# Patient Record
Sex: Male | Born: 1960
Health system: Southern US, Community
[De-identification: ages and names within clinical notes are randomized; demographics above are authoritative.]

## PROBLEM LIST (undated history)

## (undated) DIAGNOSIS — I10 Essential (primary) hypertension: Secondary | ICD-10-CM

## (undated) DIAGNOSIS — R109 Unspecified abdominal pain: Secondary | ICD-10-CM

## (undated) DIAGNOSIS — L84 Corns and callosities: Secondary | ICD-10-CM

## (undated) DIAGNOSIS — Z79899 Other long term (current) drug therapy: Secondary | ICD-10-CM

## (undated) DIAGNOSIS — IMO0002 Reserved for concepts with insufficient information to code with codable children: Secondary | ICD-10-CM

## (undated) DIAGNOSIS — K5792 Diverticulitis of intestine, part unspecified, without perforation or abscess without bleeding: Secondary | ICD-10-CM

## (undated) DIAGNOSIS — D72825 Bandemia: Secondary | ICD-10-CM

## (undated) DIAGNOSIS — H269 Unspecified cataract: Secondary | ICD-10-CM

## (undated) DIAGNOSIS — F1010 Alcohol abuse, uncomplicated: Secondary | ICD-10-CM

## (undated) DIAGNOSIS — M21611 Bunion of right foot: Secondary | ICD-10-CM

## (undated) DIAGNOSIS — M21612 Bunion of left foot: Secondary | ICD-10-CM

## (undated) DIAGNOSIS — A530 Latent syphilis, unspecified as early or late: Secondary | ICD-10-CM

## (undated) DIAGNOSIS — J309 Allergic rhinitis, unspecified: Secondary | ICD-10-CM

## (undated) DIAGNOSIS — R809 Proteinuria, unspecified: Secondary | ICD-10-CM

## (undated) DIAGNOSIS — I639 Cerebral infarction, unspecified: Principal | ICD-10-CM

## (undated) DIAGNOSIS — I4949 Other premature depolarization: Secondary | ICD-10-CM

## (undated) DIAGNOSIS — I219 Acute myocardial infarction, unspecified: Secondary | ICD-10-CM

## (undated) DIAGNOSIS — R011 Cardiac murmur, unspecified: Secondary | ICD-10-CM

## (undated) DIAGNOSIS — B353 Tinea pedis: Secondary | ICD-10-CM

## (undated) DIAGNOSIS — I63441 Cerebral infarction due to embolism of right cerebellar artery: Secondary | ICD-10-CM

## (undated) DIAGNOSIS — E7800 Pure hypercholesterolemia, unspecified: Secondary | ICD-10-CM

## (undated) DIAGNOSIS — I440 Atrioventricular block, first degree: Secondary | ICD-10-CM

## (undated) DIAGNOSIS — E291 Testicular hypofunction: Secondary | ICD-10-CM

## (undated) DIAGNOSIS — Z720 Tobacco use: Secondary | ICD-10-CM

## (undated) DIAGNOSIS — K922 Gastrointestinal hemorrhage, unspecified: Secondary | ICD-10-CM

## (undated) DIAGNOSIS — E119 Type 2 diabetes mellitus without complications: Secondary | ICD-10-CM

## (undated) DIAGNOSIS — E559 Vitamin D deficiency, unspecified: Secondary | ICD-10-CM

## (undated) DIAGNOSIS — B351 Tinea unguium: Secondary | ICD-10-CM

## (undated) HISTORY — DX: Cerebral infarction, unspecified: I63.9

## (undated) HISTORY — PX: PACEMAKER IMPLANT: EP1218

## (undated) HISTORY — DX: Tinea unguium: B35.1

## (undated) HISTORY — DX: Cardiac murmur, unspecified: R01.1

## (undated) HISTORY — DX: Atrioventricular block, first degree: I44.0

## (undated) HISTORY — DX: Latent syphilis, unspecified as early or late: A53.0

## (undated) HISTORY — DX: Pure hypercholesterolemia, unspecified: E78.00

## (undated) HISTORY — DX: Allergic rhinitis, unspecified: J30.9

## (undated) HISTORY — DX: Acute myocardial infarction, unspecified: I21.9

## (undated) HISTORY — DX: Bandemia: D72.825

## (undated) HISTORY — DX: Tinea pedis: B35.3

## (undated) HISTORY — DX: Other premature depolarization: I49.49

## (undated) HISTORY — DX: Corns and callosities: L84

## (undated) HISTORY — PX: BACK SURGERY: SHX140

## (undated) HISTORY — DX: Reserved for concepts with insufficient information to code with codable children: IMO0002

## (undated) HISTORY — DX: Diverticulitis of intestine, part unspecified, without perforation or abscess without bleeding: K57.92

## (undated) HISTORY — DX: Unspecified cataract: H26.9

## (undated) HISTORY — DX: Cerebral infarction due to embolism of right cerebellar artery: I63.441

## (undated) HISTORY — DX: Gastrointestinal hemorrhage, unspecified: K92.2

## (undated) HISTORY — DX: Testicular hypofunction: E29.1

## (undated) HISTORY — DX: Type 2 diabetes mellitus without complications: E11.9

## (undated) HISTORY — DX: Alcohol abuse, uncomplicated: F10.10

## (undated) HISTORY — DX: Other long term (current) drug therapy: Z79.899

## (undated) HISTORY — DX: Bunion of left foot: M21.612

## (undated) HISTORY — DX: Proteinuria, unspecified: R80.9

## (undated) HISTORY — DX: Bunion of right foot: M21.611

## (undated) HISTORY — DX: Unspecified abdominal pain: R10.9

## (undated) HISTORY — DX: Essential (primary) hypertension: I10

## (undated) HISTORY — DX: Tobacco use: Z72.0

## (undated) HISTORY — DX: Vitamin D deficiency, unspecified: E55.9

---

## 2011-03-25 HISTORY — PX: COLONOSCOPY: SHX174

## 2011-12-01 DIAGNOSIS — Z86010 Personal history of colonic polyps: Secondary | ICD-10-CM | POA: Insufficient documentation

## 2012-04-20 DIAGNOSIS — R011 Cardiac murmur, unspecified: Secondary | ICD-10-CM | POA: Insufficient documentation

## 2012-04-20 HISTORY — DX: Cardiac murmur, unspecified: R01.1

## 2012-08-28 DIAGNOSIS — I4949 Other premature depolarization: Secondary | ICD-10-CM

## 2012-08-28 DIAGNOSIS — I440 Atrioventricular block, first degree: Secondary | ICD-10-CM

## 2012-08-28 DIAGNOSIS — Z79899 Other long term (current) drug therapy: Secondary | ICD-10-CM

## 2012-08-28 DIAGNOSIS — J309 Allergic rhinitis, unspecified: Secondary | ICD-10-CM

## 2012-08-28 DIAGNOSIS — L84 Corns and callosities: Secondary | ICD-10-CM

## 2012-08-28 DIAGNOSIS — B353 Tinea pedis: Secondary | ICD-10-CM

## 2012-08-28 HISTORY — DX: Other long term (current) drug therapy: Z79.899

## 2012-08-28 HISTORY — DX: Allergic rhinitis, unspecified: J30.9

## 2012-08-28 HISTORY — DX: Tinea pedis: B35.3

## 2012-08-28 HISTORY — DX: Other premature depolarization: I49.49

## 2012-08-28 HISTORY — DX: Atrioventricular block, first degree: I44.0

## 2012-08-28 HISTORY — DX: Corns and callosities: L84

## 2013-04-13 DIAGNOSIS — E7800 Pure hypercholesterolemia, unspecified: Secondary | ICD-10-CM

## 2013-04-13 DIAGNOSIS — E291 Testicular hypofunction: Secondary | ICD-10-CM | POA: Insufficient documentation

## 2013-04-13 DIAGNOSIS — E559 Vitamin D deficiency, unspecified: Secondary | ICD-10-CM | POA: Insufficient documentation

## 2013-04-13 DIAGNOSIS — R809 Proteinuria, unspecified: Secondary | ICD-10-CM

## 2013-04-13 HISTORY — DX: Testicular hypofunction: E29.1

## 2013-04-13 HISTORY — DX: Pure hypercholesterolemia, unspecified: E78.00

## 2013-04-13 HISTORY — DX: Proteinuria, unspecified: R80.9

## 2013-04-13 HISTORY — DX: Vitamin D deficiency, unspecified: E55.9

## 2013-11-16 DIAGNOSIS — E291 Testicular hypofunction: Secondary | ICD-10-CM

## 2013-11-16 HISTORY — DX: Testicular hypofunction: E29.1

## 2014-01-03 DIAGNOSIS — H269 Unspecified cataract: Secondary | ICD-10-CM | POA: Insufficient documentation

## 2014-01-03 HISTORY — DX: Unspecified cataract: H26.9

## 2014-12-18 DIAGNOSIS — B351 Tinea unguium: Secondary | ICD-10-CM | POA: Insufficient documentation

## 2014-12-18 DIAGNOSIS — B353 Tinea pedis: Secondary | ICD-10-CM

## 2014-12-18 DIAGNOSIS — M21611 Bunion of right foot: Secondary | ICD-10-CM | POA: Insufficient documentation

## 2014-12-18 DIAGNOSIS — M21612 Bunion of left foot: Secondary | ICD-10-CM

## 2014-12-18 HISTORY — DX: Tinea unguium: B35.1

## 2014-12-18 HISTORY — DX: Tinea pedis: B35.3

## 2014-12-18 HISTORY — DX: Bunion of right foot: M21.611

## 2016-04-08 ENCOUNTER — Encounter (HOSPITAL_BASED_OUTPATIENT_CLINIC_OR_DEPARTMENT_OTHER): Payer: Self-pay | Admitting: Emergency Medicine

## 2016-04-08 ENCOUNTER — Emergency Department (HOSPITAL_BASED_OUTPATIENT_CLINIC_OR_DEPARTMENT_OTHER)
Admission: EM | Admit: 2016-04-08 | Discharge: 2016-04-08 | Disposition: A | Discharge status: Home / Self Care | Payer: Self-pay | Attending: Emergency Medicine | Admitting: Emergency Medicine

## 2016-04-08 DIAGNOSIS — R1030 Lower abdominal pain, unspecified: Secondary | ICD-10-CM | POA: Insufficient documentation

## 2016-04-08 DIAGNOSIS — Z79899 Other long term (current) drug therapy: Secondary | ICD-10-CM | POA: Insufficient documentation

## 2016-04-08 DIAGNOSIS — R109 Unspecified abdominal pain: Secondary | ICD-10-CM

## 2016-04-08 DIAGNOSIS — Z7984 Long term (current) use of oral hypoglycemic drugs: Secondary | ICD-10-CM | POA: Insufficient documentation

## 2016-04-08 DIAGNOSIS — E119 Type 2 diabetes mellitus without complications: Secondary | ICD-10-CM | POA: Insufficient documentation

## 2016-04-08 DIAGNOSIS — F17210 Nicotine dependence, cigarettes, uncomplicated: Secondary | ICD-10-CM | POA: Insufficient documentation

## 2016-04-08 DIAGNOSIS — I10 Essential (primary) hypertension: Secondary | ICD-10-CM | POA: Insufficient documentation

## 2016-04-08 HISTORY — DX: Type 2 diabetes mellitus without complications: E11.9

## 2016-04-08 HISTORY — DX: Essential (primary) hypertension: I10

## 2016-04-08 HISTORY — DX: Pure hypercholesterolemia, unspecified: E78.00

## 2016-04-08 LAB — COMPREHENSIVE METABOLIC PANEL
ALK PHOS: 104 U/L (ref 38–126)
ALT: 15 U/L — AB (ref 17–63)
AST: 18 U/L (ref 15–41)
Albumin: 4.1 g/dL (ref 3.5–5.0)
Anion gap: 11 (ref 5–15)
BILIRUBIN TOTAL: 0.6 mg/dL (ref 0.3–1.2)
BUN: 9 mg/dL (ref 6–20)
CALCIUM: 9.9 mg/dL (ref 8.9–10.3)
CO2: 23 mmol/L (ref 22–32)
Chloride: 100 mmol/L — ABNORMAL LOW (ref 101–111)
Creatinine, Ser: 0.88 mg/dL (ref 0.61–1.24)
GFR calc Af Amer: >60 mL/min (ref >60)
Glucose, Bld: 345 mg/dL — ABNORMAL HIGH (ref 65–99)
Potassium: 3.6 mmol/L (ref 3.5–5.1)
Sodium: 134 mmol/L — ABNORMAL LOW (ref 135–145)
Total Protein: 8.3 g/dL — ABNORMAL HIGH (ref 6.5–8.1)

## 2016-04-08 LAB — CBC
HCT: 44.6 % (ref 39.0–52.0)
Hemoglobin: 15.6 g/dL (ref 13.0–17.0)
MCH: 29.2 pg (ref 26.0–34.0)
MCHC: 35 g/dL (ref 30.0–36.0)
MCV: 83.4 fL (ref 78.0–100.0)
PLATELETS: 276 10*3/uL (ref 150–400)
RBC: 5.35 MIL/uL (ref 4.22–5.81)
RDW: 13.2 % (ref 11.5–15.5)
WBC: 9.2 10*3/uL (ref 4.0–10.5)

## 2016-04-08 LAB — URINALYSIS, ROUTINE W REFLEX MICROSCOPIC
BILIRUBIN URINE: NEGATIVE
Glucose, UA: >=500 mg/dL — AB
HGB URINE DIPSTICK: NEGATIVE
Ketones, ur: 15 mg/dL — AB
Leukocytes, UA: NEGATIVE
Nitrite: NEGATIVE
PH: 5.5 (ref 5.0–8.0)
Protein, ur: 30 mg/dL — AB

## 2016-04-08 LAB — URINALYSIS, MICROSCOPIC (REFLEX)
RBC / HPF: NONE SEEN RBC/hpf (ref 0–5)
WBC, UA: NONE SEEN WBC/hpf (ref 0–5)

## 2016-04-08 LAB — LIPASE, BLOOD: Lipase: 29 U/L (ref 11–51)

## 2016-04-08 MED ORDER — DICYCLOMINE HCL 20 MG PO TABS: 20.0000 mg | ORAL_TABLET | Freq: Two times a day (BID) | ORAL | 0 refills | Status: DC

## 2016-04-08 MED ORDER — DICYCLOMINE HCL 10 MG PO CAPS
10.0000 mg | ORAL_CAPSULE | Freq: Once | ORAL | Status: AC
  Administered 2016-04-08: 10 mg via ORAL
  Filled 2016-04-08: qty 1

## 2016-04-08 MED FILL — DICYCLOMINE 20 MG TABLET: 20 | 10 days supply | Qty: 20 | Fill #0

## 2016-04-08 NOTE — ED Provider Notes (Signed)
[facility] DEPT [facility] Provider Note   CSN: [id number] Arrival date & time: 04/08/16  1017   History   Chief Complaint Chief Complaint  Patient presents with  . Abdominal Pain    HPI Caleb Hernandez is a 56 y.o. male.  HPI   Patient has PMH of diabetes, high cholesterol and hypertension as well as of hx of back surgery to the ER for evaluation of abdominal pain. The pain has been intermittent lower abdominal the cramping pain is illicit ed only with laying on his left or right side. Patient is laying in stretcher with legs crossed changing the channels on the television while we talk. He says he is currently having no pain. It has been going on for the past 2 days. He has not been having any nausea, vomiting. Constipation, bloating, le swelling, back pain, testicular pain, weight loss, night sweats, abnormal bleeding or diarrhea. He has not been having any urinary symptoms.  Past Medical History:  Diagnosis Date  . Diabetes mellitus without complication ([place])   . High cholesterol   . Hypertension     There are no active problems to display for this patient.  Past Surgical History:  Procedure Laterality Date  . BACK SURGERY      Home Medications    Prior to Admission medications   Medication Sig Start Date End Date Taking? Authorizing Provider  metFORMIN (GLUCOPHAGE) 500 MG tablet Take 500 mg by mouth 2 (two) times daily with a meal.   Yes Historical Provider, MD  dicyclomine (BENTYL) 20 MG tablet Take 1 tablet (20 mg total) by mouth 2 (two) times daily. 04/08/16   Delos Haring, PA-C    Family History No family history on file.  Social History Social History  Substance Use Topics  . Smoking status: Current Every Day Smoker  . Smokeless tobacco: Never Used  . Alcohol use Yes     Comment: 40 oz per day   Allergies   Patient has no known allergies.   Review of Systems Review of Systems Review of Systems All other systems negative except as documented in the  HPI. All pertinent positives and negatives as reviewed in the HPI.   Physical Exam Updated Vital Signs BP 155/98 (BP Location: Left Arm)   Pulse 104   Temp 97.8 F (36.6 C) (Oral)   Resp 18   Ht 5\' 4"  (1.626 m)   Wt 71.7 kg   SpO2 97%   BMI 27.12 kg/m   Physical Exam  Constitutional: He is oriented to person, place, and time. He appears well-developed and well-nourished.  HENT:  Head: Normocephalic and atraumatic.  Eyes: EOM are normal. Pupils are equal, round, and reactive to light.  Neck: Normal range of motion.  Cardiovascular: Normal rate and regular rhythm.   Pulmonary/Chest: Effort normal and breath sounds normal.  Abdominal: Soft. Normal appearance and bowel sounds are normal. There is no tenderness. There is no rigidity, no rebound, no guarding, no CVA tenderness, no tenderness at McBurney's point and negative Murphy's sign.  Musculoskeletal: Normal range of motion.  Neurological: He is alert and oriented to person, place, and time.  Skin: Skin is warm and dry.    ED Treatments / Results  Labs (all labs ordered are listed, but only abnormal results are displayed) Labs Reviewed  URINALYSIS, ROUTINE W REFLEX MICROSCOPIC - Abnormal; Notable for the following:       Result Value   Specific Gravity, Urine >1.046 (*)    Glucose, UA >=500 (*)  Ketones, ur 15 (*)    Protein, ur 30 (*)    All other components within normal limits  COMPREHENSIVE METABOLIC PANEL - Abnormal; Notable for the following:    Sodium 134 (*)    Chloride 100 (*)    Glucose, Bld 345 (*)    Total Protein 8.3 (*)    ALT 15 (*)    All other components within normal limits  URINALYSIS, MICROSCOPIC (REFLEX) - Abnormal; Notable for the following:    Bacteria, UA RARE (*)    Squamous Epithelial / LPF 0-5 (*)    All other components within normal limits  LIPASE, BLOOD  CBC    EKG  EKG Interpretation None       Radiology No results found.  Procedures Procedures (including critical care  time)  Medications Ordered in ED Medications  dicyclomine (BENTYL) capsule 10 mg (10 mg Oral Given 04/08/16 1101)     Initial Impression / Assessment and Plan / ED Course  I have reviewed the triage vital signs and the nursing notes.  Pertinent labs & imaging results that were available during my care of the patient were reviewed by me and considered in my medical decision making (see chart for details).  Clinical Course     Patient is nontoxic, nonseptic appearing, in no apparent distress.  Patient's pain and other symptoms adequately managed in emergency department.  Labs, imaging and vitals reviewed. I do not feel that the patient needs imaging at this time, he is not currently having any pain. Patient does not meet the SIRS or Sepsis criteria.  On repeat exam patient does not have a surgical abdomin and there are no peritoneal signs.  No indication of appendicitis, bowel obstruction, bowel perforation, cholecystitis, diverticulitis.  Patient discharged home with symptomatic treatment and given strict instructions for follow-up with their primary care physician.  I have also discussed reasons to return immediately to the ER.  Patient expresses understanding and agrees with plan.     Final Clinical Impressions(s) / ED Diagnoses   Final diagnoses:  Abdominal cramping    New Prescriptions New Prescriptions   DICYCLOMINE (BENTYL) 20 MG TABLET    Take 1 tablet (20 mg total) by mouth 2 (two) times daily.     Delos Haring, PA-C 04/08/16 [place], MD 04/08/16 [phone]

## 2016-04-08 NOTE — ED Triage Notes (Signed)
Pt reports intermittent lower abd pain x 3 days. Denies N/V/D. Denies urinary symptoms.

## 2016-09-17 ENCOUNTER — Inpatient Hospital Stay (HOSPITAL_COMMUNITY): Payer: Self-pay

## 2016-09-17 ENCOUNTER — Inpatient Hospital Stay (HOSPITAL_BASED_OUTPATIENT_CLINIC_OR_DEPARTMENT_OTHER)
Admission: EM | Admit: 2016-09-17 | Discharge: 2016-09-20 | DRG: 392 | Disposition: A | Discharge status: Home / Self Care | Payer: Self-pay | Attending: Internal Medicine | Admitting: Internal Medicine

## 2016-09-17 ENCOUNTER — Encounter (HOSPITAL_BASED_OUTPATIENT_CLINIC_OR_DEPARTMENT_OTHER): Payer: Self-pay | Admitting: Emergency Medicine

## 2016-09-17 ENCOUNTER — Emergency Department (HOSPITAL_BASED_OUTPATIENT_CLINIC_OR_DEPARTMENT_OTHER): Payer: Self-pay

## 2016-09-17 DIAGNOSIS — K922 Gastrointestinal hemorrhage, unspecified: Secondary | ICD-10-CM | POA: Diagnosis present

## 2016-09-17 DIAGNOSIS — R109 Unspecified abdominal pain: Secondary | ICD-10-CM | POA: Diagnosis present

## 2016-09-17 DIAGNOSIS — K5792 Diverticulitis of intestine, part unspecified, without perforation or abscess without bleeding: Secondary | ICD-10-CM

## 2016-09-17 DIAGNOSIS — Z79899 Other long term (current) drug therapy: Secondary | ICD-10-CM

## 2016-09-17 DIAGNOSIS — Z720 Tobacco use: Secondary | ICD-10-CM

## 2016-09-17 DIAGNOSIS — K921 Melena: Secondary | ICD-10-CM | POA: Diagnosis present

## 2016-09-17 DIAGNOSIS — I4510 Unspecified right bundle-branch block: Secondary | ICD-10-CM | POA: Diagnosis present

## 2016-09-17 DIAGNOSIS — I10 Essential (primary) hypertension: Secondary | ICD-10-CM | POA: Diagnosis present

## 2016-09-17 DIAGNOSIS — Z794 Long term (current) use of insulin: Secondary | ICD-10-CM

## 2016-09-17 DIAGNOSIS — K5793 Diverticulitis of intestine, part unspecified, without perforation or abscess with bleeding: Secondary | ICD-10-CM

## 2016-09-17 DIAGNOSIS — E876 Hypokalemia: Secondary | ICD-10-CM | POA: Diagnosis present

## 2016-09-17 DIAGNOSIS — F1010 Alcohol abuse, uncomplicated: Secondary | ICD-10-CM

## 2016-09-17 DIAGNOSIS — F17210 Nicotine dependence, cigarettes, uncomplicated: Secondary | ICD-10-CM | POA: Diagnosis present

## 2016-09-17 DIAGNOSIS — R066 Hiccough: Secondary | ICD-10-CM

## 2016-09-17 DIAGNOSIS — E118 Type 2 diabetes mellitus with unspecified complications: Secondary | ICD-10-CM

## 2016-09-17 DIAGNOSIS — I498 Other specified cardiac arrhythmias: Secondary | ICD-10-CM

## 2016-09-17 DIAGNOSIS — R1084 Generalized abdominal pain: Secondary | ICD-10-CM

## 2016-09-17 DIAGNOSIS — K5732 Diverticulitis of large intestine without perforation or abscess without bleeding: Principal | ICD-10-CM | POA: Diagnosis present

## 2016-09-17 DIAGNOSIS — K7010 Alcoholic hepatitis without ascites: Secondary | ICD-10-CM | POA: Diagnosis present

## 2016-09-17 DIAGNOSIS — D696 Thrombocytopenia, unspecified: Secondary | ICD-10-CM | POA: Diagnosis present

## 2016-09-17 DIAGNOSIS — K769 Liver disease, unspecified: Secondary | ICD-10-CM

## 2016-09-17 DIAGNOSIS — R740 Nonspecific elevation of levels of transaminase and lactic acid dehydrogenase [LDH]: Secondary | ICD-10-CM

## 2016-09-17 DIAGNOSIS — E119 Type 2 diabetes mellitus without complications: Secondary | ICD-10-CM

## 2016-09-17 DIAGNOSIS — R7401 Elevation of levels of liver transaminase levels: Secondary | ICD-10-CM

## 2016-09-17 DIAGNOSIS — I441 Atrioventricular block, second degree: Secondary | ICD-10-CM | POA: Diagnosis present

## 2016-09-17 DIAGNOSIS — K5733 Diverticulitis of large intestine without perforation or abscess with bleeding: Secondary | ICD-10-CM

## 2016-09-17 DIAGNOSIS — R112 Nausea with vomiting, unspecified: Secondary | ICD-10-CM

## 2016-09-17 HISTORY — DX: Alcohol abuse, uncomplicated: F10.10

## 2016-09-17 HISTORY — DX: Type 2 diabetes mellitus without complications: E11.9

## 2016-09-17 HISTORY — DX: Diverticulitis of intestine, part unspecified, without perforation or abscess without bleeding: K57.92

## 2016-09-17 HISTORY — DX: Gastrointestinal hemorrhage, unspecified: K92.2

## 2016-09-17 HISTORY — DX: Tobacco use: Z72.0

## 2016-09-17 HISTORY — DX: Essential (primary) hypertension: I10

## 2016-09-17 HISTORY — DX: Unspecified abdominal pain: R10.9

## 2016-09-17 LAB — APTT: aPTT: 38 seconds — ABNORMAL HIGH (ref 24–36)

## 2016-09-17 LAB — PROTIME-INR
INR: 1.33
Prothrombin Time: 16.6 seconds — ABNORMAL HIGH (ref 11.4–15.2)

## 2016-09-17 LAB — CBC WITH DIFFERENTIAL/PLATELET
Basophils Absolute: 0 10*3/uL (ref 0.0–0.1)
Basophils Relative: 0 %
Eosinophils Absolute: 0 10*3/uL (ref 0.0–0.7)
Eosinophils Relative: 0 %
HEMATOCRIT: 42.4 % (ref 39.0–52.0)
HEMOGLOBIN: 14.9 g/dL (ref 13.0–17.0)
LYMPHS ABS: 0.5 10*3/uL — AB (ref 0.7–4.0)
LYMPHS PCT: 4 %
MCH: 30.2 pg (ref 26.0–34.0)
MCHC: 35.1 g/dL (ref 30.0–36.0)
MCV: 85.8 fL (ref 78.0–100.0)
Monocytes Absolute: 0.3 10*3/uL (ref 0.1–1.0)
Monocytes Relative: 2 %
NEUTROS ABS: 12 10*3/uL — AB (ref 1.7–7.7)
Neutrophils Relative %: 94 %
Platelets: 132 10*3/uL — ABNORMAL LOW (ref 150–400)
RBC: 4.94 MIL/uL (ref 4.22–5.81)
RDW: 13.3 % (ref 11.5–15.5)
WBC: 12.9 10*3/uL — AB (ref 4.0–10.5)

## 2016-09-17 LAB — GLUCOSE, CAPILLARY
GLUCOSE-CAPILLARY: 120 mg/dL — AB (ref 65–99)
Glucose-Capillary: 144 mg/dL — ABNORMAL HIGH (ref 65–99)
Glucose-Capillary: 191 mg/dL — ABNORMAL HIGH (ref 65–99)

## 2016-09-17 LAB — I-STAT CG4 LACTIC ACID, ED: LACTIC ACID, VENOUS: 2.08 mmol/L — AB (ref 0.5–1.9)

## 2016-09-17 LAB — COMPREHENSIVE METABOLIC PANEL
ALK PHOS: 99 U/L (ref 38–126)
ALT: 1354 U/L — AB (ref 17–63)
AST: 2144 U/L — AB (ref 15–41)
Albumin: 3.7 g/dL (ref 3.5–5.0)
Anion gap: 12 (ref 5–15)
BILIRUBIN TOTAL: 2.9 mg/dL — AB (ref 0.3–1.2)
BUN: 15 mg/dL (ref 6–20)
CALCIUM: 8.8 mg/dL — AB (ref 8.9–10.3)
CO2: 21 mmol/L — ABNORMAL LOW (ref 22–32)
CREATININE: 1.01 mg/dL (ref 0.61–1.24)
Chloride: 102 mmol/L (ref 101–111)
GFR calc Af Amer: >60 mL/min (ref >60)
GFR calc non Af Amer: >60 mL/min (ref >60)
GLUCOSE: 159 mg/dL — AB (ref 65–99)
POTASSIUM: 2.9 mmol/L — AB (ref 3.5–5.1)
Sodium: 135 mmol/L (ref 135–145)
TOTAL PROTEIN: 6.9 g/dL (ref 6.5–8.1)

## 2016-09-17 LAB — LIPASE, BLOOD: Lipase: 20 U/L (ref 11–51)

## 2016-09-17 LAB — ACETAMINOPHEN LEVEL

## 2016-09-17 LAB — OCCULT BLOOD X 1 CARD TO LAB, STOOL: FECAL OCCULT BLD: POSITIVE — AB

## 2016-09-17 LAB — MAGNESIUM: MAGNESIUM: 1.6 mg/dL — AB (ref 1.7–2.4)

## 2016-09-17 LAB — POTASSIUM: Potassium: 3.4 mmol/L — ABNORMAL LOW (ref 3.5–5.1)

## 2016-09-17 MED ORDER — HYDROMORPHONE HCL 1 MG/ML IJ SOLN: 1.0000 mg | INTRAMUSCULAR | Status: AC | PRN

## 2016-09-17 MED ORDER — MAGNESIUM OXIDE 400 (241.3 MG) MG PO TABS
800.0000 mg | ORAL_TABLET | Freq: Two times a day (BID) | ORAL | Status: AC
  Administered 2016-09-17 (×2): 800 mg via ORAL
  Filled 2016-09-17 (×2): qty 2

## 2016-09-17 MED ORDER — FOLIC ACID 1 MG PO TABS
1.0000 mg | ORAL_TABLET | Freq: Every day | ORAL | Status: DC
  Administered 2016-09-17 – 2016-09-20 (×4): 1 mg via ORAL
  Filled 2016-09-17 (×4): qty 1

## 2016-09-17 MED ORDER — INSULIN ASPART 100 UNIT/ML ~~LOC~~ SOLN
0.0000 [IU] | Freq: Three times a day (TID) | SUBCUTANEOUS | Status: DC
Start: 2016-09-17 — End: 2016-09-20
  Administered 2016-09-17: 3 [IU] via SUBCUTANEOUS
  Administered 2016-09-17 – 2016-09-19 (×4): 2 [IU] via SUBCUTANEOUS

## 2016-09-17 MED ORDER — POTASSIUM CHLORIDE 10 MEQ/100ML IV SOLN
10.0000 meq | INTRAVENOUS | Status: AC
  Administered 2016-09-17 (×4): 10 meq via INTRAVENOUS
  Filled 2016-09-17 (×4): qty 100

## 2016-09-17 MED ORDER — POTASSIUM CHLORIDE CRYS ER 20 MEQ PO TBCR: 40.0000 meq | EXTENDED_RELEASE_TABLET | Freq: Once | ORAL | Status: DC

## 2016-09-17 MED ORDER — PANTOPRAZOLE SODIUM 40 MG PO TBEC
40.0000 mg | DELAYED_RELEASE_TABLET | Freq: Two times a day (BID) | ORAL | Status: DC
  Administered 2016-09-17 – 2016-09-20 (×6): 40 mg via ORAL
  Filled 2016-09-17 (×6): qty 1

## 2016-09-17 MED ORDER — ACETAMINOPHEN 650 MG RE SUPP: 650.0000 mg | Freq: Four times a day (QID) | RECTAL | Status: DC | PRN

## 2016-09-17 MED ORDER — INSULIN ASPART 100 UNIT/ML ~~LOC~~ SOLN: 0.0000 [IU] | Freq: Every day | SUBCUTANEOUS | Status: DC

## 2016-09-17 MED ORDER — SODIUM CHLORIDE 0.9 % IV SOLN
INTRAVENOUS | Status: DC
  Administered 2016-09-17 – 2016-09-20 (×5): via INTRAVENOUS

## 2016-09-17 MED ORDER — IOPAMIDOL (ISOVUE-300) INJECTION 61%
100.0000 mL | Freq: Once | INTRAVENOUS | Status: AC | PRN
  Administered 2016-09-17: 100 mL via INTRAVENOUS

## 2016-09-17 MED ORDER — METRONIDAZOLE IN NACL 5-0.79 MG/ML-% IV SOLN
500.0000 mg | Freq: Once | INTRAVENOUS | Status: AC
  Administered 2016-09-17: 500 mg via INTRAVENOUS
  Filled 2016-09-17: qty 100

## 2016-09-17 MED ORDER — SODIUM CHLORIDE 0.9 % IV BOLUS (SEPSIS)
1000.0000 mL | Freq: Once | INTRAVENOUS | Status: AC
  Administered 2016-09-17: 1000 mL via INTRAVENOUS

## 2016-09-17 MED ORDER — CHLORPROMAZINE HCL 25 MG PO TABS
25.0000 mg | ORAL_TABLET | Freq: Once | ORAL | Status: AC
  Administered 2016-09-17: 25 mg via ORAL
  Filled 2016-09-17: qty 1

## 2016-09-17 MED ORDER — THIAMINE HCL 100 MG/ML IJ SOLN: 100.0000 mg | Freq: Every day | INTRAMUSCULAR | Status: DC

## 2016-09-17 MED ORDER — LORAZEPAM 1 MG PO TABS: 1.0000 mg | ORAL_TABLET | Freq: Four times a day (QID) | ORAL | Status: AC | PRN

## 2016-09-17 MED ORDER — MORPHINE SULFATE (PF) 4 MG/ML IV SOLN
4.0000 mg | Freq: Once | INTRAVENOUS | Status: AC
  Administered 2016-09-17: 4 mg via INTRAVENOUS
  Filled 2016-09-17: qty 1

## 2016-09-17 MED ORDER — ADULT MULTIVITAMIN W/MINERALS CH
1.0000 | ORAL_TABLET | Freq: Every day | ORAL | Status: DC
  Administered 2016-09-18 – 2016-09-20 (×3): 1 via ORAL
  Filled 2016-09-17 (×4): qty 1

## 2016-09-17 MED ORDER — POTASSIUM CHLORIDE CRYS ER 20 MEQ PO TBCR
40.0000 meq | EXTENDED_RELEASE_TABLET | Freq: Once | ORAL | Status: AC
Start: 2016-09-17 — End: 2016-09-17
  Administered 2016-09-17: 40 meq via ORAL
  Filled 2016-09-17: qty 2

## 2016-09-17 MED ORDER — ACETAMINOPHEN 325 MG PO TABS
650.0000 mg | ORAL_TABLET | Freq: Four times a day (QID) | ORAL | Status: DC | PRN
  Administered 2016-09-17 – 2016-09-18 (×2): 650 mg via ORAL
  Filled 2016-09-17 (×2): qty 2

## 2016-09-17 MED ORDER — VITAMIN B-1 100 MG PO TABS
100.0000 mg | ORAL_TABLET | Freq: Every day | ORAL | Status: DC
  Administered 2016-09-17 – 2016-09-20 (×4): 100 mg via ORAL
  Filled 2016-09-17 (×4): qty 1

## 2016-09-17 MED ORDER — CIPROFLOXACIN IN D5W 400 MG/200ML IV SOLN
400.0000 mg | Freq: Two times a day (BID) | INTRAVENOUS | Status: DC
  Administered 2016-09-17 – 2016-09-20 (×7): 400 mg via INTRAVENOUS
  Filled 2016-09-17 (×7): qty 200

## 2016-09-17 MED ORDER — CHLORPROMAZINE HCL 25 MG PO TABS
25.0000 mg | ORAL_TABLET | Freq: Three times a day (TID) | ORAL | Status: DC | PRN
  Administered 2016-09-17: 25 mg via ORAL
  Filled 2016-09-17: qty 1

## 2016-09-17 MED ORDER — METRONIDAZOLE IN NACL 5-0.79 MG/ML-% IV SOLN
500.0000 mg | Freq: Three times a day (TID) | INTRAVENOUS | Status: DC
  Administered 2016-09-17 – 2016-09-20 (×10): 500 mg via INTRAVENOUS
  Filled 2016-09-17 (×11): qty 100

## 2016-09-17 MED ORDER — LORAZEPAM 2 MG/ML IJ SOLN: 1.0000 mg | Freq: Four times a day (QID) | INTRAMUSCULAR | Status: AC | PRN

## 2016-09-17 MED ORDER — DEXTROSE 5 % IV SOLN
2.0000 g | Freq: Once | INTRAVENOUS | Status: AC
  Administered 2016-09-17: 2 g via INTRAVENOUS
  Filled 2016-09-17: qty 2

## 2016-09-17 MED ORDER — METOCLOPRAMIDE HCL 5 MG/ML IJ SOLN
10.0000 mg | Freq: Once | INTRAMUSCULAR | Status: AC
  Administered 2016-09-17: 10 mg via INTRAVENOUS
  Filled 2016-09-17: qty 2

## 2016-09-17 MED ORDER — LISINOPRIL 20 MG PO TABS
20.0000 mg | ORAL_TABLET | Freq: Every day | ORAL | Status: DC
  Administered 2016-09-17 – 2016-09-19 (×3): 20 mg via ORAL
  Filled 2016-09-17 (×3): qty 1

## 2016-09-17 NOTE — ED Provider Notes (Addendum)
[facility] DEPT [facility] Provider Note: Georgena Spurling, MD, FACEP  CSN: [id number] MRN: [id number] ARRIVAL: 09/17/16 at [place]: 1435/1435-01   CHIEF COMPLAINT  Hiccups   HISTORY OF PRESENT ILLNESS  Caleb Hernandez is a 56 y.o. male with a history of diabetes. He is here with abdominal pain that began yesterday evening. The pain is located in the periumbilical and epigastric regions. He rates it as a 9 out of 10. It is worse with movement or palpation. He describes it as a dull constant pain. He is here now with hiccups since about midnight. The hiccups are persistent and constant. They have caused him to vomit about 4 times. He has also had diarrhea with gross blood in his stools.   Past Medical History:  Diagnosis Date  . Diabetes mellitus without complication ([place])   . High cholesterol   . Hypertension     Past Surgical History:  Procedure Laterality Date  . BACK SURGERY      No family history on file.  Social History  Substance Use Topics  . Smoking status: Current Every Day Smoker  . Smokeless tobacco: Never Used  . Alcohol use Yes     Comment: 40 oz per day    Prior to Admission medications   Medication Sig Start Date End Date Taking? Authorizing Provider  lisinopril (PRINIVIL,ZESTRIL) 20 MG tablet Take 20 mg by mouth daily.   Yes [provider]  dicyclomine (BENTYL) 20 MG tablet Take 1 tablet (20 mg total) by mouth 2 (two) times daily. 04/08/16   Delos Haring, PA-C  metFORMIN (GLUCOPHAGE) 500 MG tablet Take 500 mg by mouth 2 (two) times daily with a meal.    [provider]    Allergies Patient has no known allergies.   REVIEW OF SYSTEMS  Negative except as noted here or in the History of Present Illness.   PHYSICAL EXAMINATION  Initial Vital Signs Blood pressure (!) 153/84, pulse 96, temperature 98.3 F (36.8 C), temperature source Oral, resp. rate 18, height 5' 4.5" (1.638 m), weight 74.8 kg (165 lb), SpO2 98  %.  Examination General: Well-developed, well-nourished male in no acute distress; appearance consistent with age of record HENT: normocephalic; atraumatic; poor dentition with several loose teeth Eyes: pupils equal, round and reactive to light; extraocular muscles intact; scleral icterus Neck: supple Heart: regular rate and rhythm Lungs: clear to auscultation bilaterally; frequent hiccups Abdomen: soft; nondistended; epigastric and periumbilical tenderness; no masses or hepatosplenomegaly; bowel sounds present Rectal: Normal sphincter tone; no formed stool in vault; gross blood on examining glove Extremities: No deformity; full range of motion; pulses normal Neurologic: Awake, alert and oriented; motor function intact in all extremities and symmetric; no facial droop Skin: Warm and dry Psychiatric: Flat affect   RESULTS  Summary of this visit's results, reviewed by myself:   EKG Interpretation  Date/Time:  Wednesday September 17 2016 05:59:39 EDT Ventricular Rate:  96 PR Interval:    QRS Duration: 92 QT Interval:  381 QTC Calculation: 482 R Axis:   84 Text Interpretation:  Accelerated junctional rhythm Borderline prolonged QT interval Abnormal ECG No previous ECGs available Confirmed by Dorleen Kissel, Jenny Reichmann [phone]) on 09/17/2016 6:02:17 AM Also confirmed by Shanon Rosser [phone]), editor Hattie Perch ([id number])  on 09/17/2016 6:55:45 AM      Laboratory Studies: Results for orders placed or performed during the hospital encounter of 09/17/16 (from the past 24 hour(s))  Occult blood card to lab, stool Provider will collect     Status:  Abnormal   Collection Time: 09/17/16  4:40 AM  Result Value Ref Range   Fecal Occult Bld POSITIVE (A) NEGATIVE  CBC with Differential/Platelet     Status: Abnormal   Collection Time: 09/17/16  4:49 AM  Result Value Ref Range   WBC 12.9 (H) 4.0 - 10.5 K/uL   RBC 4.94 4.22 - 5.81 MIL/uL   Hemoglobin 14.9 13.0 - 17.0 g/dL   HCT 42.4 39.0 - 52.0 %   MCV 85.8  78.0 - 100.0 fL   MCH 30.2 26.0 - 34.0 pg   MCHC 35.1 30.0 - 36.0 g/dL   RDW 13.3 11.5 - 15.5 %   Platelets 132 (L) 150 - 400 K/uL   Neutrophils Relative % 94 %   Neutro Abs 12.0 (H) 1.7 - 7.7 K/uL   Lymphocytes Relative 4 %   Lymphs Abs 0.5 (L) 0.7 - 4.0 K/uL   Monocytes Relative 2 %   Monocytes Absolute 0.3 0.1 - 1.0 K/uL   Eosinophils Relative 0 %   Eosinophils Absolute 0.0 0.0 - 0.7 K/uL   Basophils Relative 0 %   Basophils Absolute 0.0 0.0 - 0.1 K/uL  Lipase, blood     Status: None   Collection Time: 09/17/16  4:49 AM  Result Value Ref Range   Lipase 20 11 - 51 U/L  Comprehensive metabolic panel     Status: Abnormal   Collection Time: 09/17/16  4:49 AM  Result Value Ref Range   Sodium 135 135 - 145 mmol/L   Potassium 2.9 (L) 3.5 - 5.1 mmol/L   Chloride 102 101 - 111 mmol/L   CO2 21 (L) 22 - 32 mmol/L   Glucose, Bld 159 (H) 65 - 99 mg/dL   BUN 15 6 - 20 mg/dL   Creatinine, Ser 1.01 0.61 - 1.24 mg/dL   Calcium 8.8 (L) 8.9 - 10.3 mg/dL   Total Protein 6.9 6.5 - 8.1 g/dL   Albumin 3.7 3.5 - 5.0 g/dL   AST 2,144 (H) 15 - 41 U/L   ALT 1,354 (H) 17 - 63 U/L   Alkaline Phosphatase 99 38 - 126 U/L   Total Bilirubin 2.9 (H) 0.3 - 1.2 mg/dL   GFR calc non Af Amer >60 >60 mL/min   GFR calc Af Amer >60 >60 mL/min   Anion gap 12 5 - 15  APTT     Status: Abnormal   Collection Time: 09/17/16  6:12 AM  Result Value Ref Range   aPTT 38 (H) 24 - 36 seconds  Protime-INR     Status: Abnormal   Collection Time: 09/17/16  6:12 AM  Result Value Ref Range   Prothrombin Time 16.6 (H) 11.4 - 15.2 seconds   INR 1.33   Acetaminophen level     Status: Abnormal   Collection Time: 09/17/16  7:10 AM  Result Value Ref Range   Acetaminophen (Tylenol), Serum <10 (L) 10 - 30 ug/mL  I-Stat CG4 Lactic Acid, ED     Status: Abnormal   Collection Time: 09/17/16  7:26 AM  Result Value Ref Range   Lactic Acid, Venous 2.08 (HH) 0.5 - 1.9 mmol/L   Comment NOTIFIED PHYSICIAN    Imaging Studies: Ct  Abdomen Pelvis W Contrast  Result Date: 09/17/2016 CLINICAL DATA:  Epigastric pain for 2 days.  Leukocytosis. EXAM: CT ABDOMEN AND PELVIS WITH CONTRAST TECHNIQUE: Multidetector CT imaging of the abdomen and pelvis was performed using the standard protocol following bolus administration of intravenous contrast. CONTRAST:  162mL ISOVUE-300 IOPAMIDOL (  ISOVUE-300) INJECTION 61% COMPARISON:  None. FINDINGS: Lower chest: No acute abnormality. Hepatobiliary: Low-attenuation in the left lobe of the liver may represent transient hepatic attenuation changes. Additional scattered low-attenuation foci are indeterminate but more likely benign. Gallbladder and bile ducts are unremarkable. Pancreas: Unremarkable. No pancreatic ductal dilatation or surrounding inflammatory changes. Spleen: Normal in size without focal abnormality. Adrenals/Urinary Tract: Adrenal glands are unremarkable. Kidneys are normal, without renal calculi, focal lesion, or hydronephrosis. Bladder is unremarkable. Stomach/Bowel: Colonic diverticulosis. Acute inflammation of the hepatic flexure is centered on a diverticulum and probably represents acute diverticulitis. Appendix is normal. Stomach and small bowel are unremarkable. Vascular/Lymphatic: The abdominal aorta is normal in caliber with moderate atherosclerotic calcification. No adenopathy in the abdomen or pelvis. Reproductive: Unremarkable Other: No ascites. Musculoskeletal: No significant skeletal lesion. IMPRESSION: 1. Right hemicolon diverticulitis in the hepatic flexure. 2. Hepatic hypodensities are indeterminate but more likely benign. MRI should be considered for conclusive characterization. 3. Aortic atherosclerosis. Electronically Signed   By: Andreas Newport M.D.   On: 09/17/2016 06:11    ED COURSE  Nursing notes and initial vitals signs, including pulse oximetry, reviewed.  Vitals:   09/17/16 0800 09/17/16 0830 09/17/16 0935 09/17/16 0942  BP: (!) 140/99 (!) 155/95  (!) 146/86   Pulse: 93 96  87  Resp: (!) 29 (!) 25  (!) 22  Temp:    98.2 F (36.8 C)  TempSrc:    Oral  SpO2: 93% 96%  100%  Weight:   71.8 kg (158 lb 4.6 oz)   Height:   5\' 5"  (1.651 m)    6:19 AM Pickups relief with IV Reglan. Potassium repletion ordered for hyperkalemia. Antibiotics ordered for acute diverticulitis. CT changes in the liver and elevated LFTs are suspicious for acute hepatic injury.  7:00 AM Discussed with Dr. Hal Hope of hospitalist service and Dr. Oletta Lamas of gastroenterology. Dr. Oletta Lamas will consult and request that the hospitalist admit.  PROCEDURES   CRITICAL CARE Performed by: Shanon Rosser L Total critical care time: 35 minutes Critical care time was exclusive of separately billable procedures and treating other patients. Critical care was necessary to treat or prevent imminent or life-threatening deterioration. Critical care was time spent personally by me on the following activities: development of treatment plan with patient and/or surrogate as well as nursing, discussions with consultants, evaluation of patient's response to treatment, examination of patient, obtaining history from patient or surrogate, ordering and performing treatments and interventions, ordering and review of laboratory studies, ordering and review of radiographic studies, pulse oximetry and re-evaluation of patient's condition.   ED DIAGNOSES     ICD-10-CM   1. Diverticulitis of colon with bleeding K57.33   2. Hypokalemia E87.6   3. Accelerated junctional rhythm I49.8   4. Hiccoughs R06.6   5. Nausea and vomiting in adult R11.2   6. Hepatic damage K76.9        Malon Siddall, Jenny Reichmann, MD 09/17/16 [id number]    Shanon Rosser, MD 09/17/16 1044

## 2016-09-17 NOTE — ED Notes (Signed)
Chaperoned MD for rectal exam. Pt had gross blood from rectum. Pt states he noticed blood in his stool tonight.

## 2016-09-17 NOTE — Consult Note (Signed)
[facility] Consult Note  Referring Provider: No ref. provider found Primary Care Physician:  Patient, No Pcp Per Primary Gastroenterologist:  Dr.  Laurel Dimmer Complaint: Abdominal pain nausea and vomiting followed by rectal bleeding HPI: Caleb Hernandez is an 56 y.o. black male  who presents with initial onset of nonbloody nausea and vomiting 2 nights ago with mild epigastric pain with vomiting persisting and the pain worsening and yesterday. Then last night he began no Sinnott a few cups and had a bloody bowel movement and states he has had through 4 since then up until this morning. Interestingly he just said that he flushed a bowel movement which appeared normal with no blood at this point. He is currently not nauseated in his pain level is mild and centered in the epigastrium but he is recently received pain medicine. He denies any fever. He admits to 2 or 3 beers a day but told another provider 6. He also takes about 4 Advil a day. He had a WBC count of 12,900, hemoglobin 14.9 AST 2144 ALT 1354 bilirubin 2.9 alkaline phosphatase normal, lactic acid 2.08. CT scan showed some low attenuation areas of liver there were felt nonspecific and likely benign. Pancreas bile ducts and gallbladder appeared normal. There was inflammation around the diverticulum near the hepatic flexure consistent with diverticulitis. Patient states she's had a colonoscopy about 5 years ago [place] which showed only 2 polyps.  Past Medical History:  Diagnosis Date  . Diabetes mellitus without complication ([place])   . High cholesterol   . Hypertension     Past Surgical History:  Procedure Laterality Date  . BACK SURGERY      Medications Prior to Admission  Medication Sig Dispense Refill  . lisinopril (PRINIVIL,ZESTRIL) 20 MG tablet Take 20 mg by mouth daily.    . metFORMIN (GLUCOPHAGE) 500 MG tablet Take 500 mg by mouth daily.       Allergies: No Known Allergies  History reviewed. No pertinent family  history.  Social History:  reports that he has been smoking.  He has never used smokeless tobacco. He reports that he drinks alcohol. He reports that he does not use drugs.  Review of Systems: negative except As above   Blood pressure 136/84, pulse (!) 102, temperature 100.3 F (37.9 C), temperature source Oral, resp. rate (!) 22, height '5\' 5"'$  (1.651 m), weight 71.8 kg (158 lb 4.6 oz), SpO2 96 %. Head: Normocephalic, without obvious abnormality, atraumatic Neck: no adenopathy, no carotid bruit, no JVD, supple, symmetrical, trachea midline and thyroid not enlarged, symmetric, no tenderness/mass/nodules Resp: clear to auscultation bilaterally Cardio: regular rate and rhythm, S1, S2 normal, no murmur, click, rub or gallop GI: Abdomen relatively soft with mild tenderness in the epigastrium, no guarding or rebound Extremities: extremities normal, atraumatic, no cyanosis or edema  Results for orders placed or performed during the hospital encounter of 09/17/16 (from the past 48 hour(s))  Occult blood card to lab, stool Provider will collect     Status: Abnormal   Collection Time: 09/17/16  4:40 AM  Result Value Ref Range   Fecal Occult Bld POSITIVE (A) NEGATIVE  CBC with Differential/Platelet     Status: Abnormal   Collection Time: 09/17/16  4:49 AM  Result Value Ref Range   WBC 12.9 (H) 4.0 - 10.5 K/uL   RBC 4.94 4.22 - 5.81 MIL/uL   Hemoglobin 14.9 13.0 - 17.0 g/dL   HCT 42.4 39.0 - 52.0 %   MCV 85.8 78.0 - 100.0 fL  MCH 30.2 26.0 - 34.0 pg   MCHC 35.1 30.0 - 36.0 g/dL   RDW 99.6 79.9 - 61.5 %   Platelets 132 (L) 150 - 400 K/uL   Neutrophils Relative % 94 %   Neutro Abs 12.0 (H) 1.7 - 7.7 K/uL   Lymphocytes Relative 4 %   Lymphs Abs 0.5 (L) 0.7 - 4.0 K/uL   Monocytes Relative 2 %   Monocytes Absolute 0.3 0.1 - 1.0 K/uL   Eosinophils Relative 0 %   Eosinophils Absolute 0.0 0.0 - 0.7 K/uL   Basophils Relative 0 %   Basophils Absolute 0.0 0.0 - 0.1 K/uL  Lipase, blood     Status:  None   Collection Time: 09/17/16  4:49 AM  Result Value Ref Range   Lipase 20 11 - 51 U/L  Comprehensive metabolic panel     Status: Abnormal   Collection Time: 09/17/16  4:49 AM  Result Value Ref Range   Sodium 135 135 - 145 mmol/L   Potassium 2.9 (L) 3.5 - 5.1 mmol/L   Chloride 102 101 - 111 mmol/L   CO2 21 (L) 22 - 32 mmol/L   Glucose, Bld 159 (H) 65 - 99 mg/dL   BUN 15 6 - 20 mg/dL   Creatinine, Ser 4.32 0.61 - 1.24 mg/dL   Calcium 8.8 (L) 8.9 - 10.3 mg/dL   Total Protein 6.9 6.5 - 8.1 g/dL   Albumin 3.7 3.5 - 5.0 g/dL   AST 0,807 (H) 15 - 41 U/L   ALT 1,354 (H) 17 - 63 U/L   Alkaline Phosphatase 99 38 - 126 U/L   Total Bilirubin 2.9 (H) 0.3 - 1.2 mg/dL   GFR calc non Af Amer >60 >60 mL/min   GFR calc Af Amer >60 >60 mL/min    Comment: (NOTE) The eGFR has been calculated using the CKD EPI equation. This calculation has not been validated in all clinical situations. eGFR's persistently <60 mL/min signify possible Chronic Kidney Disease.    Anion gap 12 5 - 15  APTT     Status: Abnormal   Collection Time: 09/17/16  6:12 AM  Result Value Ref Range   aPTT 38 (H) 24 - 36 seconds    Comment:        IF BASELINE aPTT IS ELEVATED, SUGGEST PATIENT RISK ASSESSMENT BE USED TO DETERMINE APPROPRIATE ANTICOAGULANT THERAPY.   Protime-INR     Status: Abnormal   Collection Time: 09/17/16  6:12 AM  Result Value Ref Range   Prothrombin Time 16.6 (H) 11.4 - 15.2 seconds   INR 1.33   Acetaminophen level     Status: Abnormal   Collection Time: 09/17/16  7:10 AM  Result Value Ref Range   Acetaminophen (Tylenol), Serum <10 (L) 10 - 30 ug/mL    Comment:        THERAPEUTIC CONCENTRATIONS VARY SIGNIFICANTLY. A RANGE OF 10-30 ug/mL MAY BE AN EFFECTIVE CONCENTRATION FOR MANY PATIENTS. HOWEVER, SOME ARE BEST TREATED AT CONCENTRATIONS OUTSIDE THIS RANGE. ACETAMINOPHEN CONCENTRATIONS >150 ug/mL AT 4 HOURS AFTER INGESTION AND >50 ug/mL AT 12 HOURS AFTER INGESTION ARE OFTEN ASSOCIATED  WITH TOXIC REACTIONS.   I-Stat CG4 Lactic Acid, ED     Status: Abnormal   Collection Time: 09/17/16  7:26 AM  Result Value Ref Range   Lactic Acid, Venous 2.08 (HH) 0.5 - 1.9 mmol/L   Comment NOTIFIED PHYSICIAN   Glucose, capillary     Status: Abnormal   Collection Time: 09/17/16 12:04 PM  Result Value  Ref Range   Glucose-Capillary 144 (H) 65 - 99 mg/dL  Magnesium     Status: Abnormal   Collection Time: 09/17/16 12:59 PM  Result Value Ref Range   Magnesium 1.6 (L) 1.7 - 2.4 mg/dL  Potassium     Status: Abnormal   Collection Time: 09/17/16 12:59 PM  Result Value Ref Range   Potassium 3.4 (L) 3.5 - 5.1 mmol/L   Ct Abdomen Pelvis W Contrast  Result Date: 09/17/2016 CLINICAL DATA:  Epigastric pain for 2 days.  Leukocytosis. EXAM: CT ABDOMEN AND PELVIS WITH CONTRAST TECHNIQUE: Multidetector CT imaging of the abdomen and pelvis was performed using the standard protocol following bolus administration of intravenous contrast. CONTRAST:  158m ISOVUE-300 IOPAMIDOL (ISOVUE-300) INJECTION 61% COMPARISON:  None. FINDINGS: Lower chest: No acute abnormality. Hepatobiliary: Low-attenuation in the left lobe of the liver may represent transient hepatic attenuation changes. Additional scattered low-attenuation foci are indeterminate but more likely benign. Gallbladder and bile ducts are unremarkable. Pancreas: Unremarkable. No pancreatic ductal dilatation or surrounding inflammatory changes. Spleen: Normal in size without focal abnormality. Adrenals/Urinary Tract: Adrenal glands are unremarkable. Kidneys are normal, without renal calculi, focal lesion, or hydronephrosis. Bladder is unremarkable. Stomach/Bowel: Colonic diverticulosis. Acute inflammation of the hepatic flexure is centered on a diverticulum and probably represents acute diverticulitis. Appendix is normal. Stomach and small bowel are unremarkable. Vascular/Lymphatic: The abdominal aorta is normal in caliber with moderate atherosclerotic  calcification. No adenopathy in the abdomen or pelvis. Reproductive: Unremarkable Other: No ascites. Musculoskeletal: No significant skeletal lesion. IMPRESSION: 1. Right hemicolon diverticulitis in the hepatic flexure. 2. Hepatic hypodensities are indeterminate but more likely benign. MRI should be considered for conclusive characterization. 3. Aortic atherosclerosis. Electronically Signed   By: DAndreas NewportM.D.   On: 09/17/2016 06:11   UKoreaAbdomen Limited Ruq  Result Date: 09/17/2016 CLINICAL DATA:  Transaminitis, hypertension, diabetes. Hepatic CT attenuation abnormality may represent THAD, infarct, focal fat. EXAM: ULTRASOUND ABDOMEN LIMITED RIGHT UPPER QUADRANT COMPARISON:  None. FINDINGS: Gallbladder: Distended, without stones or wall thickening. There is a trace amount of pericholecystic fluid. Sonographer reports no sonographic Murphy's sign. Common bile duct: Diameter: 7 mm, unremarkable Liver: No focal lesion identified. Within normal limits in parenchymal echogenicity. Antegrade color Doppler signal in the main portal vein. IMPRESSION: 1. Negative for cholelithiasis. 2. Small amount of pericholecystic fluid, nonspecific. Electronically Signed   By: DLucrezia EuropeM.D.   On: 09/17/2016 14:00    Assessment: 1. Probable right-sided diverticulitis, may or may not account for rectal bleeding which according to EMS stopped 2. Transaminitis, differential includes alcohol, as well as acute viral hepatitis. No obvious biliary source by CT scan Plan:  1. IV Cipro and Flagyl 2. Acute hepatitis panel 3. Monitor stools and hemoglobin 4. Right upper quadrant ultrasound ordered 5. I would treat with PPI given his significant NSAID use 6. Repeat CBC and liver function tests tomorrow 7. Eventual colonoscopy 8. Will follow with you. Caitlen Worth C 09/17/2016, 3:20 PM  Pager [phone] no answer or after 5 PM call [phone]

## 2016-09-17 NOTE — ED Triage Notes (Signed)
Pt c/o abd pain that started yesterday. Pt reports hiccups started about midnight and has vomited x 4 since then.

## 2016-09-17 NOTE — ED Notes (Signed)
Pt reports pain has decreased at this time.

## 2016-09-17 NOTE — H&P (Addendum)
History and Physical    Caleb Hernandez ZYS:[id number] DOB: [birth year] DOA: 09/17/2016  PCP: Patient, No Pcp Per Patient coming from: Home  Chief Complaint: Caleb Hernandez  HPI: Caleb Hernandez is a 56 y.o. male with medical history significant of hypertension and diabetes comes to the ER with complaints of abdominal pain. Patient states he has been having periumbilical and epigastric abdominal pain for the past 3 days which has somewhat progressed in intensity and at the time of ED arrival he states it was about 7/10 in intensity. He did have episode of nausea last 2 days with one episode of nonbloody vomiting. He has not had anything to eat all day since yesterday due to abdominal pain. Denies any fevers and chills. Yesterday evening he had large bright red bloody bowel movement which she has never experienced before. This morning he had dark black stool. Denies any symptoms of lightheadedness, dizziness, shortness of breath, palpitations and other complaints. Denies any recent weight loss or NSAID abuse. Reports she had colonoscopy about 5 years ago and was told he has polyps which were noncancerous otherwise doesn't know much about it. Reports of medical compliance with his metformin and lisinopril.  Admits of smoking 5-10 cigarettes daily, drinks 4-6 beers daily, no illicit drug use  Patient initially presented with the symptoms to [facility] ER with these complaints and at that time he was noted to have hemodynamically stable vital signs, hemoglobin of 14.9 which appears to be around his baseline. Creatinine was 1.01 and BUN of 15. He is noted to have significantly elevated AST of 2000 and ALT greater than 1000. Total bilirubin noted to be 2.9. He was noted to be hypokalemic therefore potassium replacement was ordered. GI was consult in.   Review of Systems: As per HPI otherwise 10 point review of systems negative.   Past Medical History:  Diagnosis Date  . Diabetes mellitus without  complication ([place])   . High cholesterol   . Hypertension     Past Surgical History:  Procedure Laterality Date  . BACK SURGERY       reports that he has been smoking.  He has never used smokeless tobacco. He reports that he drinks alcohol. He reports that he does not use drugs.  No Known Allergies  No family history on file.  Acceptable: Family history reviewed and not pertinent (If you reviewed it)  Prior to Admission medications   Medication Sig Start Date End Date Taking? Authorizing Provider  lisinopril (PRINIVIL,ZESTRIL) 20 MG tablet Take 20 mg by mouth daily.   Yes [provider]  dicyclomine (BENTYL) 20 MG tablet Take 1 tablet (20 mg total) by mouth 2 (two) times daily. 04/08/16   Delos Haring, PA-C  metFORMIN (GLUCOPHAGE) 500 MG tablet Take 500 mg by mouth 2 (two) times daily with a meal.    [provider]    Physical Exam: Vitals:   09/17/16 0800 09/17/16 0830 09/17/16 0935 09/17/16 0942  BP: (!) 140/99 (!) 155/95  (!) 146/86  Pulse: 93 96  87  Resp: (!) 29 (!) 25  (!) 22  Temp:    98.2 F (36.8 C)  TempSrc:    Oral  SpO2: 93% 96%  100%  Weight:   71.8 kg (158 lb 4.6 oz)   Height:   5\' 5"  (1.651 m)       Constitutional: NAD, calm, comfortable Vitals:   09/17/16 0800 09/17/16 0830 09/17/16 0935 09/17/16 0942  BP: (!) 140/99 (!) 155/95  (!) 146/86  Pulse: 93 96  87  Resp: (!) 29 (!) 25  (!) 22  Temp:    98.2 F (36.8 C)  TempSrc:    Oral  SpO2: 93% 96%  100%  Weight:   71.8 kg (158 lb 4.6 oz)   Height:   5\' 5"  (1.651 m)    Eyes: PERRL, lids and conjunctivae normal ENMT: Mucous membranes are moist. Posterior pharynx clear of any exudate or lesions.Normal dentition.  Neck: normal, supple, no masses, no thyromegaly Respiratory: clear to auscultation bilaterally, no wheezing, no crackles. Normal respiratory effort. No accessory muscle use.  Cardiovascular: Regular rate and rhythm, no murmurs / rubs / gallops. No extremity edema. 2+  pedal pulses. No carotid bruits.  Abdomen: mild tenderness to palpation in Periumbilical and Epigastric Area, no masses palpated. No hepatosplenomegaly. Bowel sounds positive.  Musculoskeletal: no clubbing / cyanosis. No joint deformity upper and lower extremities. Good ROM, no contractures. Normal muscle tone.  Skin: no rashes, lesions, ulcers. No induration Neurologic: CN 2-12 grossly intact. Sensation intact, DTR normal. Strength 5/5 in all 4.  Psychiatric: Normal judgment and insight. Alert and oriented x 3. Normal mood.     Labs on Admission: I have personally reviewed following labs and imaging studies  CBC:  Recent Labs Lab 09/17/16 0449  WBC 12.9*  NEUTROABS 12.0*  HGB 14.9  HCT 42.4  MCV 85.8  PLT 546*   Basic Metabolic Panel:  Recent Labs Lab 09/17/16 0449  NA 135  K 2.9*  CL 102  CO2 21*  GLUCOSE 159*  BUN 15  CREATININE 1.01  CALCIUM 8.8*   GFR: Estimated Creatinine Clearance: 71.9 mL/min (by C-G formula based on SCr of 1.01 mg/dL). Liver Function Tests:  Recent Labs Lab 09/17/16 0449  AST 2,144*  ALT 1,354*  ALKPHOS 99  BILITOT 2.9*  PROT 6.9  ALBUMIN 3.7    Recent Labs Lab 09/17/16 0449  LIPASE 20   No results for input(s): AMMONIA in the last 168 hours. Coagulation Profile:  Recent Labs Lab 09/17/16 0612  INR 1.33   Cardiac Enzymes: No results for input(s): CKTOTAL, CKMB, CKMBINDEX, TROPONINI in the last 168 hours. BNP (last 3 results) No results for input(s): PROBNP in the last 8760 hours. HbA1C: No results for input(s): HGBA1C in the last 72 hours. CBG: No results for input(s): GLUCAP in the last 168 hours. Lipid Profile: No results for input(s): CHOL, HDL, LDLCALC, TRIG, CHOLHDL, LDLDIRECT in the last 72 hours. Thyroid Function Tests: No results for input(s): TSH, T4TOTAL, FREET4, T3FREE, THYROIDAB in the last 72 hours. Anemia Panel: No results for input(s): VITAMINB12, FOLATE, FERRITIN, TIBC, IRON, RETICCTPCT in the last  72 hours. Urine analysis:    Component Value Date/Time   COLORURINE YELLOW 04/08/2016 1035   APPEARANCEUR CLEAR 04/08/2016 1035   LABSPEC >1.046 (H) 04/08/2016 1035   PHURINE 5.5 04/08/2016 1035   GLUCOSEU >=500 (A) 04/08/2016 1035   HGBUR NEGATIVE 04/08/2016 1035   BILIRUBINUR NEGATIVE 04/08/2016 1035   KETONESUR 15 (A) 04/08/2016 1035   PROTEINUR 30 (A) 04/08/2016 1035   NITRITE NEGATIVE 04/08/2016 1035   LEUKOCYTESUR NEGATIVE 04/08/2016 1035   Sepsis Labs: !!!!!!!!!!!!!!!!!!!!!!!!!!!!!!!!!!!!!!!!!!!! @LABRCNTIP (procalcitonin:4,lacticidven:4) )No results found for this or any previous visit (from the past 240 hour(s)).   Radiological Exams on Admission: Ct Abdomen Pelvis W Contrast  Result Date: 09/17/2016 CLINICAL DATA:  Epigastric pain for 2 days.  Leukocytosis. EXAM: CT ABDOMEN AND PELVIS WITH CONTRAST TECHNIQUE: Multidetector CT imaging of the abdomen and pelvis was performed using the  standard protocol following bolus administration of intravenous contrast. CONTRAST:  154mL ISOVUE-300 IOPAMIDOL (ISOVUE-300) INJECTION 61% COMPARISON:  None. FINDINGS: Lower chest: No acute abnormality. Hepatobiliary: Low-attenuation in the left lobe of the liver may represent transient hepatic attenuation changes. Additional scattered low-attenuation foci are indeterminate but more likely benign. Gallbladder and bile ducts are unremarkable. Pancreas: Unremarkable. No pancreatic ductal dilatation or surrounding inflammatory changes. Spleen: Normal in size without focal abnormality. Adrenals/Urinary Tract: Adrenal glands are unremarkable. Kidneys are normal, without renal calculi, focal lesion, or hydronephrosis. Bladder is unremarkable. Stomach/Bowel: Colonic diverticulosis. Acute inflammation of the hepatic flexure is centered on a diverticulum and probably represents acute diverticulitis. Appendix is normal. Stomach and small bowel are unremarkable. Vascular/Lymphatic: The abdominal aorta is normal in  caliber with moderate atherosclerotic calcification. No adenopathy in the abdomen or pelvis. Reproductive: Unremarkable Other: No ascites. Musculoskeletal: No significant skeletal lesion. IMPRESSION: 1. Right hemicolon diverticulitis in the hepatic flexure. 2. Hepatic hypodensities are indeterminate but more likely benign. MRI should be considered for conclusive characterization. 3. Aortic atherosclerosis. Electronically Signed   By: Andreas Newport M.D.   On: 09/17/2016 06:11    EKG: Independently reviewed. Some PVCs  Assessment/Plan Principal Problem:   GI bleed Active Problems:   Abdominal pain   HTN (hypertension)   DM2 (diabetes mellitus, type 2) (HCC)   Diverticulitis   Alcohol abuse   Tobacco use   Abdominal pain with lower GI bleed Right hemicolon diverticulitis -Admit to MedSurg floor as he is hemodynamically stable at this time -Closely monitor vital signs -Recheck CBC and coags -Advance diet as tolerated. IV Cipro and Flagyl -Tylenol as needed -GI consult in in the ED. -He Will need outpatient colonoscopy in about 6 weeks -No transfusion needed at this time. Pain control  Severe Transaminitis with elevated total bilirubin -Alcoholic pattern. Concerns for alcoholic hepatitis. Vs Stone vs hypotension/Shock Liver but the patter suggest alcohol -I will order right upper quadrant ultrasound; acute hepatitis panel. Tylenol level - neg -Hold off on giving prednisone at this time. GI has been consult in.  Hypokalemia -40 meQ potassium ordered in ER. Recheck after and check magnesium along with that as well  Alcohol abuse -Daily drinks 5-6 beers -Place him on CIWA protocol -Counseled to quit using alcohol  Diabetes type 2 -Hold metformin -Accu-Chek and sliding scale at this time  Hypertension -No signs of hemodynamic instability therefore continue lisinopril 20 mg daily  Daily tobacco use -Smokes 5-10 cigarettes daily. Nicotine patch if needed -Counseled to quit  using tobacco   DVT prophylaxis: SCDs due to GI bleed  Code Status: Full Family Communication: Sister at bedside Disposition Plan: To be determined Consults called:  GI consult in in ED Admission status: MedSurg inpatient   Palestine Mosco Arsenio Loader MD [facility]   If 7PM-7AM, please contact night-coverage [email or web address] Password [facility]  09/17/2016, 10:39 AM

## 2016-09-18 ENCOUNTER — Encounter: Payer: Self-pay | Admitting: Cardiology

## 2016-09-18 DIAGNOSIS — I441 Atrioventricular block, second degree: Secondary | ICD-10-CM

## 2016-09-18 DIAGNOSIS — K625 Hemorrhage of anus and rectum: Secondary | ICD-10-CM

## 2016-09-18 DIAGNOSIS — K7010 Alcoholic hepatitis without ascites: Secondary | ICD-10-CM

## 2016-09-18 LAB — COMPREHENSIVE METABOLIC PANEL
ALBUMIN: 3 g/dL — AB (ref 3.5–5.0)
ALK PHOS: 117 U/L (ref 38–126)
ALT: 719 U/L — ABNORMAL HIGH (ref 17–63)
ANION GAP: 6 (ref 5–15)
AST: 302 U/L — ABNORMAL HIGH (ref 15–41)
BUN: 7 mg/dL (ref 6–20)
CHLORIDE: 105 mmol/L (ref 101–111)
CO2: 26 mmol/L (ref 22–32)
Calcium: 8.3 mg/dL — ABNORMAL LOW (ref 8.9–10.3)
Creatinine, Ser: 0.82 mg/dL (ref 0.61–1.24)
GFR calc Af Amer: >60 mL/min (ref >60)
GFR calc non Af Amer: >60 mL/min (ref >60)
GLUCOSE: 121 mg/dL — AB (ref 65–99)
POTASSIUM: 3 mmol/L — AB (ref 3.5–5.1)
SODIUM: 137 mmol/L (ref 135–145)
Total Bilirubin: 3 mg/dL — ABNORMAL HIGH (ref 0.3–1.2)
Total Protein: 6.2 g/dL — ABNORMAL LOW (ref 6.5–8.1)

## 2016-09-18 LAB — HEPATITIS PANEL, ACUTE
HCV AB: 0.1 {s_co_ratio} (ref 0.0–0.9)
HEP B C IGM: NEGATIVE
Hep A IgM: NEGATIVE
Hepatitis B Surface Ag: NEGATIVE

## 2016-09-18 LAB — CBC
HEMATOCRIT: 41.1 % (ref 39.0–52.0)
HEMOGLOBIN: 14.3 g/dL (ref 13.0–17.0)
MCH: 29.3 pg (ref 26.0–34.0)
MCHC: 34.8 g/dL (ref 30.0–36.0)
MCV: 84.2 fL (ref 78.0–100.0)
Platelets: 128 10*3/uL — ABNORMAL LOW (ref 150–400)
RBC: 4.88 MIL/uL (ref 4.22–5.81)
RDW: 13.3 % (ref 11.5–15.5)
WBC: 7.5 10*3/uL (ref 4.0–10.5)

## 2016-09-18 LAB — PROTIME-INR
INR: 1.19
Prothrombin Time: 15.2 seconds (ref 11.4–15.2)

## 2016-09-18 LAB — GLUCOSE, CAPILLARY
GLUCOSE-CAPILLARY: 117 mg/dL — AB (ref 65–99)
GLUCOSE-CAPILLARY: 88 mg/dL (ref 65–99)
GLUCOSE-CAPILLARY: 173 mg/dL — AB (ref 65–99)
Glucose-Capillary: 130 mg/dL — ABNORMAL HIGH (ref 65–99)

## 2016-09-18 LAB — MAGNESIUM: MAGNESIUM: 1.7 mg/dL (ref 1.7–2.4)

## 2016-09-18 LAB — APTT: aPTT: 34 seconds (ref 24–36)

## 2016-09-18 LAB — HIV ANTIBODY (ROUTINE TESTING W REFLEX): HIV SCREEN 4TH GENERATION: NONREACTIVE

## 2016-09-18 MED ORDER — ACETAMINOPHEN 325 MG PO TABS
325.0000 mg | ORAL_TABLET | Freq: Four times a day (QID) | ORAL | Status: DC | PRN
Start: 2016-09-18 — End: 2016-09-20
  Administered 2016-09-18 (×2): 325 mg via ORAL
  Filled 2016-09-18 (×2): qty 1

## 2016-09-18 MED ORDER — POTASSIUM CHLORIDE CRYS ER 20 MEQ PO TBCR
40.0000 meq | EXTENDED_RELEASE_TABLET | ORAL | Status: AC
  Administered 2016-09-18 (×2): 40 meq via ORAL
  Filled 2016-09-18 (×2): qty 2

## 2016-09-18 MED ORDER — ACETAMINOPHEN 650 MG RE SUPP
325.0000 mg | Freq: Four times a day (QID) | RECTAL | Status: DC | PRN
Start: 2016-09-18 — End: 2016-09-20

## 2016-09-18 MED ORDER — HYDRALAZINE HCL 20 MG/ML IJ SOLN
10.0000 mg | Freq: Four times a day (QID) | INTRAMUSCULAR | Status: DC | PRN
  Filled 2016-09-18: qty 0.5

## 2016-09-18 MED ORDER — MAGNESIUM SULFATE 2 GM/50ML IV SOLN
2.0000 g | Freq: Once | INTRAVENOUS | Status: AC
  Administered 2016-09-18: 2 g via INTRAVENOUS
  Filled 2016-09-18: qty 50

## 2016-09-18 MED ORDER — HYDROMORPHONE HCL 1 MG/ML IJ SOLN
0.5000 mg | INTRAMUSCULAR | Status: DC | PRN
  Administered 2016-09-20: 0.5 mg via INTRAVENOUS
  Filled 2016-09-18: qty 0.5

## 2016-09-18 NOTE — Consult Note (Signed)
Cardiology Consultation:   Patient ID: Caleb Hernandez; [id number]; January 09, 1961   Admit date: 09/17/2016 Date of Consult: 09/18/2016  Primary Care Provider: Patient, No Pcp Per Primary Cardiologist: None Primary Electrophysiologist:  None   Patient Profile:   Caleb Hernandez is a 56 y.o. male with a hx of diabetes, hypercholesterolemia, daily alcohol use, hypertension, daily smoker.  He is being seen today for the evaluation of first degree AV heart block at the request of Dr. Algis Liming.  History of Present Illness:   Caleb Hernandez was admitted to the hospital yesterday to receive treatment for abdominal pain with lower GI bleeding most likely from diverticulitis, severe transaminates with elevated bilirubin, and hypokalemia.   He has no cardiac history. He was seen by Gastroenterology yesterday who started IV Cipro/Flagyl and a PPI.  This morning his EKG showed HR 72, incomplete RBBB, and first degree AV block. Telemetry review shows second degree Mobitz I (wenckebach ) . Previous EKGs this admission showed an accelerated junctional rhythm.  He has not had chest pains, SOB, le swelling. Denies being told of EKG abnormalities before in the past.    Past Medical History:  Diagnosis Date  . Diabetes mellitus without complication ([place])   . High cholesterol   . Hypertension     Past Surgical History:  Procedure Laterality Date  . BACK SURGERY       Inpatient Medications: Scheduled Meds: . folic acid  1 mg Oral Daily  . insulin aspart  0-15 Units Subcutaneous TID WC  . insulin aspart  0-5 Units Subcutaneous QHS  . lisinopril  20 mg Oral Daily  . multivitamin with minerals  1 tablet Oral Daily  . pantoprazole  40 mg Oral BID  . potassium chloride  40 mEq Oral Q4H  . thiamine  100 mg Oral Daily   Or  . thiamine  100 mg Intravenous Daily   Continuous Infusions: . sodium chloride 100 mL/hr at 09/18/16 1103  . ciprofloxacin Stopped (09/18/16 1203)  . metronidazole  Stopped (09/18/16 0519)   PRN Meds: acetaminophen **OR** acetaminophen, chlorproMAZINE, LORazepam **OR** LORazepam  Allergies:   No Known Allergies  Social History:   Social History   Social History  . Marital status: Single    Spouse name: N/A  . Number of children: N/A  . Years of education: N/A   Occupational History  . Not on file.   Social History Main Topics  . Smoking status: Current Every Day Smoker  . Smokeless tobacco: Never Used  . Alcohol use Yes     Comment: 40 oz per day  . Drug use: No  . Sexual activity: Not on file   Other Topics Concern  . Not on file   Social History Narrative  . No narrative on file    Family History:   The patient's family history is not on file.  ROS:  Please see the history of present illness.  All other ROS reviewed and negative.     Physical Exam/Data:   Vitals:   09/17/16 2106 09/17/16 2247 09/18/16 0421 09/18/16 1126  BP: 122/71  (!) 147/71 (!) 176/89  Pulse: (!) 102  71 72  Resp: 20  20 20   Temp: (!) 102.9 F (39.4 C) 100.3 F (37.9 C) (!) 100.4 F (38 C) 98 F (36.7 C)  TempSrc: Oral Oral Oral Oral  SpO2: 99%  99% 99%  Weight:      Height:        Intake/Output Summary (Last 24 hours) at  09/18/16 1214 Last data filed at 09/18/16 1103  Gross per 24 hour  Intake             3840 ml  Output             1000 ml  Net             2840 ml   Filed Weights   09/17/16 0434 09/17/16 0935  Weight: 165 lb (74.8 kg) 158 lb 4.6 oz (71.8 kg)   Body mass index is 26.34 kg/m.  General: Well developed, well nourished, in no acute distress. Head: Normocephalic, atraumatic, sclera non-icteric, no xanthomas, nares are without discharge.  Neck: Negative for carotid bruits. JVD not elevated. Lungs: Clear bilaterally to auscultation without wheezes, rales, or rhonchi. Breathing is unlabored. Heart: RRR with S1 S2. No murmurs, rubs, or gallops appreciated. Abdomen: Soft, non-tender, non-distended with normoactive bowel  sounds. No hepatomegaly. No rebound/guarding. No obvious abdominal masses. Msk:  Strength and tone appear normal for age. Extremities: No clubbing or cyanosis. No edema.  Distal pedal pulses are 2+ and equal bilaterally. Neuro: Alert and oriented X 3. No facial asymmetry. No focal deficit. Moves all extremities spontaneously. Psych:  Responds to questions appropriately with a normal affect.  EKG:  The EKG was personally reviewed and demonstrates incomplete RBBB and first degree AV block  Relevant CV Studies: None  Laboratory Data:  Chemistry Recent Labs Lab 09/17/16 0449 09/17/16 1259 09/18/16 0458  NA 135  --  137  K 2.9* 3.4* 3.0*  CL 102  --  105  CO2 21*  --  26  GLUCOSE 159*  --  121*  BUN 15  --  7  CREATININE 1.01  --  0.82  CALCIUM 8.8*  --  8.3*  GFRNONAA >60  --  >60  GFRAA >60  --  >60  ANIONGAP 12  --  6     Recent Labs Lab 09/17/16 0449 09/18/16 0458  PROT 6.9 6.2*  ALBUMIN 3.7 3.0*  AST 2,144* 302*  ALT 1,354* 719*  ALKPHOS 99 117  BILITOT 2.9* 3.0*   Hematology Recent Labs Lab 09/17/16 0449 09/18/16 0458  WBC 12.9* 7.5  RBC 4.94 4.88  HGB 14.9 14.3  HCT 42.4 41.1  MCV 85.8 84.2  MCH 30.2 29.3  MCHC 35.1 34.8  RDW 13.3 13.3  PLT 132* 128*   Cardiac EnzymesNo results for input(s): TROPONINI in the last 168 hours. No results for input(s): TROPIPOC in the last 168 hours.  BNPNo results for input(s): BNP, PROBNP in the last 168 hours.  DDimer No results for input(s): DDIMER in the last 168 hours.  Radiology/Studies:  Ct Abdomen Pelvis W Contrast  Result Date: 09/17/2016 CLINICAL DATA:  Epigastric pain for 2 days.  Leukocytosis. EXAM: CT ABDOMEN AND PELVIS WITH CONTRAST TECHNIQUE: Multidetector CT imaging of the abdomen and pelvis was performed using the standard protocol following bolus administration of intravenous contrast. CONTRAST:  115mL ISOVUE-300 IOPAMIDOL (ISOVUE-300) INJECTION 61% COMPARISON:  None. FINDINGS: Lower chest: No acute  abnormality. Hepatobiliary: Low-attenuation in the left lobe of the liver may represent transient hepatic attenuation changes. Additional scattered low-attenuation foci are indeterminate but more likely benign. Gallbladder and bile ducts are unremarkable. Pancreas: Unremarkable. No pancreatic ductal dilatation or surrounding inflammatory changes. Spleen: Normal in size without focal abnormality. Adrenals/Urinary Tract: Adrenal glands are unremarkable. Kidneys are normal, without renal calculi, focal lesion, or hydronephrosis. Bladder is unremarkable. Stomach/Bowel: Colonic diverticulosis. Acute inflammation of the hepatic flexure is centered on a  diverticulum and probably represents acute diverticulitis. Appendix is normal. Stomach and small bowel are unremarkable. Vascular/Lymphatic: The abdominal aorta is normal in caliber with moderate atherosclerotic calcification. No adenopathy in the abdomen or pelvis. Reproductive: Unremarkable Other: No ascites. Musculoskeletal: No significant skeletal lesion. IMPRESSION: 1. Right hemicolon diverticulitis in the hepatic flexure. 2. Hepatic hypodensities are indeterminate but more likely benign. MRI should be considered for conclusive characterization. 3. Aortic atherosclerosis. Electronically Signed   By: Andreas Newport M.D.   On: 09/17/2016 06:11   US Abdomen Limited Ruq  Result Date: 09/17/2016 CLINICAL DATA:  Transaminitis, hypertension, diabetes. Hepatic CT attenuation abnormality may represent THAD, infarct, focal fat. EXAM: ULTRASOUND ABDOMEN LIMITED RIGHT UPPER QUADRANT COMPARISON:  None. FINDINGS: Gallbladder: Distended, without stones or wall thickening. There is a trace amount of pericholecystic fluid. Sonographer reports no sonographic Murphy's sign. Common bile duct: Diameter: 7 mm, unremarkable Liver: No focal lesion identified. Within normal limits in parenchymal echogenicity. Antegrade color Doppler signal in the main portal vein. IMPRESSION: 1.  Negative for cholelithiasis. 2. Small amount of pericholecystic fluid, nonspecific. Electronically Signed   By: Lucrezia Europe M.D.   On: 09/17/2016 14:00    Assessment and Plan:   1. AV block Mobitz I Desiree Hane) : Patients rhythm is currently benign and does not require any intervention at this time. Its presentation is likely due to his clinica scenario/illness and electrolyte abnormalities. Please be careful to refrain from medications that exacerbate/cause Mobitz I AV block.  -- Consider monitor as an outpatient for further evaluation.  Kristopher Glee, PA-C  09/18/2016 12:14 PM   Attending Note:   The patient was seen and examined.  Agree with assessment and plan as noted above.  Changes made to the above note as needed.  Patient seen and independently examined with Delos Haring, PA.   We discussed all aspects of the encounter. I agree with the assessment and plan as stated above.  1.  Variable AV block Has had episodes of Wenchebach AV block .   Is in NSR with 1st degree AV block currently At one point, he has 2:1 AV block that could be Wencheback  As well.   We never see him miss more than 1 P wave. He is asymptomatic.  I suspect this is related to a metabolic or electrolyte issue. Seems to be improving   He will not need cardiology follow up unless he has syncope or similar symptoms   I have spent a total of 40 minutes with patient reviewing hospital  notes , telemetry, EKGs, labs and examining patient as well as establishing an assessment and plan that was discussed with the patient. > 50% of time was spent in direct patient care.  Will sign off.  Call for questions   Ramond Dial., MD, [facility] 09/18/2016, 1:48 PM [address]  [place] Pager [phone]

## 2016-09-18 NOTE — Progress Notes (Signed)
PROGRESS NOTE   Caleb Hernandez  ZJQ:[id number]    DOB: September 28, 1960    DOA: 09/17/2016  PCP: Patient, No Pcp Per   I have briefly reviewed patients previous medical records in [facility].  Brief Narrative:  56 year old male with PMH of HTN, DM, HLD, tobacco abuse, alcohol abuse presented to the ED with 3 days history of periumbilical/epigastric abdominal pain, nausea, 1 episode of nonbloody emesis, decreased oral intake, an episode of large bright red bleeding per rectum and dark black stools. Uses Advil for lower extremity pain. Reported colonoscopy 5 years ago and told to have polyps which were noncancerous. Noted to have R hemicolon diverticulitis, GI bleed and markedly abnormal LFTs. [facility] consulted. Noted abnormal rhythm on telemetry, cardiology consulted on 6/28.   Assessment & Plan:   Principal Problem:   GI bleed Active Problems:   Abdominal pain   HTN (hypertension)   DM2 (diabetes mellitus, type 2) (HCC)   Diverticulitis   Alcohol abuse   Tobacco use   1. Acute right hemicolon diverticulitis: DD: Infectious versus inflammatory. Treating supportively with bowel rest/liquid diet, IV Cipro and Flagyl. Improving. Continue same management. Recommend repeating colonoscopy in 6 weeks. GI follow-up appreciated. 2. Acute hepatitis:? Alcoholic. CT abdomen showed hepatic hypodensities. Ultrasound RUQ was negative for cholelithiasis. Acetaminophen level normal. Acute hepatitis panel negative. LFTs improving. GI follow-up appreciated. 3. Rectal bleeding: Probably was minimal given stability of hemoglobin. Resolved. Continue to monitor. 4. Hypokalemia: Replace aggressively and follow. 5. Hypomagnesemia: We'll replace and follow. 6. Thrombocytopenia:? Related to alcohol abuse. Stable. Follow CBCs. 7. Alcohol abuse: Drinks 5-6 small cans of beer daily and more on weekends. Denies hard liquor. CIWA protocol.  8. DM 2: Holding metformin. SSI. Good inpatient control. 9. Essential  hypertension: Mildly uncontrolled. Continue lisinopril. 10. Tobacco abuse: Cessation counseled. Declined nicotine patch. 11. Variable AV block: Asymptomatic. Noted on telemetry. Cardiology input appreciated and suspect this is related to metabolic/electrolyte issues-replace as needed. No further recommendations made by cardiology. Discussed with cardiology.   DVT prophylaxis: SCD's Code Status: Full Family Communication: None at bedside Disposition: DC home when medically improved.   Consultants:  [facility]   Procedures:  None  Antimicrobials:  IV Cipro and Flagyl    Subjective: Feels better. Reported epigastric pain, moderate, nonradiating. No further nausea or vomiting. Had normal colored BMs without black colored stools or blood in stools.   ROS: No chest pain, dyspnea or palpitations reported.  Objective:  Vitals:   09/17/16 2247 09/18/16 0421 09/18/16 1126 09/18/16 1252  BP:  (!) 147/71 (!) 176/89 (!) 169/97  Pulse:  71 72 76  Resp:  20 20 20   Temp: 100.3 F (37.9 C) (!) 100.4 F (38 C) 98 F (36.7 C) 98.2 F (36.8 C)  TempSrc: Oral Oral Oral Oral  SpO2:  99% 99% 100%  Weight:      Height:        Examination:  General exam: Pleasant middle-aged male lying comfortably supine in bed. Respiratory system: Clear to auscultation. Respiratory effort normal. Cardiovascular system: S1 & S2 heard, RRR. No JVD, murmurs, rubs, gallops or clicks. No pedal edema. Sinus rhythm with first-degree AV block. Noted some abnormal rhythms including Mobitz type I second-degree AV block,?? Higher block and hence requested cardiology input. Gastrointestinal system: Abdomen is nondistended, soft. Epigastric tenderness without peritoneal signs. No organomegaly or masses felt. Normal bowel sounds heard. Central nervous system: Alert and oriented. No focal neurological deficits. Extremities: Symmetric 5 x 5 power. Skin: No  rashes, lesions or ulcers Psychiatry: Judgement and  insight appear normal. Mood & affect appropriate.     Data Reviewed: I have personally reviewed following labs and imaging studies  CBC:  Recent Labs Lab 09/17/16 0449 09/18/16 0458  WBC 12.9* 7.5  NEUTROABS 12.0*  --   HGB 14.9 14.3  HCT 42.4 41.1  MCV 85.8 84.2  PLT 132* 161*   Basic Metabolic Panel:  Recent Labs Lab 09/17/16 0449 09/17/16 1259 09/18/16 0458  NA 135  --  137  K 2.9* 3.4* 3.0*  CL 102  --  105  CO2 21*  --  26  GLUCOSE 159*  --  121*  BUN 15  --  7  CREATININE 1.01  --  0.82  CALCIUM 8.8*  --  8.3*  MG  --  1.6* 1.7   Liver Function Tests:  Recent Labs Lab 09/17/16 0449 09/18/16 0458  AST 2,144* 302*  ALT 1,354* 719*  ALKPHOS 99 117  BILITOT 2.9* 3.0*  PROT 6.9 6.2*  ALBUMIN 3.7 3.0*   Coagulation Profile:  Recent Labs Lab 09/17/16 0612 09/18/16 0458  INR 1.33 1.19   CBG:  Recent Labs Lab 09/17/16 1204 09/17/16 1727 09/17/16 2116 09/18/16 0748 09/18/16 1124  GLUCAP 144* 191* 120* 117* 130*    No results found for this or any previous visit (from the past 240 hour(s)).       Radiology Studies: Ct Abdomen Pelvis W Contrast  Result Date: 09/17/2016 CLINICAL DATA:  Epigastric pain for 2 days.  Leukocytosis. EXAM: CT ABDOMEN AND PELVIS WITH CONTRAST TECHNIQUE: Multidetector CT imaging of the abdomen and pelvis was performed using the standard protocol following bolus administration of intravenous contrast. CONTRAST:  16mL ISOVUE-300 IOPAMIDOL (ISOVUE-300) INJECTION 61% COMPARISON:  None. FINDINGS: Lower chest: No acute abnormality. Hepatobiliary: Low-attenuation in the left lobe of the liver may represent transient hepatic attenuation changes. Additional scattered low-attenuation foci are indeterminate but more likely benign. Gallbladder and bile ducts are unremarkable. Pancreas: Unremarkable. No pancreatic ductal dilatation or surrounding inflammatory changes. Spleen: Normal in size without focal abnormality.  Adrenals/Urinary Tract: Adrenal glands are unremarkable. Kidneys are normal, without renal calculi, focal lesion, or hydronephrosis. Bladder is unremarkable. Stomach/Bowel: Colonic diverticulosis. Acute inflammation of the hepatic flexure is centered on a diverticulum and probably represents acute diverticulitis. Appendix is normal. Stomach and small bowel are unremarkable. Vascular/Lymphatic: The abdominal aorta is normal in caliber with moderate atherosclerotic calcification. No adenopathy in the abdomen or pelvis. Reproductive: Unremarkable Other: No ascites. Musculoskeletal: No significant skeletal lesion. IMPRESSION: 1. Right hemicolon diverticulitis in the hepatic flexure. 2. Hepatic hypodensities are indeterminate but more likely benign. MRI should be considered for conclusive characterization. 3. Aortic atherosclerosis. Electronically Signed   By: Andreas Newport M.D.   On: 09/17/2016 06:11   US Abdomen Limited Ruq  Result Date: 09/17/2016 CLINICAL DATA:  Transaminitis, hypertension, diabetes. Hepatic CT attenuation abnormality may represent THAD, infarct, focal fat. EXAM: ULTRASOUND ABDOMEN LIMITED RIGHT UPPER QUADRANT COMPARISON:  None. FINDINGS: Gallbladder: Distended, without stones or wall thickening. There is a trace amount of pericholecystic fluid. Sonographer reports no sonographic Murphy's sign. Common bile duct: Diameter: 7 mm, unremarkable Liver: No focal lesion identified. Within normal limits in parenchymal echogenicity. Antegrade color Doppler signal in the main portal vein. IMPRESSION: 1. Negative for cholelithiasis. 2. Small amount of pericholecystic fluid, nonspecific. Electronically Signed   By: Lucrezia Europe M.D.   On: 09/17/2016 14:00        Scheduled Meds: . folic acid  1  mg Oral Daily  . insulin aspart  0-15 Units Subcutaneous TID WC  . insulin aspart  0-5 Units Subcutaneous QHS  . lisinopril  20 mg Oral Daily  . multivitamin with minerals  1 tablet Oral Daily  .  pantoprazole  40 mg Oral BID  . potassium chloride  40 mEq Oral Q4H  . thiamine  100 mg Oral Daily   Or  . thiamine  100 mg Intravenous Daily   Continuous Infusions: . sodium chloride 100 mL/hr at 09/18/16 1103  . ciprofloxacin Stopped (09/18/16 1203)  . metronidazole 500 mg (09/18/16 1231)     LOS: 1 day     Tyshika Baldridge, MD, FACP, FHM. [facility] Pager [phone]  If 7PM-7AM, please contact night-coverage [email or web address] Password [email or web address] 09/18/2016, 2:06 PM

## 2016-09-18 NOTE — Progress Notes (Signed)
BP 183/97, checked twice. MD paged. No new orders.

## 2016-09-18 NOTE — Progress Notes (Signed)
[facility] Progress Note  Subjective: Feels better, no pain no nausea and states last stool was nonbloody.  Objective: Vital signs in last 24 hours: Temp:  [98 F (36.7 C)-102.9 F (39.4 C)] 98 F (36.7 C) (06/28 1126) Pulse Rate:  [71-102] 72 (06/28 1126) Resp:  [20] 20 (06/28 1126) BP: (122-176)/(71-89) 176/89 (06/28 1126) SpO2:  [96 %-99 %] 99 % (06/28 1126) Weight change: -3.044 kg (-6 lb 11.4 oz)   PE: Abdomen nontender  Lab Results: Results for orders placed or performed during the hospital encounter of 09/17/16 (from the past 24 hour(s))  Magnesium     Status: Abnormal   Collection Time: 09/17/16 12:59 PM  Result Value Ref Range   Magnesium 1.6 (L) 1.7 - 2.4 mg/dL  Potassium     Status: Abnormal   Collection Time: 09/17/16 12:59 PM  Result Value Ref Range   Potassium 3.4 (L) 3.5 - 5.1 mmol/L  Glucose, capillary     Status: Abnormal   Collection Time: 09/17/16  5:27 PM  Result Value Ref Range   Glucose-Capillary 191 (H) 65 - 99 mg/dL  Glucose, capillary     Status: Abnormal   Collection Time: 09/17/16  9:16 PM  Result Value Ref Range   Glucose-Capillary 120 (H) 65 - 99 mg/dL  Comprehensive metabolic panel     Status: Abnormal   Collection Time: 09/18/16  4:58 AM  Result Value Ref Range   Sodium 137 135 - 145 mmol/L   Potassium 3.0 (L) 3.5 - 5.1 mmol/L   Chloride 105 101 - 111 mmol/L   CO2 26 22 - 32 mmol/L   Glucose, Bld 121 (H) 65 - 99 mg/dL   BUN 7 6 - 20 mg/dL   Creatinine, Ser 0.82 0.61 - 1.24 mg/dL   Calcium 8.3 (L) 8.9 - 10.3 mg/dL   Total Protein 6.2 (L) 6.5 - 8.1 g/dL   Albumin 3.0 (L) 3.5 - 5.0 g/dL   AST 302 (H) 15 - 41 U/L   ALT 719 (H) 17 - 63 U/L   Alkaline Phosphatase 117 38 - 126 U/L   Total Bilirubin 3.0 (H) 0.3 - 1.2 mg/dL   GFR calc non Af Amer >60 >60 mL/min   GFR calc Af Amer >60 >60 mL/min   Anion gap 6 5 - 15  CBC     Status: Abnormal   Collection Time: 09/18/16  4:58 AM  Result Value Ref Range   WBC 7.5 4.0 - 10.5  K/uL   RBC 4.88 4.22 - 5.81 MIL/uL   Hemoglobin 14.3 13.0 - 17.0 g/dL   HCT 41.1 39.0 - 52.0 %   MCV 84.2 78.0 - 100.0 fL   MCH 29.3 26.0 - 34.0 pg   MCHC 34.8 30.0 - 36.0 g/dL   RDW 13.3 11.5 - 15.5 %   Platelets 128 (L) 150 - 400 K/uL  Protime-INR     Status: None   Collection Time: 09/18/16  4:58 AM  Result Value Ref Range   Prothrombin Time 15.2 11.4 - 15.2 seconds   INR 1.19   APTT     Status: None   Collection Time: 09/18/16  4:58 AM  Result Value Ref Range   aPTT 34 24 - 36 seconds  Magnesium     Status: None   Collection Time: 09/18/16  4:58 AM  Result Value Ref Range   Magnesium 1.7 1.7 - 2.4 mg/dL  Glucose, capillary     Status: Abnormal   Collection Time: 09/18/16  7:48  AM  Result Value Ref Range   Glucose-Capillary 117 (H) 65 - 99 mg/dL  Glucose, capillary     Status: Abnormal   Collection Time: 09/18/16 11:24 AM  Result Value Ref Range   Glucose-Capillary 130 (H) 65 - 99 mg/dL    Studies/Results: Ct Abdomen Pelvis W Contrast  Result Date: 09/17/2016 CLINICAL DATA:  Epigastric pain for 2 days.  Leukocytosis. EXAM: CT ABDOMEN AND PELVIS WITH CONTRAST TECHNIQUE: Multidetector CT imaging of the abdomen and pelvis was performed using the standard protocol following bolus administration of intravenous contrast. CONTRAST:  133mL ISOVUE-300 IOPAMIDOL (ISOVUE-300) INJECTION 61% COMPARISON:  None. FINDINGS: Lower chest: No acute abnormality. Hepatobiliary: Low-attenuation in the left lobe of the liver may represent transient hepatic attenuation changes. Additional scattered low-attenuation foci are indeterminate but more likely benign. Gallbladder and bile ducts are unremarkable. Pancreas: Unremarkable. No pancreatic ductal dilatation or surrounding inflammatory changes. Spleen: Normal in size without focal abnormality. Adrenals/Urinary Tract: Adrenal glands are unremarkable. Kidneys are normal, without renal calculi, focal lesion, or hydronephrosis. Bladder is unremarkable.  Stomach/Bowel: Colonic diverticulosis. Acute inflammation of the hepatic flexure is centered on a diverticulum and probably represents acute diverticulitis. Appendix is normal. Stomach and small bowel are unremarkable. Vascular/Lymphatic: The abdominal aorta is normal in caliber with moderate atherosclerotic calcification. No adenopathy in the abdomen or pelvis. Reproductive: Unremarkable Other: No ascites. Musculoskeletal: No significant skeletal lesion. IMPRESSION: 1. Right hemicolon diverticulitis in the hepatic flexure. 2. Hepatic hypodensities are indeterminate but more likely benign. MRI should be considered for conclusive characterization. 3. Aortic atherosclerosis. Electronically Signed   By: Andreas Newport M.D.   On: 09/17/2016 06:11   US Abdomen Limited Ruq  Result Date: 09/17/2016 CLINICAL DATA:  Transaminitis, hypertension, diabetes. Hepatic CT attenuation abnormality may represent THAD, infarct, focal fat. EXAM: ULTRASOUND ABDOMEN LIMITED RIGHT UPPER QUADRANT COMPARISON:  None. FINDINGS: Gallbladder: Distended, without stones or wall thickening. There is a trace amount of pericholecystic fluid. Sonographer reports no sonographic Murphy's sign. Common bile duct: Diameter: 7 mm, unremarkable Liver: No focal lesion identified. Within normal limits in parenchymal echogenicity. Antegrade color Doppler signal in the main portal vein. IMPRESSION: 1. Negative for cholelithiasis. 2. Small amount of pericholecystic fluid, nonspecific. Electronically Signed   By: Lucrezia Europe M.D.   On: 09/17/2016 14:00      Assessment: Diverticulitis Acute hepatitis, possibly alcoholic no obvious biliary etiology and hepatitis panel negative Rectal bleeding, resolved   Plan: Continue antibiotics Repeat liver function tests in the morning Advance diet    Lexianna Weinrich C 09/18/2016, 12:45 PM  Pager [phone] If no answer or after 5 PM call [phone]

## 2016-09-19 LAB — COMPREHENSIVE METABOLIC PANEL
ALK PHOS: 133 U/L — AB (ref 38–126)
ALT: 450 U/L — ABNORMAL HIGH (ref 17–63)
ANION GAP: 8 (ref 5–15)
AST: 92 U/L — ABNORMAL HIGH (ref 15–41)
Albumin: 3.1 g/dL — ABNORMAL LOW (ref 3.5–5.0)
BUN: 9 mg/dL (ref 6–20)
CALCIUM: 8.5 mg/dL — AB (ref 8.9–10.3)
CHLORIDE: 106 mmol/L (ref 101–111)
CO2: 23 mmol/L (ref 22–32)
Creatinine, Ser: 0.89 mg/dL (ref 0.61–1.24)
GFR calc non Af Amer: >60 mL/min (ref >60)
Glucose, Bld: 108 mg/dL — ABNORMAL HIGH (ref 65–99)
POTASSIUM: 3.1 mmol/L — AB (ref 3.5–5.1)
SODIUM: 137 mmol/L (ref 135–145)
Total Bilirubin: 1.8 mg/dL — ABNORMAL HIGH (ref 0.3–1.2)
Total Protein: 6.4 g/dL — ABNORMAL LOW (ref 6.5–8.1)

## 2016-09-19 LAB — GLUCOSE, CAPILLARY
GLUCOSE-CAPILLARY: 93 mg/dL (ref 65–99)
Glucose-Capillary: 124 mg/dL — ABNORMAL HIGH (ref 65–99)
Glucose-Capillary: 140 mg/dL — ABNORMAL HIGH (ref 65–99)
Glucose-Capillary: 108 mg/dL — ABNORMAL HIGH (ref 65–99)

## 2016-09-19 LAB — CBC
HCT: 40.9 % (ref 39.0–52.0)
Hemoglobin: 14.3 g/dL (ref 13.0–17.0)
MCH: 29.2 pg (ref 26.0–34.0)
MCHC: 35 g/dL (ref 30.0–36.0)
MCV: 83.5 fL (ref 78.0–100.0)
PLATELETS: 140 10*3/uL — AB (ref 150–400)
RBC: 4.9 MIL/uL (ref 4.22–5.81)
RDW: 13.4 % (ref 11.5–15.5)
WBC: 8.5 10*3/uL (ref 4.0–10.5)

## 2016-09-19 LAB — MAGNESIUM: Magnesium: 1.8 mg/dL (ref 1.7–2.4)

## 2016-09-19 MED ORDER — POTASSIUM CHLORIDE CRYS ER 20 MEQ PO TBCR
40.0000 meq | EXTENDED_RELEASE_TABLET | Freq: Once | ORAL | Status: AC
  Administered 2016-09-19: 40 meq via ORAL
  Filled 2016-09-19: qty 2

## 2016-09-19 MED ORDER — POTASSIUM CHLORIDE CRYS ER 20 MEQ PO TBCR
40.0000 meq | EXTENDED_RELEASE_TABLET | ORAL | Status: AC
  Administered 2016-09-19 (×2): 40 meq via ORAL
  Filled 2016-09-19 (×2): qty 2

## 2016-09-19 NOTE — Progress Notes (Signed)
Pt's follow up appointment at [place] July 3 at 34 AM. Pt states he can afford medications on $4 list.

## 2016-09-19 NOTE — Progress Notes (Signed)
PROGRESS NOTE   Caleb Hernandez  HGD:[id number]    DOB: [birth year]    DOA: 09/17/2016  PCP: Patient, No Pcp Per   I have briefly reviewed patients previous medical records in [facility].  Brief Narrative:  56 year old male with PMH of HTN, DM, HLD, tobacco abuse, alcohol abuse presented to the ED with 3 days history of periumbilical/epigastric abdominal pain, nausea, 1 episode of nonbloody emesis, decreased oral intake, an episode of large bright red bleeding per rectum and dark black stools. Uses Advil for lower extremity pain. Reported colonoscopy 5 years ago and told to have polyps which were noncancerous. Noted to have R hemicolon diverticulitis, GI bleed and markedly abnormal LFTs. [facility] consulted. Noted abnormal rhythm on telemetry, cardiology consulted on 6/28.Improving. Possible DC home 6/29.   Assessment & Plan:   Principal Problem:   GI bleed Active Problems:   Abdominal pain   HTN (hypertension)   DM2 (diabetes mellitus, type 2) (HCC)   Diverticulitis   Alcohol abuse   Tobacco use   1. Acute right hemicolon diverticulitis: DD: Infectious versus inflammatory. Treating supportively with bowel rest/liquid diet, IV Cipro and Flagyl. Recommend repeating colonoscopy in 6 weeks. GI follow-up appreciated. Improving. Continue IV antibiotics for additional day and then transitioned to oral antibiotics at discharge with outpatient follow-up with GI regarding colonoscopy. 2. Acute hepatitis:? Alcoholic. CT abdomen showed hepatic hypodensities. Ultrasound RUQ was negative for cholelithiasis. Acetaminophen level normal. Acute hepatitis panel negative. LFTs improving. GI follow-up appreciated. Continues to improve. Outpatient follow-up of LFTs with GI upon discharge. 3. Rectal bleeding: Probably was minimal given stability of hemoglobin. Resolved. Continue to monitor. 4. Hypokalemia: Replace aggressively and follow. 5. Hypomagnesemia: Replaced. 6. Thrombocytopenia:? Related to  alcohol abuse. Improving. 7. Alcohol abuse: Drinks 5-6 small cans of beer daily and more on weekends. Denies hard liquor. CIWA protocol. No overt withdrawal. 8. DM 2: Holding metformin. SSI. Good inpatient control. 9. Essential hypertension: Mildly uncontrolled. Continue lisinopril. When necessary hydralazine added. May consider increasing lisinopril or adding a second agent. 10. Tobacco abuse: Cessation counseled. Declined nicotine patch. 11. Variable AV block: Asymptomatic. Noted on telemetry. Cardiology input appreciated and suspect this is related to metabolic/electrolyte issues-replace as needed. No further recommendations made by cardiology. Discussed with cardiology.   DVT prophylaxis: SCD's Code Status: Full Family Communication: None at bedside Disposition: DC home when medically improved, possibly 6/30.   Consultants:  [facility]   Procedures:  None  Antimicrobials:  IV Cipro and Flagyl    Subjective: States that diarrhea has resolved and is having normal BMs without blood or black color stools. Tolerating diet. Minimal intermittent abdominal pain which is improved.  ROS: No chest pain, dyspnea or palpitations reported.  Objective:  Vitals:   09/18/16 1840 09/19/16 0053 09/19/16 0616 09/19/16 1303  BP: (!) 183/97 (!) 150/92 (!) 166/83 (!) 168/94  Pulse:  71 67 68  Resp:  18 18 18   Temp:  98 F (36.7 C) 99.4 F (37.4 C) 99.3 F (37.4 C)  TempSrc:  Oral Oral Oral  SpO2:  99% 100% 100%  Weight:      Height:        Examination:  General exam: Pleasant middle-aged male lying comfortably supine in bed. Respiratory system: Clear to auscultation. Respiratory effort normal.Stable. Cardiovascular system: S1 and S2 heard. RRR. No JVD, murmurs or pedal edema. Telemetry: Sinus rhythm with first-degree AV block. Gastrointestinal system: Abdomen is nondistended, soft without tenderness. No organomegaly or masses felt. Normal bowel sounds heard.  Stable. Central  nervous system: Alert and oriented. No focal neurological deficits. Extremities: Symmetric 5 x 5 power. Skin: No rashes, lesions or ulcers Psychiatry: Judgement and insight appear normal. Mood & affect appropriate.     Data Reviewed: I have personally reviewed following labs and imaging studies  CBC:  Recent Labs Lab 09/17/16 0449 09/18/16 0458 09/19/16 0455  WBC 12.9* 7.5 8.5  NEUTROABS 12.0*  --   --   HGB 14.9 14.3 14.3  HCT 42.4 41.1 40.9  MCV 85.8 84.2 83.5  PLT 132* 128* 601*   Basic Metabolic Panel:  Recent Labs Lab 09/17/16 0449 09/17/16 1259 09/18/16 0458 09/19/16 0455  NA 135  --  137 137  K 2.9* 3.4* 3.0* 3.1*  CL 102  --  105 106  CO2 21*  --  26 23  GLUCOSE 159*  --  121* 108*  BUN 15  --  7 9  CREATININE 1.01  --  0.82 0.89  CALCIUM 8.8*  --  8.3* 8.5*  MG  --  1.6* 1.7 1.8   Liver Function Tests:  Recent Labs Lab 09/17/16 0449 09/18/16 0458 09/19/16 0455  AST 2,144* 302* 92*  ALT 1,354* 719* 450*  ALKPHOS 99 117 133*  BILITOT 2.9* 3.0* 1.8*  PROT 6.9 6.2* 6.4*  ALBUMIN 3.7 3.0* 3.1*   Coagulation Profile:  Recent Labs Lab 09/17/16 0612 09/18/16 0458  INR 1.33 1.19   CBG:  Recent Labs Lab 09/18/16 1635 09/18/16 2207 09/19/16 0744 09/19/16 1114 09/19/16 1640  GLUCAP 88 173* 93 124* 140*    No results found for this or any previous visit (from the past 240 hour(s)).       Radiology Studies: No results found.      Scheduled Meds: . folic acid  1 mg Oral Daily  . insulin aspart  0-15 Units Subcutaneous TID WC  . insulin aspart  0-5 Units Subcutaneous QHS  . lisinopril  20 mg Oral Daily  . multivitamin with minerals  1 tablet Oral Daily  . pantoprazole  40 mg Oral BID  . thiamine  100 mg Oral Daily   Or  . thiamine  100 mg Intravenous Daily   Continuous Infusions: . sodium chloride 100 mL/hr at 09/19/16 1649  . ciprofloxacin Stopped (09/19/16 1251)  . metronidazole Stopped (09/19/16 1359)     LOS: 2 days      Kyser Wandel, MD, FACP, FHM. [facility] Pager [phone]  If 7PM-7AM, please contact night-coverage [email or web address] Password [email or web address] 09/19/2016, 5:02 PM

## 2016-09-19 NOTE — Progress Notes (Signed)
[facility] Progress Note  Subjective: Continues asymptomatic, tolerating diet  Objective: Vital signs in last 24 hours: Temp:  [98 F (36.7 C)-99.7 F (37.6 C)] 99.4 F (37.4 C) (06/29 0616) Pulse Rate:  [67-85] 67 (06/29 0616) Resp:  [18-20] 18 (06/29 0616) BP: (150-185)/(83-97) 166/83 (06/29 0616) SpO2:  [99 %-100 %] 100 % (06/29 0616) Weight change:    PE: Abdomen nontender  Lab Results: Results for orders placed or performed during the hospital encounter of 09/17/16 (from the past 24 hour(s))  Glucose, capillary     Status: Abnormal   Collection Time: 09/18/16 11:24 AM  Result Value Ref Range   Glucose-Capillary 130 (H) 65 - 99 mg/dL  Glucose, capillary     Status: None   Collection Time: 09/18/16  4:35 PM  Result Value Ref Range   Glucose-Capillary 88 65 - 99 mg/dL  Glucose, capillary     Status: Abnormal   Collection Time: 09/18/16 10:07 PM  Result Value Ref Range   Glucose-Capillary 173 (H) 65 - 99 mg/dL  CBC     Status: Abnormal   Collection Time: 09/19/16  4:55 AM  Result Value Ref Range   WBC 8.5 4.0 - 10.5 K/uL   RBC 4.90 4.22 - 5.81 MIL/uL   Hemoglobin 14.3 13.0 - 17.0 g/dL   HCT 40.9 39.0 - 52.0 %   MCV 83.5 78.0 - 100.0 fL   MCH 29.2 26.0 - 34.0 pg   MCHC 35.0 30.0 - 36.0 g/dL   RDW 13.4 11.5 - 15.5 %   Platelets 140 (L) 150 - 400 K/uL  Comprehensive metabolic panel     Status: Abnormal   Collection Time: 09/19/16  4:55 AM  Result Value Ref Range   Sodium 137 135 - 145 mmol/L   Potassium 3.1 (L) 3.5 - 5.1 mmol/L   Chloride 106 101 - 111 mmol/L   CO2 23 22 - 32 mmol/L   Glucose, Bld 108 (H) 65 - 99 mg/dL   BUN 9 6 - 20 mg/dL   Creatinine, Ser 0.89 0.61 - 1.24 mg/dL   Calcium 8.5 (L) 8.9 - 10.3 mg/dL   Total Protein 6.4 (L) 6.5 - 8.1 g/dL   Albumin 3.1 (L) 3.5 - 5.0 g/dL   AST 92 (H) 15 - 41 U/L   ALT 450 (H) 17 - 63 U/L   Alkaline Phosphatase 133 (H) 38 - 126 U/L   Total Bilirubin 1.8 (H) 0.3 - 1.2 mg/dL   GFR calc non Af Amer  >60 >60 mL/min   GFR calc Af Amer >60 >60 mL/min   Anion gap 8 5 - 15  Magnesium     Status: None   Collection Time: 09/19/16  4:55 AM  Result Value Ref Range   Magnesium 1.8 1.7 - 2.4 mg/dL  Glucose, capillary     Status: None   Collection Time: 09/19/16  7:44 AM  Result Value Ref Range   Glucose-Capillary 93 65 - 99 mg/dL   Comment 1 Notify RN    Comment 2 Document in Chart     Studies/Results: US Abdomen Limited Ruq  Result Date: 09/17/2016 CLINICAL DATA:  Transaminitis, hypertension, diabetes. Hepatic CT attenuation abnormality may represent THAD, infarct, focal fat. EXAM: ULTRASOUND ABDOMEN LIMITED RIGHT UPPER QUADRANT COMPARISON:  None. FINDINGS: Gallbladder: Distended, without stones or wall thickening. There is a trace amount of pericholecystic fluid. Sonographer reports no sonographic Murphy's sign. Common bile duct: Diameter: 7 mm, unremarkable Liver: No focal lesion identified. Within normal limits in parenchymal  echogenicity. Antegrade color Doppler signal in the main portal vein. IMPRESSION: 1. Negative for cholelithiasis. 2. Small amount of pericholecystic fluid, nonspecific. Electronically Signed   By: Lucrezia Europe M.D.   On: 09/17/2016 14:00      Assessment: Diverticulitis Acute hepatitis from alcoholic, LFTs improving rapidly Rectal bleeding resolved  Plan: Continue present management, probable switch to oral antibiotics and possibly discharge tomorrow. We'll arrange follow-up with me for monitoring of LFTs and eventual colonoscopy.    Jovanni Rash C 09/19/2016, 9:39 AM  Pager [phone] If no answer or after 5 PM call [phone]

## 2016-09-20 DIAGNOSIS — Z720 Tobacco use: Secondary | ICD-10-CM

## 2016-09-20 LAB — COMPREHENSIVE METABOLIC PANEL
ALT: 260 U/L — ABNORMAL HIGH (ref 17–63)
ANION GAP: 9 (ref 5–15)
AST: 47 U/L — ABNORMAL HIGH (ref 15–41)
Albumin: 2.8 g/dL — ABNORMAL LOW (ref 3.5–5.0)
Alkaline Phosphatase: 113 U/L (ref 38–126)
BUN: 10 mg/dL (ref 6–20)
CHLORIDE: 107 mmol/L (ref 101–111)
CO2: 21 mmol/L — AB (ref 22–32)
Calcium: 8.7 mg/dL — ABNORMAL LOW (ref 8.9–10.3)
Creatinine, Ser: 0.78 mg/dL (ref 0.61–1.24)
GFR calc non Af Amer: >60 mL/min (ref >60)
Glucose, Bld: 105 mg/dL — ABNORMAL HIGH (ref 65–99)
POTASSIUM: 3.4 mmol/L — AB (ref 3.5–5.1)
SODIUM: 137 mmol/L (ref 135–145)
Total Bilirubin: 1.1 mg/dL (ref 0.3–1.2)
Total Protein: 6.2 g/dL — ABNORMAL LOW (ref 6.5–8.1)

## 2016-09-20 LAB — CBC
HCT: 40.2 % (ref 39.0–52.0)
Hemoglobin: 14.1 g/dL (ref 13.0–17.0)
MCH: 29.6 pg (ref 26.0–34.0)
MCHC: 35.1 g/dL (ref 30.0–36.0)
MCV: 84.5 fL (ref 78.0–100.0)
PLATELETS: 153 10*3/uL (ref 150–400)
RBC: 4.76 MIL/uL (ref 4.22–5.81)
RDW: 13.5 % (ref 11.5–15.5)
WBC: 10.7 10*3/uL — ABNORMAL HIGH (ref 4.0–10.5)

## 2016-09-20 LAB — GLUCOSE, CAPILLARY
GLUCOSE-CAPILLARY: 110 mg/dL — AB (ref 65–99)
Glucose-Capillary: 98 mg/dL (ref 65–99)

## 2016-09-20 MED ORDER — LISINOPRIL 20 MG PO TABS
30.0000 mg | ORAL_TABLET | Freq: Every day | ORAL | Status: DC
  Administered 2016-09-20: 11:00:00 30 mg via ORAL
  Filled 2016-09-20: qty 1

## 2016-09-20 MED ORDER — THIAMINE HCL 100 MG PO TABS: 100.0000 mg | ORAL_TABLET | Freq: Every day | ORAL | 0 refills | Status: DC

## 2016-09-20 MED ORDER — PANTOPRAZOLE SODIUM 40 MG PO TBEC: 40.0000 mg | DELAYED_RELEASE_TABLET | Freq: Every day | ORAL | 0 refills | Status: DC

## 2016-09-20 MED ORDER — FOLIC ACID 1 MG PO TABS: 1.0000 mg | ORAL_TABLET | Freq: Every day | ORAL | 0 refills | Status: DC

## 2016-09-20 MED ORDER — LISINOPRIL 20 MG PO TABS: 30.0000 mg | ORAL_TABLET | Freq: Every day | ORAL | 0 refills | Status: DC

## 2016-09-20 MED ORDER — METRONIDAZOLE 500 MG PO TABS: 500.0000 mg | ORAL_TABLET | Freq: Three times a day (TID) | ORAL | 0 refills | Status: AC

## 2016-09-20 MED ORDER — CIPROFLOXACIN HCL 500 MG PO TABS: 500.0000 mg | ORAL_TABLET | Freq: Two times a day (BID) | ORAL | 0 refills | Status: AC

## 2016-09-20 MED ORDER — ADULT MULTIVITAMIN W/MINERALS CH: 1.0000 | ORAL_TABLET | Freq: Every day | ORAL | Status: DC

## 2016-09-20 MED ORDER — POTASSIUM CHLORIDE CRYS ER 20 MEQ PO TBCR
30.0000 meq | EXTENDED_RELEASE_TABLET | ORAL | Status: AC
  Administered 2016-09-20 (×2): 30 meq via ORAL
  Filled 2016-09-20 (×2): qty 1

## 2016-09-20 NOTE — Care Management Note (Signed)
Case Management Note  Patient Details  Name: Caleb Hernandez MRN: [id number] Date of Birth: 1961-01-03  Subjective/Objective:    GI Bleed, ETOH,  HTN, DM               Action/Plan: Discharge Planning:  NCM spoke to pt and explained to call to reschedule appt if he is unable to make it on 09/23/16 at 56 at [facility] overflow clinic). Pt states he can afford his medications. He works and has money to pay for them. Explained NCM will review once his dc is placed to ensure they are affordable. Appointment is listed on dc instructions for home.   Expected Discharge Date:  09/20/2016               Expected Discharge Plan:  Home/Self Care  In-House Referral:  NA  Discharge planning Services  CM Consult, Follow-up appt scheduled, Medication Assistance  Post Acute Care Choice:  NA Choice offered to:  NA  DME Arranged:  N/A DME Agency:  NA  HH Arranged:  NA HH Agency:  NA  Status of Service:  Completed, signed off  If discussed at [place] of Stay Meetings, dates discussed:    Additional Comments:  Erenest Rasher, RN 09/20/2016, 10:25 AM

## 2016-09-20 NOTE — Discharge Summary (Signed)
Physician Discharge Summary  Caleb Hernandez NOB:[id number] DOB: 05-04-1960  PCP: Patient, No Pcp Per  Admit date: 09/17/2016 Discharge date: 09/20/2016  Recommendations for Outpatient Follow-up:  1. Cammie Sickle, FNP/New PCP on 09/23/2016 at 10:30 AM. To be seen with repeat labs (CBC & CMP). 2. Dr. Teena Irani, [facility] in 4 weeks.  Home Health: None Equipment/Devices: None    Discharge Condition: Improved and stable.  CODE STATUS: Full  Diet recommendation: Heart healthy diet.  Discharge Diagnoses:  Principal Problem:   GI bleed Active Problems:   Abdominal pain   HTN (hypertension)   DM2 (diabetes mellitus, type 2) (HCC)   Diverticulitis   Alcohol abuse   Tobacco use   Brief Summary: 56 year old male with PMH of HTN, DM, HLD, tobacco abuse, alcohol abuse presented to the ED with 3 days history of periumbilical/epigastric abdominal pain, nausea, 1 episode of nonbloody emesis, decreased oral intake, an episode of large bright red bleeding per rectum and dark black stools. Uses Advil for lower extremity pain. Reported colonoscopy 5 years ago and told to have polyps which were noncancerous. Noted to have R hemicolon diverticulitis, GI bleed and markedly abnormal LFTs. [facility] consulted.   Assessment & Plan:   1. Acute right hemicolon diverticulitis: DD: Infectious versus inflammatory. Treated supportively with bowel rest, IV fluids, IV Cipro and Flagyl. Diet was gradually advanced which she has tolerated. Clinically improved without any further nausea, vomiting or diarrhea. No bleeding reported. GI was consulted and planned to follow-up in the office with repeat LFTs and eventual colonoscopy. Transition to oral Cipro and Flagyl and completed total 10 days treatment. 2. Acute hepatitis:? Alcoholic. CT abdomen showed hepatic hypodensities. Ultrasound RUQ was negative for cholelithiasis. Acetaminophen level normal. Acute hepatitis panel negative. LFTs improving. GI follow-up  appreciated. Continues to improve. Outpatient follow-up of LFTs with GI upon discharge. Radiology recommends MRI of abdomen to evaluate hepatic hypodensities seen on CT which can be pursued as outpatient as deemed necessary. 3. Rectal bleeding: Probably was minimal given stability of hemoglobin. Resolved. Outpatient follow-up with GI. Advised to avoid NSAIDs, alcohol and tobacco. Continue PPI. 4. Hypokalemia: Continued to replace aggressively including on day of discharge. Follow BMP in a couple days as outpatient. 5. Hypomagnesemia: Replaced. 6. Thrombocytopenia:? Related to alcohol abuse. Resolved. 7. Alcohol abuse: Drinks 5-6 small cans of beer daily and more on weekends. Denies hard liquor. CIWA protocol. No overt withdrawal. 8. DM 2: Good inpatient control on SSI. Continue home metformin at discharge. 9. Essential hypertension: Mildly uncontrolled. Today increased lisinopril from 20 mg to 30 mg daily. Follow closely as outpatient and may consider adding a next agent i.e. amlodipine if not better controlled. 10. Tobacco abuse: Cessation counseled. Declined nicotine patch. 11. Variable AV block: Asymptomatic. Noted on telemetry. Cardiology input appreciated and suspect this is related to metabolic/electrolyte issues-replace as needed. No further recommendations made by cardiology.    Consultants:  [facility]   Procedures None   Discharge Instructions  Discharge Instructions    Call MD for:    Complete by:  As directed    Vomiting blood or coffee colored material. Blood in stools or black tarry stools.   Call MD for:  difficulty breathing, headache or visual disturbances    Complete by:  As directed    Call MD for:  extreme fatigue    Complete by:  As directed    Call MD for:  persistant dizziness or light-headedness    Complete by:  As directed  Call MD for:  persistant nausea and vomiting    Complete by:  As directed    Call MD for:  severe uncontrolled pain     Complete by:  As directed    Diet - low sodium heart healthy    Complete by:  As directed    Increase activity slowly    Complete by:  As directed        Medication List    TAKE these medications   ciprofloxacin 500 MG tablet Commonly known as:  CIPRO Take 1 tablet (500 mg total) by mouth 2 (two) times daily.   folic acid 1 MG tablet Commonly known as:  FOLVITE Take 1 tablet (1 mg total) by mouth daily. Start taking on:  09/21/2016   lisinopril 20 MG tablet Commonly known as:  PRINIVIL,ZESTRIL Take 1.5 tablets (30 mg total) by mouth daily. What changed:  how much to take   metFORMIN 500 MG tablet Commonly known as:  GLUCOPHAGE Take 500 mg by mouth daily.   metroNIDAZOLE 500 MG tablet Commonly known as:  FLAGYL Take 1 tablet (500 mg total) by mouth 3 (three) times daily.   multivitamin with minerals Tabs tablet Take 1 tablet by mouth daily. Start taking on:  09/21/2016   pantoprazole 40 MG tablet Commonly known as:  PROTONIX Take 1 tablet (40 mg total) by mouth daily.   thiamine 100 MG tablet Take 1 tablet (100 mg total) by mouth daily. Start taking on:  09/21/2016      Follow-up Information    Teena Irani, MD. Schedule an appointment as soon as possible for a visit in 4 week(s).   Specialty:  Gastroenterology Contact information: [address] [place] [address] [facility] Follow up on 09/23/2016.   Specialty:  Internal Medicine Why:  Please keep this appointment Tues July 3 at 1030 AM. To be seen with repeat labs (CBC & CMP). Contact information: [place] [address] [phone]         No Known Allergies    Procedures/Studies: Ct Abdomen Pelvis W Contrast  Result Date: 09/17/2016 CLINICAL DATA:  Epigastric pain for 2 days.  Leukocytosis. EXAM: CT ABDOMEN AND PELVIS WITH CONTRAST TECHNIQUE: Multidetector CT imaging of the abdomen and pelvis was performed using  the standard protocol following bolus administration of intravenous contrast. CONTRAST:  123mL ISOVUE-300 IOPAMIDOL (ISOVUE-300) INJECTION 61% COMPARISON:  None. FINDINGS: Lower chest: No acute abnormality. Hepatobiliary: Low-attenuation in the left lobe of the liver may represent transient hepatic attenuation changes. Additional scattered low-attenuation foci are indeterminate but more likely benign. Gallbladder and bile ducts are unremarkable. Pancreas: Unremarkable. No pancreatic ductal dilatation or surrounding inflammatory changes. Spleen: Normal in size without focal abnormality. Adrenals/Urinary Tract: Adrenal glands are unremarkable. Kidneys are normal, without renal calculi, focal lesion, or hydronephrosis. Bladder is unremarkable. Stomach/Bowel: Colonic diverticulosis. Acute inflammation of the hepatic flexure is centered on a diverticulum and probably represents acute diverticulitis. Appendix is normal. Stomach and small bowel are unremarkable. Vascular/Lymphatic: The abdominal aorta is normal in caliber with moderate atherosclerotic calcification. No adenopathy in the abdomen or pelvis. Reproductive: Unremarkable Other: No ascites. Musculoskeletal: No significant skeletal lesion. IMPRESSION: 1. Right hemicolon diverticulitis in the hepatic flexure. 2. Hepatic hypodensities are indeterminate but more likely benign. MRI should be considered for conclusive characterization. 3. Aortic atherosclerosis. Electronically Signed   By: Andreas Newport M.D.   On: 09/17/2016 06:11  US Abdomen Limited Ruq  Result Date: 09/17/2016 CLINICAL DATA:  Transaminitis, hypertension, diabetes. Hepatic CT attenuation abnormality may represent THAD, infarct, focal fat. EXAM: ULTRASOUND ABDOMEN LIMITED RIGHT UPPER QUADRANT COMPARISON:  None. FINDINGS: Gallbladder: Distended, without stones or wall thickening. There is a trace amount of pericholecystic fluid. Sonographer reports no sonographic Murphy's sign. Common bile  duct: Diameter: 7 mm, unremarkable Liver: No focal lesion identified. Within normal limits in parenchymal echogenicity. Antegrade color Doppler signal in the main portal vein. IMPRESSION: 1. Negative for cholelithiasis. 2. Small amount of pericholecystic fluid, nonspecific. Electronically Signed   By: Lucrezia Europe M.D.   On: 09/17/2016 14:00      Subjective: No nausea, vomiting or diarrhea. Having normal BMs without blood or black color stools. Tolerating diet. Minimal intermittent epigastric pain. Anxious to go home.  Discharge Exam:  Vitals:   09/19/16 2116 09/20/16 0012 09/20/16 0655 09/20/16 1158  BP: (!) 174/80 (!) 152/78 (!) 170/85 (!) 170/82  Pulse: 64 66 (!) 54 (!) 55  Resp: 18  18 16   Temp: 100.2 F (37.9 C)  98.5 F (36.9 C) 98.7 F (37.1 C)  TempSrc: Oral  Oral Oral  SpO2: 100%  100% 100%  Weight:      Height:        General exam: Pleasant middle-aged male lying comfortably supine in bed. Respiratory system: Clear to auscultation. Respiratory effort normal.Stable. Cardiovascular system: S1 and S2 heard. RRR. No JVD, murmurs or pedal edema. Telemetry: Sinus rhythm with first-degree AV block.Occasional sinus bradycardia in the 50s and occasional mild sinus tachycardia. Gastrointestinal system: Abdomen is nondistended, soft without tenderness. No organomegaly or masses felt. Normal bowel sounds heard.  Central nervous system: Alert and oriented. No focal neurological deficits. Extremities: Symmetric 5 x 5 power. Skin: No rashes, lesions or ulcers Psychiatry: Judgement and insight appear normal. Mood & affect appropriate.    The results of significant diagnostics from this hospitalization (including imaging, microbiology, ancillary and laboratory) are listed below for reference.     Microbiology: No results found for this or any previous visit (from the past 240 hour(s)).   Labs: CBC:  Recent Labs Lab 09/17/16 0449 09/18/16 0458 09/19/16 0455 09/20/16 0559  WBC  12.9* 7.5 8.5 10.7*  NEUTROABS 12.0*  --   --   --   HGB 14.9 14.3 14.3 14.1  HCT 42.4 41.1 40.9 40.2  MCV 85.8 84.2 83.5 84.5  PLT 132* 128* 140* 867   Basic Metabolic Panel:  Recent Labs Lab 09/17/16 0449 09/17/16 1259 09/18/16 0458 09/19/16 0455 09/20/16 0559  NA 135  --  137 137 137  K 2.9* 3.4* 3.0* 3.1* 3.4*  CL 102  --  105 106 107  CO2 21*  --  26 23 21*  GLUCOSE 159*  --  121* 108* 105*  BUN 15  --  7 9 10   CREATININE 1.01  --  0.82 0.89 0.78  CALCIUM 8.8*  --  8.3* 8.5* 8.7*  MG  --  1.6* 1.7 1.8  --    Liver Function Tests:  Recent Labs Lab 09/17/16 0449 09/18/16 0458 09/19/16 0455 09/20/16 0559  AST 2,144* 302* 92* 47*  ALT 1,354* 719* 450* 260*  ALKPHOS 99 117 133* 113  BILITOT 2.9* 3.0* 1.8* 1.1  PROT 6.9 6.2* 6.4* 6.2*  ALBUMIN 3.7 3.0* 3.1* 2.8*   CBG:  Recent Labs Lab 09/19/16 1114 09/19/16 1640 09/19/16 2110 09/20/16 0756 09/20/16 1153  GLUCAP 124* 140* 108* 98 110*  Time coordinating discharge: Over 30 minutes  SIGNED:  Vernell Leep, MD, FACP, [place]. [facility] Pager [phone]  If 7PM-7AM, please contact night-coverage [email or web address] Password [email or web address] 09/20/2016, 12:46 PM

## 2016-09-20 NOTE — Progress Notes (Signed)
Pt discharge to home instruction reviewed with pt acknowledge understanding

## 2016-09-23 ENCOUNTER — Ambulatory Visit: Payer: Self-pay | Admitting: Family Medicine

## 2016-09-26 DIAGNOSIS — IMO0002 Reserved for concepts with insufficient information to code with codable children: Secondary | ICD-10-CM

## 2016-09-26 DIAGNOSIS — D72825 Bandemia: Secondary | ICD-10-CM | POA: Insufficient documentation

## 2016-09-26 DIAGNOSIS — I63441 Cerebral infarction due to embolism of right cerebellar artery: Secondary | ICD-10-CM

## 2016-09-26 DIAGNOSIS — F1090 Alcohol use, unspecified, uncomplicated: Secondary | ICD-10-CM

## 2016-09-26 HISTORY — DX: Alcohol use, unspecified, uncomplicated: F10.90

## 2016-09-26 HISTORY — DX: Bandemia: D72.825

## 2016-09-26 HISTORY — DX: Reserved for concepts with insufficient information to code with codable children: IMO0002

## 2016-09-26 HISTORY — DX: Cerebral infarction due to embolism of right cerebellar artery: I63.441

## 2016-09-27 DIAGNOSIS — A530 Latent syphilis, unspecified as early or late: Secondary | ICD-10-CM

## 2016-09-27 HISTORY — DX: Latent syphilis, unspecified as early or late: A53.0

## 2016-10-02 ENCOUNTER — Encounter: Payer: Self-pay | Admitting: Family Medicine

## 2016-10-02 ENCOUNTER — Ambulatory Visit (INDEPENDENT_AMBULATORY_CARE_PROVIDER_SITE_OTHER): Payer: Self-pay | Admitting: Family Medicine

## 2016-10-02 VITALS — BP 136/90 | HR 84 | Temp 98.5°F | Resp 16 | Ht 65.0 in | Wt 153.4 lb

## 2016-10-02 DIAGNOSIS — I440 Atrioventricular block, first degree: Secondary | ICD-10-CM

## 2016-10-02 DIAGNOSIS — I639 Cerebral infarction, unspecified: Secondary | ICD-10-CM

## 2016-10-02 DIAGNOSIS — R932 Abnormal findings on diagnostic imaging of liver and biliary tract: Secondary | ICD-10-CM

## 2016-10-02 DIAGNOSIS — R7989 Other specified abnormal findings of blood chemistry: Secondary | ICD-10-CM

## 2016-10-02 DIAGNOSIS — I10 Essential (primary) hypertension: Secondary | ICD-10-CM

## 2016-10-02 DIAGNOSIS — R945 Abnormal results of liver function studies: Secondary | ICD-10-CM

## 2016-10-02 DIAGNOSIS — Z8619 Personal history of other infectious and parasitic diseases: Secondary | ICD-10-CM

## 2016-10-02 DIAGNOSIS — R7303 Prediabetes: Secondary | ICD-10-CM

## 2016-10-02 LAB — POCT URINALYSIS DIP (DEVICE)
BILIRUBIN URINE: NEGATIVE
Glucose, UA: NEGATIVE mg/dL
HGB URINE DIPSTICK: NEGATIVE
Ketones, ur: NEGATIVE mg/dL
LEUKOCYTES UA: NEGATIVE
NITRITE: NEGATIVE
Protein, ur: NEGATIVE mg/dL
SPECIFIC GRAVITY, URINE: 1.015 (ref 1.005–1.030)
Urobilinogen, UA: 0.2 mg/dL (ref 0.0–1.0)
pH: 5.5 (ref 5.0–8.0)

## 2016-10-02 LAB — MAGNESIUM: Magnesium: 1.4 mg/dL — ABNORMAL LOW (ref 1.5–2.5)

## 2016-10-02 LAB — CBC WITH DIFFERENTIAL/PLATELET
BASOS ABS: 0 {cells}/uL (ref 0–200)
BASOS PCT: 0 %
EOS ABS: 90 {cells}/uL (ref 15–500)
Eosinophils Relative: 1 %
HCT: 41.8 % (ref 38.5–50.0)
HEMOGLOBIN: 13.9 g/dL (ref 13.2–17.1)
LYMPHS ABS: 3060 {cells}/uL (ref 850–3900)
Lymphocytes Relative: 34 %
MCH: 28.9 pg (ref 27.0–33.0)
MCHC: 33.3 g/dL (ref 32.0–36.0)
MCV: 86.9 fL (ref 80.0–100.0)
MPV: 9.1 fL (ref 7.5–12.5)
Monocytes Absolute: 360 cells/uL (ref 200–950)
Monocytes Relative: 4 %
NEUTROS ABS: 5490 {cells}/uL (ref 1500–7800)
Neutrophils Relative %: 61 %
Platelets: 476 10*3/uL — ABNORMAL HIGH (ref 140–400)
RBC: 4.81 MIL/uL (ref 4.20–5.80)
RDW: 13.6 % (ref 11.0–15.0)
WBC: 9 10*3/uL (ref 3.8–10.8)

## 2016-10-02 LAB — COMPLETE METABOLIC PANEL WITH GFR
ALBUMIN: 3.6 g/dL (ref 3.6–5.1)
ALK PHOS: 100 U/L (ref 40–115)
ALT: 23 U/L (ref 9–46)
AST: 16 U/L (ref 10–35)
BILIRUBIN TOTAL: 0.5 mg/dL (ref 0.2–1.2)
BUN: 10 mg/dL (ref 7–25)
CO2: 23 mmol/L (ref 20–31)
CREATININE: 0.82 mg/dL (ref 0.70–1.33)
Calcium: 9 mg/dL (ref 8.6–10.3)
Chloride: 102 mmol/L (ref 98–110)
GFR, Est African American: >89 mL/min (ref >=60)
Glucose, Bld: 157 mg/dL — ABNORMAL HIGH (ref 65–99)
Potassium: 3.5 mmol/L (ref 3.5–5.3)
SODIUM: 136 mmol/L (ref 135–146)
TOTAL PROTEIN: 6.7 g/dL (ref 6.1–8.1)

## 2016-10-02 MED ORDER — CLONIDINE HCL 0.1 MG PO TABS
0.1000 mg | ORAL_TABLET | Freq: Once | ORAL | Status: AC
  Administered 2016-10-02: 0.1 mg via ORAL

## 2016-10-02 MED ORDER — LISINOPRIL 20 MG PO TABS: 30.0000 mg | ORAL_TABLET | Freq: Every day | ORAL | 2 refills | Status: DC

## 2016-10-02 MED FILL — LISINOPRIL 20 MG TABLET: 20 | 30 days supply | Qty: 45 | Fill #0

## 2016-10-02 NOTE — Progress Notes (Signed)
Patient ID: Caleb Hernandez, male    DOB: 06-May-1960, 56 y.o.   MRN: [id number]  PCP: Scot Jun, FNP  Chief Complaint  Patient presents with  . Establish Care  . Hospitalization Follow-up    STROKE    Subjective:  HPI  Caleb Hernandez is a 56 y.o. male presents to establish care and hospital follow-up. Medical problems include: CVA, uncontrolled hypertension, type 2 diabetes, alcohol abuse, tobacco use, and diverticulitis. Caleb Hernandez has had 2 recent hospital admissions less than 30 days.   GI bleed follow-up First admission 09/17/2016 at [facility] was admitted for diverticulitis of the colon. He presented with a three-day complaint of significant abdominal pain, nausea, and persistent emesis, and rectal bleeding. During this admission he was found to have abnormally elevated LFTs, a GI bleed, significant electrolyte abnormalities, AV block which she was asymptomatic, and questionable acute hepatitis (CT of the abdomen noted hypodensities of the liver with recommended MRI follow-up). He was hospitalized for a total of 3 days, placed on GI rest, IV Cipro and Flagyl. He was discharged home with oral antibiotics of Cipro and Flagyl which she reports today that he did complete the treatment and has had no further episodes of abdominal pain/rectal bleeding/nausea or vomiting. Reports that he has not had any recent alcohol. At discharge he was referred to Dr. Teena Irani at [facility] for which he reports today that he has not scheduled an appointment.   CVA Caleb Hernandez, subsequently presented to [facility] on 09/26/2016 with complaint of left-sided facial drooping and slurring of speech. He was found to have had an acute stroke. CT results were as follow: Acute small RIGHT centrum semiovale and RIGHT cerebellar infarcts. Subacute RIGHT basal ganglia small vessel infarcts. 2. Old bilateral basal ganglia and LEFT thalamus lacunar infarcts. Old small RIGHT  cerebellar infarcts. 3. Moderate chronic small vessel ischemic disease. 4. Mild parenchymal brain volume loss for age. Electronically Signed By: Elon Alas M.D. On: 09/26/2016 15:18. Caleb Hernandez reports today that he is continuing to have some weakness with walking, some mild dizziness, and continues to experience some speech issues. He denies any headache or blurring of vision. Upon discharge he was placed on Eliquis 5 mg twice a day and Pravachol 20 mg once daily. He reports that he is scheduled to start stroke rehabilitation services at [facility] next week. Patient is uninsured and therefore will need medication assistance however reports he was able to get his medications filled after discharge from hospital, with the exception he did not pick up his lisinopril for his high blood pressure. He admits that he continues to smoke is not interested in cessation.    Social History   Social History  . Marital status: Single    Spouse name: N/A  . Number of children: N/A  . Years of education: N/A   Occupational History  . Not on file.   Social History Main Topics  . Smoking status: Current Every Day Smoker  . Smokeless tobacco: Never Used  . Alcohol use Yes     Comment: 40 oz per day  . Drug use: No  . Sexual activity: Not on file   Other Topics Concern  . Not on file   Social History Narrative  . No narrative on file    History reviewed. No pertinent family history. Review of Systems See history of present illness Patient Active Problem List   Diagnosis Date Noted  . Abdominal pain 09/17/2016  . GI bleed 09/17/2016  .  HTN (hypertension) 09/17/2016  . DM2 (diabetes mellitus, type 2) ([place]) 09/17/2016  . Diverticulitis 09/17/2016  . Alcohol abuse 09/17/2016  . Tobacco use 09/17/2016    No Known Allergies  Prior to Admission medications   Medication Sig Start Date End Date Taking? Authorizing Provider  folic acid (FOLVITE) 1 MG tablet Take 1 tablet (1 mg total)  by mouth daily. 09/21/16  Yes Hongalgi, Lenis Dickinson, MD  lisinopril (PRINIVIL,ZESTRIL) 20 MG tablet Take 1.5 tablets (30 mg total) by mouth daily. 09/20/16  Yes Hongalgi, Lenis Dickinson, MD  metFORMIN (GLUCOPHAGE) 500 MG tablet Take 500 mg by mouth daily.    Yes [provider]  Multiple Vitamin (MULTIVITAMIN WITH MINERALS) TABS tablet Take 1 tablet by mouth daily. 09/21/16  Yes Hongalgi, Lenis Dickinson, MD  pantoprazole (PROTONIX) 40 MG tablet Take 1 tablet (40 mg total) by mouth daily. 09/20/16  Yes Hongalgi, Lenis Dickinson, MD  thiamine 100 MG tablet Take 1 tablet (100 mg total) by mouth daily. 09/21/16  Yes Hongalgi, Lenis Dickinson, MD    Past Medical, Surgical Family and Social History reviewed and updated.    Objective:   Today's Vitals   10/02/16 0821  BP: (!) 160/90  Pulse: 84  Resp: 16  Temp: 98.5 F (36.9 C)  TempSrc: Oral  SpO2: 99%  Weight: 153 lb 6.4 oz (69.6 kg)  Height: 5\' 5"  (1.651 m)    Wt Readings from Last 3 Encounters:  10/02/16 153 lb 6.4 oz (69.6 kg)  09/17/16 158 lb 4.6 oz (71.8 kg)  04/08/16 158 lb (71.7 kg)    Physical Exam  Constitutional: He is oriented to person, place, and time. He appears well-developed and well-nourished.  HENT:  Head: Normocephalic and atraumatic.  Eyes: Pupils are equal, round, and reactive to light. Conjunctivae and EOM are normal.  Neck: Normal range of motion. Neck supple.  Cardiovascular: Normal rate, regular rhythm, normal heart sounds and intact distal pulses.   Pulmonary/Chest: Effort normal and breath sounds normal.  Abdominal: Soft. Bowel sounds are normal. He exhibits no distension and no mass. There is no tenderness. There is no rebound and no guarding.  Musculoskeletal: Normal range of motion.  Neurological: He is alert and oriented to person, place, and time. He displays no tremor. Coordination and gait abnormal. GCS eye subscore is 4. GCS verbal subscore is 5. GCS motor subscore is 6.  Antalgic gait, 4 out of 5 strength on left side compared  to 5 out of 5 strength on right side upper extremities and lower extremities  Skin: Skin is warm and dry.  Psychiatric: He has a normal mood and affect. His behavior is normal. Judgment and thought content normal.     Assessment & Plan:  1. Cerebrovascular accident (CVA), unspecified mechanism ([place]), recent 09/26/2016, tx at [place] prescription refill [facility] -Lisinopril prescription sent to [facility] -Keep follow-up with stroke rehabilitation center -Ambulatory referral Neurology   2. Essential hypertension, uncontrolled today blood pressure is 160/90. Will administer 1 dose of Catapres 0.1 mg here in office. Patient's prescription for lisinopril has been sent to [facility] in order for him to  afford medications and increase compliance. - CBC with Differential - COMPLETE METABOLIC PANEL WITH GFR - cloNIDine (CATAPRES) tablet 0.1 mg; Take 1 tablet (0.1 mg total) by mouth once. -Lisinopril 30 mg once daily -Smoking cessation reinforced but declined by patient,  3. Hx of syphilis, recent hospitalization showed a positive  RPR with a 1:2 ratio,  will repeat today to rule out recent infection. Patient reports a history of syphilis and reports that he was treated greater than 10 years ago. - RPR, pending  4. Hypomagnesemia - Magnesium  5. Prediabetes - HM DIABETES FOOT EXAM -Continue metformin 500 mg twice a day with meals -Most recent hemoglobin A1c is 6.3  6. Abnormal LFTs  Repeating CMP today.  7. Abnormal CT of liver -MRI of the abdomen pending. Patient has been given a call from health patient assistance application today.  8. Heart block AV first degree -Ambulatory referral to cardiology for further evaluation.   RTC:  1 month hypertension follow-up and repeat CMP to monitor liver and electrolyte status.  Carroll Sage. Kenton Kingfisher, MSN, FNP-C [facility]   [address] [place], [place] [address] [phone]

## 2016-10-03 LAB — RPR

## 2016-10-05 DIAGNOSIS — I639 Cerebral infarction, unspecified: Secondary | ICD-10-CM

## 2016-10-05 HISTORY — DX: Cerebral infarction, unspecified: I63.9

## 2016-10-05 MED ORDER — METFORMIN HCL 500 MG PO TABS: 500.0000 mg | ORAL_TABLET | Freq: Two times a day (BID) | ORAL | 1 refills | Status: DC

## 2016-10-05 MED ORDER — PANTOPRAZOLE SODIUM 40 MG PO TBEC: 40.0000 mg | DELAYED_RELEASE_TABLET | Freq: Every day | ORAL | 1 refills | Status: DC

## 2016-10-05 MED ORDER — MAGNESIUM 250 MG PO TABS: 250.0000 mg | ORAL_TABLET | Freq: Every day | ORAL | 0 refills | Status: AC

## 2016-10-05 MED ORDER — PRAVASTATIN SODIUM 20 MG PO TABS: 20.0000 mg | ORAL_TABLET | Freq: Every day | ORAL | 1 refills | Status: DC

## 2016-10-05 MED ORDER — THIAMINE HCL 100 MG PO TABS: 100.0000 mg | ORAL_TABLET | Freq: Every day | ORAL | 1 refills | Status: DC

## 2016-10-05 MED ORDER — FOLIC ACID 1 MG PO TABS
1.0000 mg | ORAL_TABLET | Freq: Every day | ORAL | 1 refills | Status: DC
Start: 2016-10-05 — End: 2017-04-27

## 2016-10-05 MED ORDER — APIXABAN 5 MG PO TABS: 5.0000 mg | ORAL_TABLET | Freq: Two times a day (BID) | ORAL | 3 refills | Status: DC

## 2016-10-05 NOTE — Addendum Note (Signed)
Addended by: Scot Jun on: 10/05/2016 03:22 PM   Modules accepted: Orders

## 2016-10-06 ENCOUNTER — Telehealth: Payer: Self-pay

## 2016-10-06 ENCOUNTER — Other Ambulatory Visit: Payer: Self-pay | Admitting: Family Medicine

## 2016-10-06 DIAGNOSIS — R932 Abnormal findings on diagnostic imaging of liver and biliary tract: Secondary | ICD-10-CM

## 2016-10-06 MED FILL — ?METFORMIN HCL 500MG TABLET: 500 | 30 days supply | Qty: 60 | Fill #0

## 2016-10-06 MED FILL — PRAVASTATIN NA 20 MG TAB: 20 | 30 days supply | Qty: 30 | Fill #0

## 2016-10-06 MED FILL — !ELIQUIS 5 MG TABLET: 5 | 30 days supply | Qty: 60 | Fill #0

## 2016-10-06 MED FILL — ?PANTOPRAZOLE SOD DR 40MG: 40 MG | 30 days supply | Qty: 30 | Fill #0

## 2016-10-06 MED FILL — FOLIC ACID 1 MG TABLET: 1 | 30 days supply | Qty: 30 | Fill #0

## 2016-10-06 NOTE — Telephone Encounter (Signed)
-----   Message from Scot Jun, [place] sent at 10/05/2016 11:35 AM EDT ----- Regarding: Schedule MRI  Please schedule MRI of the abdomen for patient. Reason, recent abnormal CT showed hypo densities of the liver. Please notify patient when test is scheduled

## 2016-10-06 NOTE — Progress Notes (Signed)
Changing order for MR Abdomen to with/without contrast

## 2016-10-06 NOTE — Telephone Encounter (Signed)
Patient notified that appointment is on 10/10/2016 at [facility] for MRI

## 2016-10-06 NOTE — Telephone Encounter (Signed)
Left a vm for patient to callback regarding MRI appointment on 10/10/2016 at 7am.

## 2016-10-06 NOTE — Telephone Encounter (Signed)
MRI needs to be changed to without or with and without

## 2016-10-06 NOTE — Telephone Encounter (Signed)
-----   Message from Scot Jun, [place] sent at 10/05/2016 11:35 AM EDT ----- Regarding: Schedule MRI  Please schedule MRI of the abdomen for patient. Reason, recent abnormal CT showed hypo densities of the liver. Please notify patient when test is scheduled

## 2016-10-06 NOTE — Telephone Encounter (Signed)
-----   Message from Scot Jun, [place] sent at 10/05/2016 11:35 AM EDT ----- Regarding: Schedule MRI  Please schedule MRI of the abdomen for patient. Reason, recent abnormal CT showed hypo densities of the liver. Please notify patient when test is scheduled

## 2016-10-07 ENCOUNTER — Ambulatory Visit (INDEPENDENT_AMBULATORY_CARE_PROVIDER_SITE_OTHER): Payer: Self-pay | Admitting: Cardiology

## 2016-10-07 VITALS — BP 166/96 | HR 75 | Ht 64.0 in | Wt 150.4 lb

## 2016-10-07 DIAGNOSIS — Z7901 Long term (current) use of anticoagulants: Secondary | ICD-10-CM | POA: Insufficient documentation

## 2016-10-07 DIAGNOSIS — I10 Essential (primary) hypertension: Secondary | ICD-10-CM

## 2016-10-07 DIAGNOSIS — I639 Cerebral infarction, unspecified: Secondary | ICD-10-CM

## 2016-10-07 DIAGNOSIS — I440 Atrioventricular block, first degree: Secondary | ICD-10-CM

## 2016-10-07 MED ORDER — AMLODIPINE BESYLATE 5 MG PO TABS: 5.0000 mg | ORAL_TABLET | Freq: Every day | ORAL | 3 refills | Status: DC

## 2016-10-07 NOTE — Patient Instructions (Addendum)
Medication Instructions:  Your physician has recommended you make the following change in your medication:  START amlodopine 5 mg daily   Labwork: None  Testing/Procedures: Your physician has requested that you have a TEE. During a TEE, sound waves are used to create images of your heart. It provides your doctor with information about the size and shape of your heart and how well your heart's chambers and valves are working. In this test, a transducer is attached to the end of a flexible tube that's guided down your throat and into your esophagus (the tube leading from you mouth to your stomach) to get a more detailed image of your heart. You are not awake for the procedure. Please see the instruction sheet given to you today. For further information please visit [email or web address].[email or web address].  Your physician has recommended that you wear a holter monitor. Holter monitors are medical devices that record the heart's electrical activity. Doctors most often use these monitors to diagnose arrhythmias. Arrhythmias are problems with the speed or rhythm of the heartbeat. The monitor is a small, portable device. You can wear one while you do your normal daily activities. This is usually used to diagnose what is causing palpitations/syncope (passing out).   You had an EKG today.  Follow-Up: Your physician recommends that you schedule a follow-up appointment in: 6 weeks.   Any Other Special Instructions Will Be Listed Below (If Applicable).     If you need a refill on your cardiac medications before your next appointment, please call your pharmacy.

## 2016-10-07 NOTE — Progress Notes (Signed)
Cardiology Office Note:    Date:  10/07/2016   ID:  Caleb Hernandez, DOB [birth year], MRN [id number]  PCP:  Scot Jun, FNP  Cardiologist:  Shirlee More, MD   Referring MD: Scot Jun, FNP  ASSESSMENT:    1. Cerebellar stroke, acute ([place])   2. Chronic anticoagulation   3. Essential hypertension    PLAN:    In order of problems listed above:  1. This is a young man has had multiple cerebellar strokes and was placed on anticoagulant therapy by the neurologist as it was felt to be embolic. There was a statement in the consultation that he should have a implanted loop recorder. He is fully anticoagulated and I think he should have further evaluation and asked him to schedule transesophageal echo to assess for left atrial thrombus and I think an implanted loop recorder is appropriate especially has a history of previous GI bleed to make a precise diagnosis of whether he has a cardiac source of stroke particularly atrial fibrillation. Despite his young age he has increased risk with diabetes hypertension and marked first-degree heart block secondary a marker of atrial myopathy. The left atrium was not abnormal and echocardiogram. 2. Stable continue his current anticoagulant without bleeding complication 3. Poorly controlled, he relates that for approximately year prior to stroke he had not taken blood pressure medications because of cost he is compliant now sodium restriction I does second antihypertensive agent unknown rate limiting calcium channel blocker with arrangements for BP follow-up in the office. 4. He will continue current treatment for hyperlipidemia and diabetes.  Next appointment In 6 weeks   Medication Adjustments/Labs and Tests Ordered: Current medicines are reviewed at length with the patient today.  Concerns regarding medicines are outlined above.  No orders of the defined types were placed in this encounter.  No orders of the defined types were placed in  this encounter.    Chief Complaint  Patient presents with  . New Patient (Initial Visit)    to evaluate 1srt degree AV Block    History of Present Illness:    Caleb Hernandez is a 56 y.o. male with Hypertension, hyperlipidemia, DM and recent cerebellar strokewho is being seen today for cardiology evaluation for implanted loop recorder at the request of Scot Jun, FNP. [name] admission: Admit date: 09/26/2016 Discharge date: 09/27/2016  Discharge Service: General Medicine (MED) Discharge Attending Physician: Consuello Masse, MD Discharge to: Home Discharge Diagnoses: Principal Problem: Cerebellar stroke, acute (CMS-HCC) Active Problems: Tobacco use Essential hypertension Type 2 diabetes mellitus, without long-term current use of insulin (CMS-HCC) Alcohol use disorder (CMS-HCC) Bandemia Cerebral infarction due to embolism of right middle cerebral artery (CMS-HCC) Cerebral infarction due to embolism of right cerebellar artery (CMS-HCC) Positive RPR test  Hospital Course:  [name] is a 56 year old man who presented to the hospital with left-sided weakness, left facial palsy and slurred speech. He was found to have acute on chronic stroke. He was evaluated by the neurologist, Dr. Alba Destine, who recommended Eliquis for stroke prophylaxis. Risks, benefits and alternatives to anticoagulation were discussed and patient is agreeable to proceed with anticoagulation. Left-sided weakness has resolved and slurred speech has improved. He was evaluated by PT, ST and OT but no further therapy was recommended. He has history of alcohol use disorder and tobacco use disorder and he's been advised to stop drinking alcohol and to stop smoking cigarettes. He had a positive RPR test but he says he was treated for syphilis over 10 years ago.  His condition has improved significantly and he is deemed stable for discharge to home today. The importance of medical adherence was emphasized. Patient was  provided with a discount card for Eliquis. 56 year old patient with cryptogenic embolic strokes anterior and posterior circulation, as per discussion with medical team, the decision was made to proceed with anticoagulation. Neurology consulted at 14:21 hours. CTA head and neck did not show hemorrhage and intracranial occlusion. Not Interventional candidate. Not a TPA candidate given onset greater than 4.5 hours. Patient also has multiple risk factors including alcohol abuse and smoking. Counseling was given to the patient. Patient has a slight leukocytosis, blood cultures will be drawn. If blood cultures returned positive, then we will need to order a TEE. As outpatient, consider loop recorder to rule out paroxysmal A. fib.      Past Medical History:  Diagnosis Date  . Abdominal pain 09/17/2016  . Alcohol use disorder ([place]) 09/26/2016  . Allergic rhinitis 08/28/2012  . Bilateral bunions 12/18/2014  . Cerebellar stroke, acute ([place]) 10/05/2016  . Cerebral infarction due to embolism of right cerebellar artery ([place]) 09/26/2016  . Corns and callosity 08/28/2012  . Diabetes mellitus without complication ([place])   . Diverticulitis 09/17/2016  . Early cataracts, bilateral 01/03/2014  . Encounter for long-term (current) use of other medications 08/28/2012  . Essential hypertension 09/17/2016  . First degree heart block 08/28/2012  . GI bleed 09/17/2016  . High cholesterol   . Hypercholesterolemia 04/13/2013  . Hypertension   . Increased band cell count 09/26/2016  . Male hypogonadism 11/16/2013  . Nondependent alcohol abuse 09/17/2016  . Onychomycosis of toenail 12/18/2014  . Positive RPR test 09/27/2016  . Premature beats 08/28/2012  . Proteinuria 04/13/2013  . 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/2013  . Tinea pedis 08/28/2012  . Tinea pedis of both feet 12/18/2014  . Tobacco use 09/17/2016  . Type 2 diabetes mellitus, without long-term current use of insulin ([place]) 09/17/2016  . Undiagnosed cardiac murmurs 04/20/2012  .  Vitamin D deficiency 04/13/2013    Past Surgical History:  Procedure Laterality Date  . BACK SURGERY      Current Medications: Current Meds  Medication Sig  . apixaban (ELIQUIS) 5 MG TABS tablet Take 1 tablet (5 mg total) by mouth 2 (two) times daily.  . folic acid (FOLVITE) 1 MG tablet Take 1 tablet (1 mg total) by mouth daily.  Marland Kitchen lisinopril (PRINIVIL,ZESTRIL) 20 MG tablet Take 1.5 tablets (30 mg total) by mouth daily.  . Magnesium 250 MG TABS Take 1 tablet (250 mg total) by mouth daily.  . metFORMIN (GLUCOPHAGE) 500 MG tablet Take 1 tablet (500 mg total) by mouth 2 (two) times daily with a meal.  . Omega-3 Fatty Acids (FISH OIL) 600 MG CAPS Take 2 capsules by mouth 2 (two) times daily.  . pantoprazole (PROTONIX) 40 MG tablet Take 1 tablet (40 mg total) by mouth daily.  . pravastatin (PRAVACHOL) 20 MG tablet Take 1 tablet (20 mg total) by mouth daily.     Allergies:   Patient has no known allergies.   Social History   Social History  . Marital status: Single    Spouse name: N/A  . Number of children: N/A  . Years of education: N/A   Social History Main Topics  . Smoking status: Former Smoker    Quit date: 09/28/2016  . Smokeless tobacco: Never Used  . Alcohol use Yes     Comment: 40 oz per day  . Drug use: No  . Sexual activity:  Not on file   Other Topics Concern  . Not on file   Social History Narrative  . No narrative on file     Family History: The patient's family history includes Heart disease in his brother.  ROS:   ROS Please see the history of present illness.     All other systems reviewed and are negative.  EKGs/Labs/Other Studies Reviewed:    The following studies were reviewed today: Hospital records reviewed including emergency room neurology consultation admission and discharge note laboratory test EKG echocardiogram and brain MRI.  EKG:  EKG is  ordered today.  The ekg ordered today demonstrates marked first degree AVB OW normal PR .400  secs  TTE procedure: ECHOCARDIOGRAM W COLORFLOW SPECTRAL DOPPLER. Procedure date Date: 09/26/2016 Start: 05:04 PM Technical Quality: Adequate visualization Study Location: Portable Indications: TIA. Patient Status: Routine Contrast Medium: Bubble Study. Amount - 9 ml Height: 64.96 inches Weight: 147.71 pounds BSA: 1.74 m2 BMI: 24.61 kg/m2 BP: 145/75 mmHg Conclusions Summary Normal left ventricular size and systolic function with no appreciable segmental abnormality. Ejection fraction is visually estimated at 55-60% Normal right ventricle structure and function. No significant valvular abnormalities No evidence of patent foramen ovale by agitated saline contrast study   MRI Brain Wo Contrast7/08/2016 [facility] Result Impression  1. Acute small RIGHT centrum semiovale and RIGHT cerebellar infarcts. Subacute RIGHT basal ganglia small vessel infarcts. 2. Old bilateral basal ganglia and LEFT thalamus lacunar infarcts. Old small RIGHT cerebellar infarcts. 3. Moderate chronic small vessel ischemic disease. 4. Mild parenchymal brain volume loss for age.   Electronically Signed ByDeloria Lair M.D. On: 09/26/2016 15:18    Recent Labs: 10/02/2016: ALT 23; BUN 10; Creat 0.82; Hemoglobin 13.9; Magnesium 1.4; Platelets 476; Potassium 3.5; Sodium 136  Recent Lipid Panel No results found for: CHOL, TRIG, HDL, CHOLHDL, VLDL, LDLCALC, LDLDIRECT  Physical Exam:    VS:  Pulse 75   Ht 5\' 4"  (1.626 m)   Wt 150 lb 6.4 oz (68.2 kg)   SpO2 98%   BMI 25.82 kg/m     Wt Readings from Last 3 Encounters:  10/07/16 150 lb 6.4 oz (68.2 kg)  10/02/16 153 lb 6.4 oz (69.6 kg)  09/17/16 158 lb 4.6 oz (71.8 kg)     GEN:  Well nourished, well developed in no acute distress HEENT: Normal NECK: No JVD; No carotid bruits LYMPHATICS: No lymphadenopathy CARDIAC: RRR, no murmurs, rubs, gallops RESPIRATORY:  Clear to auscultation without rales, wheezing or rhonchi  ABDOMEN: Soft,  non-tender, non-distended MUSCULOSKELETAL:  No edema; No deformity  SKIN: Warm and dry NEUROLOGIC:  Alert and oriented x 3 PSYCHIATRIC:  Normal affect     Signed, Shirlee More, MD  10/07/2016 1:36 PM    [facility]

## 2016-10-10 ENCOUNTER — Ambulatory Visit (HOSPITAL_COMMUNITY)
Admission: RE | Admit: 2016-10-10 | Discharge: 2016-10-10 | Disposition: A | Discharge status: Home / Self Care | Payer: Self-pay | Source: Ambulatory Visit | Attending: Family Medicine | Admitting: Family Medicine

## 2016-10-10 DIAGNOSIS — K862 Cyst of pancreas: Secondary | ICD-10-CM | POA: Insufficient documentation

## 2016-10-10 DIAGNOSIS — E278 Other specified disorders of adrenal gland: Secondary | ICD-10-CM | POA: Insufficient documentation

## 2016-10-10 DIAGNOSIS — K5792 Diverticulitis of intestine, part unspecified, without perforation or abscess without bleeding: Secondary | ICD-10-CM | POA: Insufficient documentation

## 2016-10-10 DIAGNOSIS — K750 Abscess of liver: Secondary | ICD-10-CM | POA: Insufficient documentation

## 2016-10-10 DIAGNOSIS — I81 Portal vein thrombosis: Secondary | ICD-10-CM | POA: Insufficient documentation

## 2016-10-10 DIAGNOSIS — R932 Abnormal findings on diagnostic imaging of liver and biliary tract: Secondary | ICD-10-CM | POA: Insufficient documentation

## 2016-10-10 MED ORDER — GADOBENATE DIMEGLUMINE 529 MG/ML IV SOLN
20.0000 mL | Freq: Once | INTRAVENOUS | Status: AC | PRN
  Administered 2016-10-10: 20 mL via INTRAVENOUS

## 2016-10-13 ENCOUNTER — Ambulatory Visit: Payer: Self-pay | Attending: Internal Medicine

## 2016-10-13 ENCOUNTER — Other Ambulatory Visit: Payer: Self-pay | Admitting: Family Medicine

## 2016-10-13 DIAGNOSIS — K8689 Other specified diseases of pancreas: Secondary | ICD-10-CM

## 2016-10-13 DIAGNOSIS — R160 Hepatomegaly, not elsewhere classified: Secondary | ICD-10-CM

## 2016-10-13 DIAGNOSIS — I81 Portal vein thrombosis: Secondary | ICD-10-CM

## 2016-10-13 NOTE — Progress Notes (Signed)
Caleb Hernandez,  Please contact patient to advise I am referring him to Gastroenterology as his recent MRI was abnormal . MRI findings included: a liver mass and pancreatic head lesion. He was given the [facility] Patient Assistance Application which will assist with the cost of referral once approved.  Carroll Sage. Kenton Kingfisher, MSN, FNP-C [facility]  [address] [place], [place] [address] [phone]

## 2016-10-14 NOTE — Progress Notes (Signed)
Spoke with patient and he would like to have application mailed to him for the Patient Assistance. I put in boxed to be mailed out

## 2016-10-15 ENCOUNTER — Ambulatory Visit (HOSPITAL_COMMUNITY): Payer: Self-pay

## 2016-10-16 ENCOUNTER — Other Ambulatory Visit: Payer: Self-pay | Admitting: Cardiology

## 2016-10-16 ENCOUNTER — Encounter: Payer: Self-pay | Admitting: *Deleted

## 2016-10-16 ENCOUNTER — Encounter (INDEPENDENT_AMBULATORY_CARE_PROVIDER_SITE_OTHER): Payer: Self-pay

## 2016-10-16 DIAGNOSIS — I4891 Unspecified atrial fibrillation: Secondary | ICD-10-CM

## 2016-10-16 DIAGNOSIS — I440 Atrioventricular block, first degree: Secondary | ICD-10-CM

## 2016-10-16 DIAGNOSIS — I639 Cerebral infarction, unspecified: Secondary | ICD-10-CM

## 2016-10-16 NOTE — Progress Notes (Signed)
Patient ID: Caleb Hernandez, male   DOB: 1960/06/30, 56 y.o.   MRN: [id number] Patient did not show up for 10/16/16, appointment, to have a 48 hour holter monitor applied.

## 2016-10-17 ENCOUNTER — Ambulatory Visit (HOSPITAL_COMMUNITY): Admission: RE | Admit: 2016-10-17 | Payer: Self-pay | Source: Ambulatory Visit | Admitting: Internal Medicine

## 2016-10-17 ENCOUNTER — Encounter (HOSPITAL_COMMUNITY): Payer: Self-pay | Admitting: *Deleted

## 2016-10-17 ENCOUNTER — Encounter (HOSPITAL_COMMUNITY)
Admission: RE | Disposition: A | Discharge status: Home / Self Care | Payer: Self-pay | Source: Ambulatory Visit | Attending: Internal Medicine

## 2016-10-17 ENCOUNTER — Ambulatory Visit (HOSPITAL_COMMUNITY): Admit: 2016-10-17 | Payer: Self-pay | Admitting: Internal Medicine

## 2016-10-17 ENCOUNTER — Ambulatory Visit (HOSPITAL_COMMUNITY)
Admission: RE | Admit: 2016-10-17 | Discharge: 2016-10-17 | Disposition: A | Discharge status: Home / Self Care | Payer: Self-pay | Source: Ambulatory Visit | Attending: Internal Medicine | Admitting: Internal Medicine

## 2016-10-17 DIAGNOSIS — I639 Cerebral infarction, unspecified: Secondary | ICD-10-CM

## 2016-10-17 DIAGNOSIS — Z7901 Long term (current) use of anticoagulants: Secondary | ICD-10-CM | POA: Insufficient documentation

## 2016-10-17 DIAGNOSIS — Z7984 Long term (current) use of oral hypoglycemic drugs: Secondary | ICD-10-CM | POA: Insufficient documentation

## 2016-10-17 DIAGNOSIS — Z79899 Other long term (current) drug therapy: Secondary | ICD-10-CM | POA: Insufficient documentation

## 2016-10-17 DIAGNOSIS — I10 Essential (primary) hypertension: Secondary | ICD-10-CM | POA: Insufficient documentation

## 2016-10-17 DIAGNOSIS — F1010 Alcohol abuse, uncomplicated: Secondary | ICD-10-CM | POA: Insufficient documentation

## 2016-10-17 DIAGNOSIS — F17210 Nicotine dependence, cigarettes, uncomplicated: Secondary | ICD-10-CM | POA: Insufficient documentation

## 2016-10-17 DIAGNOSIS — E119 Type 2 diabetes mellitus without complications: Secondary | ICD-10-CM | POA: Insufficient documentation

## 2016-10-17 DIAGNOSIS — I440 Atrioventricular block, first degree: Secondary | ICD-10-CM

## 2016-10-17 DIAGNOSIS — K0889 Other specified disorders of teeth and supporting structures: Secondary | ICD-10-CM | POA: Insufficient documentation

## 2016-10-17 DIAGNOSIS — Z8673 Personal history of transient ischemic attack (TIA), and cerebral infarction without residual deficits: Secondary | ICD-10-CM | POA: Insufficient documentation

## 2016-10-17 DIAGNOSIS — K08409 Partial loss of teeth, unspecified cause, unspecified class: Secondary | ICD-10-CM | POA: Insufficient documentation

## 2016-10-17 DIAGNOSIS — E7800 Pure hypercholesterolemia, unspecified: Secondary | ICD-10-CM | POA: Insufficient documentation

## 2016-10-17 HISTORY — PX: LOOP RECORDER INSERTION: EP1214

## 2016-10-17 SURGERY — CANCELLED PROCEDURE

## 2016-10-17 SURGERY — LOOP RECORDER INSERTION

## 2016-10-17 MED ORDER — LIDOCAINE-EPINEPHRINE 1 %-1:100000 IJ SOLN
INTRAMUSCULAR | Status: AC
  Filled 2016-10-17: qty 1

## 2016-10-17 MED ORDER — LIDOCAINE-EPINEPHRINE 1 %-1:100000 IJ SOLN
INTRAMUSCULAR | Status: DC | PRN
  Administered 2016-10-17: 20 mL

## 2016-10-17 MED ORDER — SODIUM CHLORIDE 0.9 % IV SOLN
INTRAVENOUS | Status: DC
Start: 2016-10-17 — End: 2016-10-17
  Administered 2016-10-17: 11:00:00 via INTRAVENOUS

## 2016-10-17 MED ORDER — MIDAZOLAM HCL 5 MG/ML IJ SOLN
INTRAMUSCULAR | Status: AC
  Filled 2016-10-17: qty 2

## 2016-10-17 MED ORDER — FENTANYL CITRATE (PF) 100 MCG/2ML IJ SOLN
INTRAMUSCULAR | Status: AC
  Filled 2016-10-17: qty 2

## 2016-10-17 SURGICAL SUPPLY — 2 items
LOOP REVEAL LINQSYS (Prosthesis & Implant Heart) ×3 IMPLANT
PACK LOOP INSERTION (CUSTOM PROCEDURE TRAY) ×3 IMPLANT

## 2016-10-17 NOTE — Consult Note (Signed)
ELECTROPHYSIOLOGY CONSULT NOTE  Patient ID: Caleb Hernandez MRN: [id number], DOB/AGE: Oct 04, 1960   Admit date: 10/17/2016 Date of Consult: 10/17/2016  Primary Physician: Caleb Jun, FNP Primary Cardiologist: Caleb Hernandez Reason for Consultation: Cryptogenic stroke; recommendations regarding Implantable Loop Recorder  History of Present Illness Caleb Hernandez whom EP has been asked to see by Dr Caleb Hernandez to evaluate for placement of an implantable loop recorder to look for AF in the setting of cryptogenic stroke.  He was admitted recently with stroke and was seen in outpatient follow up recently by Dr Caleb Hernandez.  At that time, TEE and ILR was recommended at that time.  He has been on Eliquis but has a history of GT bleed. With no confirmed documentation of atrial fibrillation, loop recorder was recommended to confirm that long term anticoagulation was needed. Dr Caleb Hernandez evaluated patient for TEE; however, with poor dentition, there was concern for dislodging teeth during procedure and TEE has been cancelled.   Echocardiogram this admission demonstrated normal LV function, LA 36.  Lab work is reviewed.  The patient denies chest pain, shortness of breath, dizziness, palpitations, or syncope.  EP has been asked to evaluate for placement of an implantable loop recorder to monitor for atrial fibrillation.  Past Medical History:  Diagnosis Date  . Abdominal pain 09/17/2016  . Alcohol use disorder (Caleb Hernandez) 09/26/2016  . Allergic rhinitis 08/28/2012  . Bilateral bunions 12/18/2014  . Cerebellar stroke, acute (Caleb Hernandez) 10/05/2016  . 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 Corns and callosity 08/28/2012  . Diabetes mellitus without complication (Caleb Hernandez)   . Diverticulitis 09/17/2016  . Early cataracts, bilateral 01/03/2014  . Encounter for long-term (current) use of other medications 08/28/2012  . Essential hypertension 09/17/2016  . First degree heart block 08/28/2012  . GI  bleed 09/17/2016  . High cholesterol   . Hypercholesterolemia 04/13/2013  . Hypertension   . Increased band cell count 09/26/2016  . Male hypogonadism 11/16/2013  . Nondependent alcohol abuse 09/17/2016  . Onychomycosis of toenail 12/18/2014  . Positive RPR test 09/27/2016  . Premature beats 08/28/2012  . Proteinuria 04/13/2013  . 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/2013  . Tinea pedis 08/28/2012  . Tinea pedis of both feet 12/18/2014  . Tobacco use 09/17/2016  . Type 2 diabetes mellitus, without long-term current use of insulin (Caleb Hernandez) 09/17/2016  . Undiagnosed cardiac murmurs 04/20/2012  . Vitamin D deficiency 04/13/2013     Surgical History:  Past Surgical History:  Procedure Laterality Date  . BACK SURGERY       Prescriptions Prior to Admission  Medication Sig Dispense Refill Last Dose  . amLODipine (NORVASC) 5 MG tablet Take 1 tablet (5 mg total) by mouth daily. 180 tablet 3 10/17/2016 at Unknown time  . apixaban (ELIQUIS) 5 MG TABS tablet Take 1 tablet (5 mg total) by mouth 2 (two) times daily. 60 tablet 3 10/17/2016 at Unknown time  . folic acid (FOLVITE) 1 MG tablet Take 1 tablet (1 mg total) by mouth daily. 90 tablet 1 10/17/2016 at Unknown time  . lisinopril (PRINIVIL,ZESTRIL) 20 MG tablet Take 1.5 tablets (30 mg total) by mouth daily. 60 tablet 2 10/17/2016 at Unknown time  . Magnesium 250 MG TABS Take 1 tablet (250 mg total) by mouth daily. 30 tablet 0 10/17/2016 at Unknown time  . metFORMIN (GLUCOPHAGE) 500 MG tablet Take 1 tablet (500 mg total) by mouth 2 (two) times daily with a meal. 180 tablet 1 10/17/2016 at Unknown time  .  Omega-3 Fatty Acids (FISH OIL) 600 MG CAPS Take 2 capsules by mouth 2 (two) times daily.   10/17/2016 at Unknown time  . pantoprazole (PROTONIX) 40 MG tablet Take 1 tablet (40 mg total) by mouth daily. 90 tablet 1 10/17/2016 at Unknown time  . pravastatin (PRAVACHOL) 20 MG tablet Take 1 tablet (20 mg total) by mouth daily. 90 tablet 1 10/17/2016 at Unknown time    Inpatient  Medications:   Allergies: No Known Allergies  Social History   Social History  . Marital status: Single    Spouse name: N/A  . Number of children: N/A  . Years of education: N/A   Occupational History  . Not on file.   Social History Main Topics  . Smoking status: Former Smoker    Quit date: 09/28/2016  . Smokeless tobacco: Never Used  . Alcohol use Yes     Comment: 40 oz per day  . Drug use: No  . Sexual activity: Not on file   Other Topics Concern  . Not on file   Social History Narrative  . No narrative on file     Family History  Problem Relation Age of Onset  . Heart disease Brother       Review of Systems: All other systems reviewed and are otherwise negative except as noted above.  Physical Exam: Vitals:   10/17/16 1125  Pulse: 72  Resp: (!) 27  Temp: 97.7 F (36.5 C)  TempSrc: Oral  SpO2: 100%  Weight: 150 lb 6.4 oz (68.2 kg)  Height: 5\' 4"  (1.626 m)    GEN- The patient is chronically ill appearing, alert and oriented x 3 today.   Head- normocephalic, atraumatic Eyes-  Sclera clear, conjunctiva pink Ears- hearing intact Oropharynx- clear Neck- supple Lungs- Clear to ausculation bilaterally, normal work of breathing Heart- Regular rate and rhythm, no murmurs, rubs or gallops  GI- soft, NT, ND, + BS Extremities- no clubbing, cyanosis, or edema MS- no significant deformity or atrophy Skin- no rash or lesion Psych- euthymic mood, full affect   Labs:   Lab Results  Component Value Date   WBC 9.0 10/02/2016   HGB 13.9 10/02/2016   HCT 41.8 10/02/2016   MCV 86.9 10/02/2016   PLT 476 (H) 10/02/2016       12-lead ECG sinus rhythm, 1st degree AV block, iRBBB All prior EKG's in EPIC reviewed with no documented atrial fibrillation  Assessment and Plan:  1. Cryptogenic stroke The patient has had a cryptogenic stroke.  TEE has been cancelled 2/2 poor dentition.  I spoke at length with the patient about monitoring for afib with an  implantable loop recorder.  Risks, benefits, and alteratives to implantable loop recorder were discussed with the patient today.   At this time, the patient is very clear in their decision to proceed with implantable loop recorder.   Will leave on Eliquis for now, if no AF detected to date at follow up with Dr Caleb Hernandez, can consider discontinuation with ongoing ILR monitoring for AF.   Wound care was reviewed with the patient (keep incision clean and dry for 3 days).     Please call with questions.   Chanetta Marshall, NP 10/17/2016 12:54 PM  EP Attending  The patient is referred by Dr. Bettina Hernandez because of cryptogenic stroke. There is a question but no confirmation of atrial fib and the patient is not a good anti-coagulation candidate. His TEE was deferred due to very bad dentition. He has a couple of  teeth that are still attached but appear loose. I have discussed the indications, risks/benefits/goals/expectations of ILR Insertion and he wishes to proceed.   Mikle Bosworth.D.

## 2016-10-17 NOTE — Interval H&P Note (Signed)
History and Physical Interval Note:  10/17/2016 10:37 AM  Gerrianne Scale  has presented today for surgery, with the diagnosis of STROKE  The various methods of treatment have been discussed with the patient and family. After consideration of risks, benefits and other options for treatment, the patient has consented to  Procedure(s): TRANSESOPHAGEAL ECHOCARDIOGRAM (TEE) (N/A) as a surgical intervention .  The patient's history has been reviewed, patient examined, no change in status, stable for surgery.  I have reviewed the patient's chart and labs.  Questions were answered to the patient's satisfaction.     Fransico Him

## 2016-10-17 NOTE — Progress Notes (Signed)
Patient presented to Endoscopy lab for TEE to workup acute stroke.  According to Cardiology note, patient had had an acute CVA but Neuro workup felt this was not cardioembolic.  2D echo was normal with no valvular abnormalities and normal LVF.  Patient has several missing teeth and several very loose teeth in the front.  He is also fully anticoagulated.  Would not recommend proceeding with TEE at this time due to risk of losing teeth with aspiration during the procedure. Will cancel TEE.  Patient to proceed with ILR per Dr. Lovena Le.

## 2016-10-17 NOTE — Telephone Encounter (Signed)
P/c with Tiffany at [facility] the could not do TEE due to fact that patient has loose teeth, please contact to reschedule patient once this issue is resolved.cn

## 2016-10-17 NOTE — Discharge Instructions (Signed)
Keep incision clean and dry for 3 days We will remove strips of tape over incision at wound check appointment Appt with Dr Curt Bears cancelled for next week as Dr Lovena Le saw you today Keep follow up with Dr Bettina Gavia as scheduled

## 2016-10-17 NOTE — Telephone Encounter (Signed)
Will address with Dr. Bettina Gavia upon return.

## 2016-10-17 NOTE — H&P (View-Only) (Signed)
History and Physical    Vasili Fok FTD:[id number] DOB: Dec 23, 1960 DOA: 09/17/2016  PCP: Patient, No Pcp Per Patient coming from: Home  Chief Complaint: Carloyn Jaeger  HPI: Dontarious Schaum is a 56 y.o. male with medical history significant of hypertension and diabetes comes to the ER with complaints of abdominal pain. Patient states he has been having periumbilical and epigastric abdominal pain for the past 3 days which has somewhat progressed in intensity and at the time of ED arrival he states it was about 7/10 in intensity. He did have episode of nausea last 2 days with one episode of nonbloody vomiting. He has not had anything to eat all day since yesterday due to abdominal pain. Denies any fevers and chills. Yesterday evening he had large bright red bloody bowel movement which she has never experienced before. This morning he had dark black stool. Denies any symptoms of lightheadedness, dizziness, shortness of breath, palpitations and other complaints. Denies any recent weight loss or NSAID abuse. Reports she had colonoscopy about 5 years ago and was told he has polyps which were noncancerous otherwise doesn't know much about it. Reports of medical compliance with his metformin and lisinopril.  Admits of smoking 5-10 cigarettes daily, drinks 4-6 beers daily, no illicit drug use  Patient initially presented with the symptoms to [facility] ER with these complaints and at that time he was noted to have hemodynamically stable vital signs, hemoglobin of 14.9 which appears to be around his baseline. Creatinine was 1.01 and BUN of 15. He is noted to have significantly elevated AST of 2000 and ALT greater than 1000. Total bilirubin noted to be 2.9. He was noted to be hypokalemic therefore potassium replacement was ordered. GI was consult in.   Review of Systems: As per HPI otherwise 10 point review of systems negative.   Past Medical History:  Diagnosis Date  . Diabetes mellitus without  complication ([place])   . High cholesterol   . Hypertension     Past Surgical History:  Procedure Laterality Date  . BACK SURGERY       reports that he has been smoking.  He has never used smokeless tobacco. He reports that he drinks alcohol. He reports that he does not use drugs.  No Known Allergies  No family history on file.  Acceptable: Family history reviewed and not pertinent (If you reviewed it)  Prior to Admission medications   Medication Sig Start Date End Date Taking? Authorizing Provider  lisinopril (PRINIVIL,ZESTRIL) 20 MG tablet Take 20 mg by mouth daily.   Yes [provider]  dicyclomine (BENTYL) 20 MG tablet Take 1 tablet (20 mg total) by mouth 2 (two) times daily. 04/08/16   Delos Haring, PA-C  metFORMIN (GLUCOPHAGE) 500 MG tablet Take 500 mg by mouth 2 (two) times daily with a meal.    [provider]    Physical Exam: Vitals:   09/17/16 0800 09/17/16 0830 09/17/16 0935 09/17/16 0942  BP: (!) 140/99 (!) 155/95  (!) 146/86  Pulse: 93 96  87  Resp: (!) 29 (!) 25  (!) 22  Temp:    98.2 F (36.8 C)  TempSrc:    Oral  SpO2: 93% 96%  100%  Weight:   71.8 kg (158 lb 4.6 oz)   Height:   5\' 5"  (1.651 m)       Constitutional: NAD, calm, comfortable Vitals:   09/17/16 0800 09/17/16 0830 09/17/16 0935 09/17/16 0942  BP: (!) 140/99 (!) 155/95  (!) 146/86  Pulse: 93 96  87  Resp: (!) 29 (!) 25  (!) 22  Temp:    98.2 F (36.8 C)  TempSrc:    Oral  SpO2: 93% 96%  100%  Weight:   71.8 kg (158 lb 4.6 oz)   Height:   5\' 5"  (1.651 m)    Eyes: PERRL, lids and conjunctivae normal ENMT: Mucous membranes are moist. Posterior pharynx clear of any exudate or lesions.Normal dentition.  Neck: normal, supple, no masses, no thyromegaly Respiratory: clear to auscultation bilaterally, no wheezing, no crackles. Normal respiratory effort. No accessory muscle use.  Cardiovascular: Regular rate and rhythm, no murmurs / rubs / gallops. No extremity edema. 2+  pedal pulses. No carotid bruits.  Abdomen: mild tenderness to palpation in Periumbilical and Epigastric Area, no masses palpated. No hepatosplenomegaly. Bowel sounds positive.  Musculoskeletal: no clubbing / cyanosis. No joint deformity upper and lower extremities. Good ROM, no contractures. Normal muscle tone.  Skin: no rashes, lesions, ulcers. No induration Neurologic: CN 2-12 grossly intact. Sensation intact, DTR normal. Strength 5/5 in all 4.  Psychiatric: Normal judgment and insight. Alert and oriented x 3. Normal mood.     Labs on Admission: I have personally reviewed following labs and imaging studies  CBC:  Recent Labs Lab 09/17/16 0449  WBC 12.9*  NEUTROABS 12.0*  HGB 14.9  HCT 42.4  MCV 85.8  PLT 354*   Basic Metabolic Panel:  Recent Labs Lab 09/17/16 0449  NA 135  K 2.9*  CL 102  CO2 21*  GLUCOSE 159*  BUN 15  CREATININE 1.01  CALCIUM 8.8*   GFR: Estimated Creatinine Clearance: 71.9 mL/min (by C-G formula based on SCr of 1.01 mg/dL). Liver Function Tests:  Recent Labs Lab 09/17/16 0449  AST 2,144*  ALT 1,354*  ALKPHOS 99  BILITOT 2.9*  PROT 6.9  ALBUMIN 3.7    Recent Labs Lab 09/17/16 0449  LIPASE 20   No results for input(s): AMMONIA in the last 168 hours. Coagulation Profile:  Recent Labs Lab 09/17/16 0612  INR 1.33   Cardiac Enzymes: No results for input(s): CKTOTAL, CKMB, CKMBINDEX, TROPONINI in the last 168 hours. BNP (last 3 results) No results for input(s): PROBNP in the last 8760 hours. HbA1C: No results for input(s): HGBA1C in the last 72 hours. CBG: No results for input(s): GLUCAP in the last 168 hours. Lipid Profile: No results for input(s): CHOL, HDL, LDLCALC, TRIG, CHOLHDL, LDLDIRECT in the last 72 hours. Thyroid Function Tests: No results for input(s): TSH, T4TOTAL, FREET4, T3FREE, THYROIDAB in the last 72 hours. Anemia Panel: No results for input(s): VITAMINB12, FOLATE, FERRITIN, TIBC, IRON, RETICCTPCT in the last  72 hours. Urine analysis:    Component Value Date/Time   COLORURINE YELLOW 04/08/2016 1035   APPEARANCEUR CLEAR 04/08/2016 1035   LABSPEC >1.046 (H) 04/08/2016 1035   PHURINE 5.5 04/08/2016 1035   GLUCOSEU >=500 (A) 04/08/2016 1035   HGBUR NEGATIVE 04/08/2016 1035   BILIRUBINUR NEGATIVE 04/08/2016 1035   KETONESUR 15 (A) 04/08/2016 1035   PROTEINUR 30 (A) 04/08/2016 1035   NITRITE NEGATIVE 04/08/2016 1035   LEUKOCYTESUR NEGATIVE 04/08/2016 1035   Sepsis Labs: !!!!!!!!!!!!!!!!!!!!!!!!!!!!!!!!!!!!!!!!!!!! @LABRCNTIP (procalcitonin:4,lacticidven:4) )No results found for this or any previous visit (from the past 240 hour(s)).   Radiological Exams on Admission: Ct Abdomen Pelvis W Contrast  Result Date: 09/17/2016 CLINICAL DATA:  Epigastric pain for 2 days.  Leukocytosis. EXAM: CT ABDOMEN AND PELVIS WITH CONTRAST TECHNIQUE: Multidetector CT imaging of the abdomen and pelvis was performed using the  standard protocol following bolus administration of intravenous contrast. CONTRAST:  130mL ISOVUE-300 IOPAMIDOL (ISOVUE-300) INJECTION 61% COMPARISON:  None. FINDINGS: Lower chest: No acute abnormality. Hepatobiliary: Low-attenuation in the left lobe of the liver may represent transient hepatic attenuation changes. Additional scattered low-attenuation foci are indeterminate but more likely benign. Gallbladder and bile ducts are unremarkable. Pancreas: Unremarkable. No pancreatic ductal dilatation or surrounding inflammatory changes. Spleen: Normal in size without focal abnormality. Adrenals/Urinary Tract: Adrenal glands are unremarkable. Kidneys are normal, without renal calculi, focal lesion, or hydronephrosis. Bladder is unremarkable. Stomach/Bowel: Colonic diverticulosis. Acute inflammation of the hepatic flexure is centered on a diverticulum and probably represents acute diverticulitis. Appendix is normal. Stomach and small bowel are unremarkable. Vascular/Lymphatic: The abdominal aorta is normal in  caliber with moderate atherosclerotic calcification. No adenopathy in the abdomen or pelvis. Reproductive: Unremarkable Other: No ascites. Musculoskeletal: No significant skeletal lesion. IMPRESSION: 1. Right hemicolon diverticulitis in the hepatic flexure. 2. Hepatic hypodensities are indeterminate but more likely benign. MRI should be considered for conclusive characterization. 3. Aortic atherosclerosis. Electronically Signed   By: Andreas Newport M.D.   On: 09/17/2016 06:11    EKG: Independently reviewed. Some PVCs  Assessment/Plan Principal Problem:   GI bleed Active Problems:   Abdominal pain   HTN (hypertension)   DM2 (diabetes mellitus, type 2) (HCC)   Diverticulitis   Alcohol abuse   Tobacco use   Abdominal pain with lower GI bleed Right hemicolon diverticulitis -Admit to MedSurg floor as he is hemodynamically stable at this time -Closely monitor vital signs -Recheck CBC and coags -Advance diet as tolerated. IV Cipro and Flagyl -Tylenol as needed -GI consult in in the ED. -He Will need outpatient colonoscopy in about 6 weeks -No transfusion needed at this time. Pain control  Severe Transaminitis with elevated total bilirubin -Alcoholic pattern. Concerns for alcoholic hepatitis. Vs Stone vs hypotension/Shock Liver but the patter suggest alcohol -I will order right upper quadrant ultrasound; acute hepatitis panel. Tylenol level - neg -Hold off on giving prednisone at this time. GI has been consult in.  Hypokalemia -40 meQ potassium ordered in ER. Recheck after and check magnesium along with that as well  Alcohol abuse -Daily drinks 5-6 beers -Place him on CIWA protocol -Counseled to quit using alcohol  Diabetes type 2 -Hold metformin -Accu-Chek and sliding scale at this time  Hypertension -No signs of hemodynamic instability therefore continue lisinopril 20 mg daily  Daily tobacco use -Smokes 5-10 cigarettes daily. Nicotine patch if needed -Counseled to quit  using tobacco   DVT prophylaxis: SCDs due to GI bleed  Code Status: Full Family Communication: Sister at bedside Disposition Plan: To be determined Consults called:  GI consult in in ED Admission status: MedSurg inpatient   Ankit Arsenio Loader MD [facility]   If 7PM-7AM, please contact night-coverage [email or web address] Password [facility]  09/17/2016, 10:39 AM

## 2016-10-20 ENCOUNTER — Encounter (HOSPITAL_COMMUNITY): Payer: Self-pay | Admitting: Internal Medicine

## 2016-10-20 ENCOUNTER — Ambulatory Visit: Payer: Self-pay | Admitting: Cardiology

## 2016-10-27 ENCOUNTER — Telehealth: Payer: Self-pay

## 2016-10-27 ENCOUNTER — Other Ambulatory Visit: Payer: Self-pay | Admitting: Family Medicine

## 2016-10-27 DIAGNOSIS — I10 Essential (primary) hypertension: Secondary | ICD-10-CM

## 2016-10-27 DIAGNOSIS — I639 Cerebral infarction, unspecified: Secondary | ICD-10-CM

## 2016-10-27 MED ORDER — AMLODIPINE BESYLATE 5 MG PO TABS: 5.0000 mg | ORAL_TABLET | Freq: Every day | ORAL | 3 refills | Status: DC

## 2016-10-27 MED FILL — LISINOPRIL 20 MG TAB: 20 | 30 days supply | Qty: 45 | Fill #1

## 2016-10-27 NOTE — Progress Notes (Signed)
Amlodipine e-prescribed to [facility].

## 2016-10-27 NOTE — Telephone Encounter (Signed)
Patient was told to call pharmacy and request refill as they were send with original script

## 2016-10-28 MED FILL — AMLODIPINE BESYLATE 5 MG TA: 5 | 30 days supply | Qty: 30 | Fill #0

## 2016-10-29 ENCOUNTER — Institutional Professional Consult (permissible substitution): Payer: Self-pay | Admitting: Cardiology

## 2016-10-30 ENCOUNTER — Ambulatory Visit (INDEPENDENT_AMBULATORY_CARE_PROVIDER_SITE_OTHER): Payer: Self-pay | Admitting: *Deleted

## 2016-10-30 DIAGNOSIS — I639 Cerebral infarction, unspecified: Secondary | ICD-10-CM

## 2016-10-31 LAB — CUP PACEART INCLINIC DEVICE CHECK
Date Time Interrogation Session: 20180810161759
Implantable Pulse Generator Implant Date: 20180727

## 2016-10-31 NOTE — Progress Notes (Signed)
Wound check in clinic s/p ILR implant. Steri strips removed. Wound well healed without redness or edema. Incision edges approximated. Normal ILR device function. Battery status: GOOD. R-waves 0.80 mV. 0 symptom episodes, 0 tachy episodes, 0 pause episodes, 0 brady episodes. 0 AF episodes (0% burden). Monthly summary reports and ROV with GT PRN. Patient education completed including wound care and remote monitoring.

## 2016-10-31 NOTE — Addendum Note (Signed)
Addended by: Shiela Mayer on: 10/31/2016 04:18 PM   Modules accepted: Level of Service

## 2016-11-03 ENCOUNTER — Encounter: Payer: Self-pay | Admitting: Family Medicine

## 2016-11-03 ENCOUNTER — Ambulatory Visit (INDEPENDENT_AMBULATORY_CARE_PROVIDER_SITE_OTHER): Payer: Self-pay | Admitting: Family Medicine

## 2016-11-03 VITALS — BP 140/82 | HR 87 | Temp 98.1°F | Resp 14 | Ht 64.0 in | Wt 148.0 lb

## 2016-11-03 DIAGNOSIS — R748 Abnormal levels of other serum enzymes: Secondary | ICD-10-CM

## 2016-11-03 DIAGNOSIS — R935 Abnormal findings on diagnostic imaging of other abdominal regions, including retroperitoneum: Secondary | ICD-10-CM

## 2016-11-03 DIAGNOSIS — I10 Essential (primary) hypertension: Secondary | ICD-10-CM

## 2016-11-03 LAB — COMPLETE METABOLIC PANEL WITH GFR
ALT: 11 U/L (ref 9–46)
AST: 10 U/L (ref 10–35)
Albumin: 3.7 g/dL (ref 3.6–5.1)
Alkaline Phosphatase: 126 U/L — ABNORMAL HIGH (ref 40–115)
BUN: 6 mg/dL — AB (ref 7–25)
CHLORIDE: 101 mmol/L (ref 98–110)
CO2: 21 mmol/L (ref 20–32)
CREATININE: 0.85 mg/dL (ref 0.70–1.33)
Calcium: 9.4 mg/dL (ref 8.6–10.3)
GFR, Est African American: >89 mL/min (ref >=60)
GFR, Est Non African American: >89 mL/min (ref >=60)
Glucose, Bld: 106 mg/dL — ABNORMAL HIGH (ref 65–99)
POTASSIUM: 3.7 mmol/L (ref 3.5–5.3)
Sodium: 137 mmol/L (ref 135–146)
TOTAL PROTEIN: 7.2 g/dL (ref 6.1–8.1)
Total Bilirubin: 0.5 mg/dL (ref 0.2–1.2)

## 2016-11-03 NOTE — Progress Notes (Signed)
Patient ID: Caleb Hernandez, male    DOB: 09/23/1960, 56 y.o.   MRN: [id number]  PCP: Scot Jun, FNP  Chief Complaint  Patient presents with  . Follow-up    1 Month    Subjective:  HPI Caleb Hernandez is a 56 y.o. male presents for a one month follow-up post CVA, Diabetes, and Hypertension. Medical problems include: Essential Hypertension, First Degree Heart Block, Type 2 Diabetes, Portal Vein Thrombosis, Liver Mass, and Pancreatic Mass.  [place] reports today that his condition as remained stable since his last office visit. He reports that he has refrained from abusing alcohol and has maintained smoking cessation. Since his last office visit, he had an MRI which reveal protal vein thrombosis, liver, and pancreatic mass. He was referred to gastroenterology for further work-up and evaluation, however declined visit until he was able to complete his application for [facility]. He reports compliance  with taking his prescribed medications. His liver enzymes were elevated with previous labs and will be repeated today. [name] denies jaundice, darkened colored urine, or bruising of skin. Social History   Social History  . Marital status: Single    Spouse name: N/A  . Number of children: N/A  . Years of education: N/A   Occupational History  . Not on file.   Social History Main Topics  . Smoking status: Former Smoker    Quit date: 09/28/2016  . Smokeless tobacco: Never Used  . Alcohol use Yes     Comment: 40 oz per day  . Drug use: No  . Sexual activity: Not on file   Other Topics Concern  . Not on file   Social History Narrative  . No narrative on file    Family History  Problem Relation Age of Onset  . Heart disease Brother   . Diabetes Mother   . Diabetes Father    Review of Systems See HPI  Patient Active Problem List   Diagnosis Date Noted  . Chronic anticoagulation 10/07/2016  . Cerebellar stroke, acute ([place]) 10/05/2016  . Positive  RPR test 09/27/2016  . Alcohol use disorder ([place]) 09/26/2016  . Increased band cell count 09/26/2016  . 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 Abdominal pain 09/17/2016  . GI bleed 09/17/2016  . Essential hypertension 09/17/2016  . Type 2 diabetes mellitus, without long-term current use of insulin ([place]) 09/17/2016  . Diverticulitis 09/17/2016  . Nondependent alcohol abuse 09/17/2016  . Tobacco use 09/17/2016  . Bilateral bunions 12/18/2014  . Onychomycosis of toenail 12/18/2014  . Tinea pedis of both feet 12/18/2014  . Early cataracts, bilateral 01/03/2014  . Male hypogonadism 11/16/2013  . Hypercholesterolemia 04/13/2013  . Proteinuria 04/13/2013  . 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/2013  . Vitamin D deficiency 04/13/2013  . Allergic rhinitis 08/28/2012  . Corns and callosity 08/28/2012  . Tinea pedis 08/28/2012  . Encounter for long-term (current) use of other medications 08/28/2012  . First degree heart block 08/28/2012  . Premature beats 08/28/2012  . Undiagnosed cardiac murmurs 04/20/2012    No Known Allergies  Prior to Admission medications   Medication Sig Start Date End Date Taking? Authorizing Provider  amLODipine (NORVASC) 5 MG tablet Take 1 tablet (5 mg total) by mouth daily. 10/27/16 01/25/17 Yes Scot Jun, FNP  apixaban (ELIQUIS) 5 MG TABS tablet Take 1 tablet (5 mg total) by mouth 2 (two) times daily. 10/05/16  Yes Scot Jun, FNP  folic acid (FOLVITE) 1 MG  tablet Take 1 tablet (1 mg total) by mouth daily. 10/05/16  Yes Scot Jun, FNP  lisinopril (PRINIVIL,ZESTRIL) 20 MG tablet Take 1.5 tablets (30 mg total) by mouth daily. 10/02/16  Yes Scot Jun, FNP  Magnesium 250 MG TABS Take 1 tablet (250 mg total) by mouth daily. 10/05/16 11/04/16 Yes Scot Jun, FNP  metFORMIN (GLUCOPHAGE) 500 MG tablet Take 1 tablet (500 mg total) by mouth 2 (two) times daily with a meal. 10/05/16  Yes Scot Jun, FNP  Omega-3 Fatty Acids (FISH OIL) 600 MG CAPS Take 2 capsules by mouth 2 (two) times daily.   Yes [provider]  pantoprazole (PROTONIX) 40 MG tablet Take 1 tablet (40 mg total) by mouth daily. 10/05/16  Yes Scot Jun, FNP  pravastatin (PRAVACHOL) 20 MG tablet Take 1 tablet (20 mg total) by mouth daily. 10/05/16  Yes Scot Jun, FNP    Past Medical, Surgical Family and Social History reviewed and updated.    Objective:   Today's Vitals   11/03/16 0930  BP: 140/82  Pulse: 87  Resp: 14  Temp: 98.1 F (36.7 C)  TempSrc: Oral  SpO2: 100%  Weight: 148 lb (67.1 kg)  Height: 5\' 4"  (1.626 m)    Wt Readings from Last 3 Encounters:  11/03/16 148 lb (67.1 kg)  10/17/16 150 lb 6.4 oz (68.2 kg)  10/07/16 150 lb 6.4 oz (68.2 kg)   Physical Exam  Constitutional: He is oriented to person, place, and time. He appears well-developed and well-nourished.  HENT:  Head: Normocephalic and atraumatic.  Eyes: No scleral icterus.  Neck: Normal range of motion. Neck supple.  Cardiovascular: Normal rate, regular rhythm, normal heart sounds and intact distal pulses.   Pulmonary/Chest: Effort normal and breath sounds normal.  Abdominal: Soft. Bowel sounds are normal. There is no tenderness. There is no rebound and no guarding.  Musculoskeletal: Normal range of motion.  Neurological: He is alert and oriented to person, place, and time.  Skin: Skin is warm and dry.  Psychiatric: He has a normal mood and affect. His behavior is normal. Judgment and thought content normal.   Assessment & Plan:  1. Essential hypertension, stable  -Continue current regimen.  2. Abnormal liver enzymes - Magnesium - COMPLETE METABOLIC PANEL WITH GFR  3. Abnormal CT of the abdomen  -Complete [facility] Financial Assistance Application and reschedule office visit with gastroenterology.  RTC: 3 months for routine follow-up, CMP, Hemoglobin A1c, medication management  Carroll Sage. Kenton Kingfisher, MSN, FNP-C [facility]  [address] [place], [place] [address] [phone]

## 2016-11-04 LAB — MAGNESIUM: MAGNESIUM: 1.8 mg/dL (ref 1.5–2.5)

## 2016-11-07 ENCOUNTER — Other Ambulatory Visit: Payer: Self-pay | Admitting: *Deleted

## 2016-11-07 MED ORDER — APIXABAN 5 MG PO TABS: 5.0000 mg | ORAL_TABLET | Freq: Two times a day (BID) | ORAL | 3 refills | Status: DC

## 2016-11-07 NOTE — Telephone Encounter (Signed)
PRINTED FOR PASS PROGRAM 

## 2016-11-11 ENCOUNTER — Encounter: Payer: Self-pay | Admitting: Physician Assistant

## 2016-11-11 ENCOUNTER — Telehealth: Payer: Self-pay | Admitting: Family Medicine

## 2016-11-11 ENCOUNTER — Telehealth: Payer: Self-pay | Admitting: *Deleted

## 2016-11-11 ENCOUNTER — Telehealth: Payer: Self-pay | Admitting: Internal Medicine

## 2016-11-11 NOTE — Telephone Encounter (Signed)
Patient called to confirm that he now has the [facility] discount. Provided him the number to [facility] to schedule his referral that he had originally postponed.  Carroll Sage. Kenton Kingfisher, MSN, FNP-C [facility]  [address] [place], [place] [address] [phone]

## 2016-11-11 NOTE — Telephone Encounter (Signed)
Referral in epic for patient that has liver mass and pancreatic mass. Gi hx 6 years ago in [place] per pt. Please advise scheduling.

## 2016-11-11 NOTE — Telephone Encounter (Signed)
Received request for Medical records from [place], forwarded to [place] for 08/21

## 2016-11-11 NOTE — Telephone Encounter (Signed)
Patient can have next APP appt, anyday

## 2016-11-17 ENCOUNTER — Ambulatory Visit (INDEPENDENT_AMBULATORY_CARE_PROVIDER_SITE_OTHER): Payer: Self-pay | Admitting: *Deleted

## 2016-11-17 DIAGNOSIS — I639 Cerebral infarction, unspecified: Secondary | ICD-10-CM

## 2016-11-17 DIAGNOSIS — Z95818 Presence of other cardiac implants and grafts: Secondary | ICD-10-CM | POA: Insufficient documentation

## 2016-11-17 NOTE — Progress Notes (Signed)
Cardiology Office Note:    Date:  11/18/2016   ID:  Caleb Hernandez, DOB 12-02-1960, MRN [id number]  PCP:  Scot Jun, FNP  Cardiologist:  Shirlee More, MD    Referring MD: Scot Jun, FNP    ASSESSMENT:    1. Cerebrovascular accident (CVA), unspecified mechanism ([place])   2. Status post placement of implantable loop recorder   3. Essential hypertension   4. Dyslipidemia   5. Cerebellar stroke, acute ([name])    PLAN:    In order of problems listed above:  1. Stable, continue current DOAC 2. Stable, continue to monitor 3. Worsened, low BP will reduce his CCB to every other day 4. Stable, continue his stain with CVA 5. Concern for embolic source, he has a loop recorder and is on apixaban   Next appointment: 6 months   Medication Adjustments/Labs and Tests Ordered: Current medicines are reviewed at length with the patient today.  Concerns regarding medicines are outlined above.  No orders of the defined types were placed in this encounter.  Meds ordered this encounter  Medications  . amLODipine (NORVASC) 5 MG tablet    Sig: Take 1 tablet (5 mg total) by mouth every other day.    Dispense:  180 tablet    Refill:  3    Chief Complaint  Patient presents with  . Follow-up    6 week flup appt  . Atrial Fibrillation    History of Present Illness:    Caleb Hernandez is a 56 y.o. male with a hx of Hypertension, hyperlipidemia, DM and cerebellar stroke with ILR  last seen one month ago. Compliance with diet, lifestyle and medications: Yes He feels improved, no HA dizziness, palpitation, syncope or TIA. No bleeding noted. His BP is low and HR is rapid today. Past Medical History:  Diagnosis Date  . Abdominal pain 09/17/2016  . Alcohol use disorder ([place]) 09/26/2016  . Allergic rhinitis 08/28/2012  . Bilateral bunions 12/18/2014  . Cerebellar stroke, acute ([place]) 10/05/2016  . 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 Corns  and callosity 08/28/2012  . Diabetes mellitus without complication ([place])   . Diverticulitis 09/17/2016  . Early cataracts, bilateral 01/03/2014  . Encounter for long-term (current) use of other medications 08/28/2012  . Essential hypertension 09/17/2016  . First degree heart block 08/28/2012  . GI bleed 09/17/2016  . High cholesterol   . Hypercholesterolemia 04/13/2013  . Hypertension   . Increased band cell count 09/26/2016  . Male hypogonadism 11/16/2013  . Nondependent alcohol abuse 09/17/2016  . Onychomycosis of toenail 12/18/2014  . Positive RPR test 09/27/2016  . Premature beats 08/28/2012  . Proteinuria 04/13/2013  . 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/2013  . Tinea pedis 08/28/2012  . Tinea pedis of both feet 12/18/2014  . Tobacco use 09/17/2016  . Type 2 diabetes mellitus, without long-term current use of insulin ([place]) 09/17/2016  . Undiagnosed cardiac murmurs 04/20/2012  . Vitamin D deficiency 04/13/2013    Past Surgical History:  Procedure Laterality Date  . BACK SURGERY    . LOOP RECORDER INSERTION N/A 10/17/2016   Procedure: Loop Recorder Insertion;  Surgeon: Evans Lance, MD;  Location: [facility];  Service: Cardiovascular;  Laterality: N/A;    Current Medications: Current Meds  Medication Sig  . amLODipine (NORVASC) 5 MG tablet Take 1 tablet (5 mg total) by mouth every other day.  Marland Kitchen apixaban (ELIQUIS) 5 MG TABS tablet Take 1 tablet (5 mg total)  by mouth 2 (two) times daily.  . folic acid (FOLVITE) 1 MG tablet Take 1 tablet (1 mg total) by mouth daily.  Marland Kitchen lisinopril (PRINIVIL,ZESTRIL) 20 MG tablet Take 1.5 tablets (30 mg total) by mouth daily.  . Magnesium 250 MG TABS Take 1 tablet by mouth daily.  . metFORMIN (GLUCOPHAGE) 500 MG tablet Take 1 tablet (500 mg total) by mouth 2 (two) times daily with a meal.  . Omega-3 Fatty Acids (FISH OIL) 600 MG CAPS Take 2 capsules by mouth 2 (two) times daily.  . pantoprazole (PROTONIX) 40 MG tablet Take 1 tablet (40 mg total) by mouth daily.  .  pravastatin (PRAVACHOL) 20 MG tablet Take 1 tablet (20 mg total) by mouth daily.  . Thiamine HCl (VITAMIN B-1) 250 MG tablet Take 250 mg by mouth daily.  . [DISCONTINUED] amLODipine (NORVASC) 5 MG tablet Take 1 tablet (5 mg total) by mouth daily.     Allergies:   Patient has no known allergies.   Social History   Social History  . Marital status: Single    Spouse name: N/A  . Number of children: N/A  . Years of education: N/A   Social History Main Topics  . Smoking status: Former Smoker    Quit date: 09/28/2016  . Smokeless tobacco: Never Used  . Alcohol use Yes     Comment: 40 oz per day  . Drug use: No  . Sexual activity: Not Asked   Other Topics Concern  . None   Social History Narrative  . None     Family History: The patient's family history includes Diabetes in his father and mother; Heart disease in his brother. ROS:   Please see the history of present illness.    All other systems reviewed and are negative.  EKGs/Labs/Other Studies Reviewed:    The following studies were reviewed today: KG today, sinus tachycardia 108 BPM  ILR 10/31/16: Normal ILR device function. Battery status: GOOD. R-waves 0.80 mV. 0 symptom episodes, 0 tachy episodes, 0 pause  episodes, 0 brady episodes. 0 AF episodes (0% burden).  Recent Labs: 10/02/2016: Hemoglobin 13.9; Platelets 476 11/03/2016: ALT 11; BUN 6; Creat 0.85; Magnesium 1.8; Potassium 3.7; Sodium 137  Recent Lipid Panel No results found for: CHOL, TRIG, HDL, CHOLHDL, VLDL, LDLCALC, LDLDIRECT  Physical Exam:    VS:  BP 104/60 (BP Location: Right Arm, Patient Position: Sitting)   Pulse (!) 112   Ht 5\' 4"  (1.626 m)   Wt 139 lb 6.4 oz (63.2 kg)   SpO2 98%   BMI 23.93 kg/m     Wt Readings from Last 3 Encounters:  11/18/16 139 lb 6.4 oz (63.2 kg)  11/03/16 148 lb (67.1 kg)  10/17/16 150 lb 6.4 oz (68.2 kg)     GEN:  Well nourished, well developed in no acute distress HEENT: Normal NECK: No JVD; No carotid  bruits LYMPHATICS: No lymphadenopathy CARDIAC: RRR, no murmurs, rubs, gallops rapid 111BPM RESPIRATORY:  Clear to auscultation without rales, wheezing or rhonchi  ABDOMEN: Soft, non-tender, non-distended MUSCULOSKELETAL:  No edema; No deformity  SKIN: Warm and dry NEUROLOGIC:  Alert and oriented x 3 PSYCHIATRIC:  Normal affect    Signed, Shirlee More, MD  11/18/2016 2:26 PM    [place]

## 2016-11-17 NOTE — Progress Notes (Signed)
Carelink Summary Report / Loop Recorder 

## 2016-11-18 ENCOUNTER — Ambulatory Visit (INDEPENDENT_AMBULATORY_CARE_PROVIDER_SITE_OTHER): Payer: Self-pay | Admitting: Cardiology

## 2016-11-18 ENCOUNTER — Encounter: Payer: Self-pay | Admitting: Cardiology

## 2016-11-18 VITALS — BP 104/60 | HR 112 | Ht 64.0 in | Wt 139.4 lb

## 2016-11-18 DIAGNOSIS — I639 Cerebral infarction, unspecified: Secondary | ICD-10-CM

## 2016-11-18 DIAGNOSIS — E785 Hyperlipidemia, unspecified: Secondary | ICD-10-CM

## 2016-11-18 DIAGNOSIS — I10 Essential (primary) hypertension: Secondary | ICD-10-CM

## 2016-11-18 DIAGNOSIS — Z95818 Presence of other cardiac implants and grafts: Secondary | ICD-10-CM

## 2016-11-18 MED ORDER — AMLODIPINE BESYLATE 5 MG PO TABS: 5.0000 mg | ORAL_TABLET | ORAL | 3 refills | Status: DC

## 2016-11-18 NOTE — Patient Instructions (Signed)
Medication Instructions:  Your physician has recommended you make the following change in your medication:  DECREASE amlodipine to every other day   Labwork: None  Testing/Procedures: You had an EKG today.  Follow-Up: Your physician wants you to follow-up in: 6 months. You will receive a reminder letter in the mail two months in advance. If you don't receive a letter, please call our office to schedule the follow-up appointment.   Any Other Special Instructions Will Be Listed Below (If Applicable).     If you need a refill on your cardiac medications before your next appointment, please call your pharmacy.

## 2016-11-20 DIAGNOSIS — R748 Abnormal levels of other serum enzymes: Secondary | ICD-10-CM | POA: Insufficient documentation

## 2016-11-20 DIAGNOSIS — E871 Hypo-osmolality and hyponatremia: Secondary | ICD-10-CM | POA: Insufficient documentation

## 2016-11-20 DIAGNOSIS — E876 Hypokalemia: Secondary | ICD-10-CM | POA: Insufficient documentation

## 2016-11-20 MED FILL — FOLIC ACID 1 MG TABLET: 1 | 30 days supply | Qty: 30 | Fill #1

## 2016-11-20 MED FILL — ?PANTOPRAZOLE SOD DR 40MG: 40 MG | 30 days supply | Qty: 30 | Fill #1

## 2016-11-20 MED FILL — PRAVASTATIN NA 20 MG TAB: 20 | 30 days supply | Qty: 30 | Fill #1

## 2016-11-20 MED FILL — AMLODIPINE BESYLATE 5 MG TA: 5 | 30 days supply | Qty: 30 | Fill #1

## 2016-11-21 DIAGNOSIS — K750 Abscess of liver: Secondary | ICD-10-CM | POA: Insufficient documentation

## 2016-11-21 LAB — CUP PACEART REMOTE DEVICE CHECK
MDC IDC PG IMPLANT DT: 20180727
MDC IDC SESS DTM: 20180826174307

## 2016-11-22 DIAGNOSIS — R1012 Left upper quadrant pain: Secondary | ICD-10-CM | POA: Insufficient documentation

## 2016-11-25 ENCOUNTER — Ambulatory Visit: Payer: Self-pay | Admitting: Physician Assistant

## 2016-11-28 MED FILL — ?METFORMIN HCL 500MG TABLET: 500 | 30 days supply | Qty: 60 | Fill #1

## 2016-11-28 MED FILL — LISINOPRIL 20 MG TAB: 20 | 30 days supply | Qty: 45 | Fill #2

## 2016-12-01 MED FILL — !ELIQUIS 5 MG TABLET: 5 | 30 days supply | Qty: 60 | Fill #1

## 2016-12-16 ENCOUNTER — Ambulatory Visit (INDEPENDENT_AMBULATORY_CARE_PROVIDER_SITE_OTHER): Payer: Self-pay | Admitting: *Deleted

## 2016-12-16 DIAGNOSIS — I639 Cerebral infarction, unspecified: Secondary | ICD-10-CM

## 2016-12-16 DIAGNOSIS — K029 Dental caries, unspecified: Secondary | ICD-10-CM | POA: Insufficient documentation

## 2016-12-17 NOTE — Progress Notes (Signed)
Carelink Summary Report / Loop Recorder 

## 2016-12-19 LAB — CUP PACEART REMOTE DEVICE CHECK
Implantable Pulse Generator Implant Date: 20180727
MDC IDC SESS DTM: 20180925183911

## 2016-12-22 ENCOUNTER — Ambulatory Visit (INDEPENDENT_AMBULATORY_CARE_PROVIDER_SITE_OTHER): Payer: Self-pay | Admitting: Physician Assistant

## 2016-12-22 ENCOUNTER — Encounter: Payer: Self-pay | Admitting: Physician Assistant

## 2016-12-22 ENCOUNTER — Encounter (INDEPENDENT_AMBULATORY_CARE_PROVIDER_SITE_OTHER): Payer: Self-pay

## 2016-12-22 VITALS — BP 118/74 | HR 89 | Ht 64.0 in | Wt 149.1 lb

## 2016-12-22 DIAGNOSIS — Z1211 Encounter for screening for malignant neoplasm of colon: Secondary | ICD-10-CM

## 2016-12-22 DIAGNOSIS — Z1212 Encounter for screening for malignant neoplasm of rectum: Secondary | ICD-10-CM

## 2016-12-22 DIAGNOSIS — K750 Abscess of liver: Secondary | ICD-10-CM

## 2016-12-22 DIAGNOSIS — Z8673 Personal history of transient ischemic attack (TIA), and cerebral infarction without residual deficits: Secondary | ICD-10-CM

## 2016-12-22 DIAGNOSIS — Z7901 Long term (current) use of anticoagulants: Secondary | ICD-10-CM

## 2016-12-22 DIAGNOSIS — K862 Cyst of pancreas: Secondary | ICD-10-CM

## 2016-12-22 NOTE — Progress Notes (Addendum)
Chief Complaint: Consultation for screening colonoscopy  HPI:  Caleb Hernandez is a 56 year old African-American male with a past medical history as listed below including recent diagnosis of liver abscess, pancreatic cystic lesion, recent stroke started on Eliquis and diverticulitis, who was referred to me by Scot Jun, FNP for consultation of a screening colonoscopy.     Per review of chart it appears patient was in the hospital 09/20/16 with acute diverticulitis. He was followed by Dr. Lenard Simmer at that time and it was suggested that he have a follow-up colonoscopy in the next 4-6 weeks after resolution of his diverticulitis. Patient was then seen in [facility] 09/26/16 for cryptogenic stroke. Patient was started on Eliquis. Patient was admitted to the hospital 11/20/16 and discharged 11/27/16 for finding of a pyogenic liver abscess. CT of the chest and abdomen showed multiseptated thick-walled abscess in the left hepatic lobe at site of previous hepatic infarct with interval increase in size. This was thought likely related to a diverticular abscess given history of prior diverticulitis at the hepatic flexure. Patient had an abscess drained. He also had imaging which showed a small pancreatic cyst. Recommendations are for repeat imaging in a year.   Today, the patient explains that he was told that he needed a screening colonoscopy and that is why he is here. He just had his liver abscess drained 12/16/16 and is continued on 4 weeks of Levofloxacin and Flagyl by the IR department. Patient does describe that he had a colonoscopy 4-5 years ago in [facility]. He is still maintained on a Eliquis after a stroke about 4-5 months ago.     Patient denies a change in bowel habits, abdominal pain, heartburn, reflux or other GI symptoms today.  Past Medical History:  Diagnosis Date  . Abdominal pain 09/17/2016  . Alcohol use disorder 09/26/2016  . Allergic rhinitis 08/28/2012  . Bilateral bunions 12/18/2014  .  Cerebellar stroke, acute ([place]) 10/05/2016  . 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 Corns and callosity 08/28/2012  . Diabetes mellitus without complication ([place])   . Diverticulitis 09/17/2016  . Early cataracts, bilateral 01/03/2014  . Encounter for long-term (current) use of other medications 08/28/2012  . Essential hypertension 09/17/2016  . First degree heart block 08/28/2012  . GI bleed 09/17/2016  . High cholesterol   . Hypercholesterolemia 04/13/2013  . Hypertension   . Increased band cell count 09/26/2016  . Male hypogonadism 11/16/2013  . Nondependent alcohol abuse 09/17/2016  . Onychomycosis of toenail 12/18/2014  . Positive RPR test 09/27/2016  . Premature beats 08/28/2012  . Proteinuria 04/13/2013  . 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/2013  . Tinea pedis 08/28/2012  . Tinea pedis of both feet 12/18/2014  . Tobacco use 09/17/2016  . Type 2 diabetes mellitus, without long-term current use of insulin ([place]) 09/17/2016  . Undiagnosed cardiac murmurs 04/20/2012  . Vitamin D deficiency 04/13/2013    Past Surgical History:  Procedure Laterality Date  . BACK SURGERY    . LOOP RECORDER INSERTION N/A 10/17/2016   Procedure: Loop Recorder Insertion;  Surgeon: Evans Lance, MD;  Location: [facility];  Service: Cardiovascular;  Laterality: N/A;    Current Outpatient Prescriptions  Medication Sig Dispense Refill  . amLODipine (NORVASC) 5 MG tablet Take 1 tablet (5 mg total) by mouth every other day. 180 tablet 3  . apixaban (ELIQUIS) 5 MG TABS tablet Take 1 tablet (5 mg total) by mouth 2 (two) times daily. 180 tablet  3  . folic acid (FOLVITE) 1 MG tablet Take 1 tablet (1 mg total) by mouth daily. 90 tablet 1  . lisinopril (PRINIVIL,ZESTRIL) 20 MG tablet Take 1.5 tablets (30 mg total) by mouth daily. 60 tablet 2  . Magnesium 250 MG TABS Take 1 tablet by mouth daily.    . metFORMIN (GLUCOPHAGE) 500 MG tablet Take 1 tablet (500 mg total) by mouth 2 (two) times  daily with a meal. 180 tablet 1  . Omega-3 Fatty Acids (FISH OIL) 600 MG CAPS Take 2 capsules by mouth 2 (two) times daily.    . pantoprazole (PROTONIX) 40 MG tablet Take 1 tablet (40 mg total) by mouth daily. 90 tablet 1  . pravastatin (PRAVACHOL) 20 MG tablet Take 1 tablet (20 mg total) by mouth daily. 90 tablet 1  . Thiamine HCl (VITAMIN B-1) 250 MG tablet Take 250 mg by mouth daily.     No current facility-administered medications for this visit.     Allergies as of 12/22/2016  . (No Known Allergies)    Family History  Problem Relation Age of Onset  . Heart disease Brother   . Diabetes Mother   . Hypertension Mother   . Diabetes Father   . Hypercalcemia Father   . Stomach cancer Brother   . AAA (abdominal aortic aneurysm) Brother   . Colon cancer Neg Hx     Social History   Social History  . Marital status: Single    Spouse name: N/A  . Number of children: N/A  . Years of education: N/A   Occupational History  . Not on file.   Social History Main Topics  . Smoking status: Former Smoker    Quit date: 09/28/2016  . Smokeless tobacco: Never Used  . Alcohol use Yes     Comment: 40 oz per day/ former  . Drug use: No  . Sexual activity: Yes    Partners: Female   Other Topics Concern  . Not on file   Social History Narrative  . No narrative on file    Review of Systems:    Constitutional: No fever or chills Skin: No rash   Cardiovascular: No chest pain Respiratory: No SOB Gastrointestinal: See HPI and otherwise negative Genitourinary: No dysuria  Neurological: No headache Musculoskeletal: No new muscle or joint pain Hematologic: No bleeding  Psychiatric: No history of depression or anxiety   Physical Exam:  Vital signs: BP 118/74   Pulse 89   Ht 5\' 4"  (1.626 m)   Wt 149 lb 2 oz (67.6 kg)   BMI 25.60 kg/m   Constitutional:   Pleasant AA male appears to be in NAD, Well developed, Well nourished, alert and cooperative Head:  Normocephalic and  atraumatic. Eyes:   PEERL, EOMI. No icterus. Conjunctiva pink. Ears:  Normal auditory acuity. Neck:  Supple Throat: Oral cavity and pharynx without inflammation, swelling or lesion.  Respiratory: Respirations even and unlabored. Lungs clear to auscultation bilaterally.   No wheezes, crackles, or rhonchi.  Cardiovascular: Normal S1, S2. No MRG. Regular rate and rhythm. No peripheral edema, cyanosis or pallor.  Gastrointestinal:  Soft, nondistended, nontender. No rebound or guarding. Normal bowel sounds. No appreciable masses or hepatomegaly. Rectal:  Not performed.  Msk:  Symmetrical without gross deformities. Without edema, no deformity or joint abnormality.  Neurologic:  Alert and  oriented x4;  grossly normal neurologically.  Skin:   Dry and intact without significant lesions or rashes. Psychiatric: Demonstrates good judgement and reason without abnormal affect  or behaviors.  MOST RECENT LABS AND IMAGING: CBC    Component Value Date/Time   WBC 9.0 10/02/2016 0907   RBC 4.81 10/02/2016 0907   HGB 13.9 10/02/2016 0907   HCT 41.8 10/02/2016 0907   PLT 476 (H) 10/02/2016 0907   MCV 86.9 10/02/2016 0907   MCH 28.9 10/02/2016 0907   MCHC 33.3 10/02/2016 0907   RDW 13.6 10/02/2016 0907   LYMPHSABS 3,060 10/02/2016 0907   MONOABS 360 10/02/2016 0907   EOSABS 90 10/02/2016 0907   BASOSABS 0 10/02/2016 0907    CMP     Component Value Date/Time   NA 137 11/03/2016 1036   K 3.7 11/03/2016 1036   CL 101 11/03/2016 1036   CO2 21 11/03/2016 1036   GLUCOSE 106 (H) 11/03/2016 1036   BUN 6 (L) 11/03/2016 1036   CREATININE 0.85 11/03/2016 1036   CALCIUM 9.4 11/03/2016 1036   PROT 7.2 11/03/2016 1036   ALBUMIN 3.7 11/03/2016 1036   AST 10 11/03/2016 1036   ALT 11 11/03/2016 1036   ALKPHOS 126 (H) 11/03/2016 1036   BILITOT 0.5 11/03/2016 1036   GFRNONAA >89 11/03/2016 1036   GFRAA >89 11/03/2016 1036   CLINICAL DATA:History of hepatic abscess, post percutaneous drainage catheter  placement on 11/21/2016.  Patient with history of diverticulosis and potential septic thrombophlebitis within the SMV as was demonstrated on CT scan the abdomen pelvis performed 11/17/2016.  Picture reports little to no output from the percutaneous drainage catheter for the past several days. He denies fever or chills.  EXAM: CT ABDOMEN WITH CONTRAST 12/08/16  TECHNIQUE: Multidetector CT imaging of the abdomen was performed using the standard protocol following bolus administration of intravenous contrast.  CONTRAST:100 Omnipaque 350  COMPARISON:CT abdomen and pelvis - 11/25/2016; 11/20/2016; 09/17/2016; CT-guided percutaneous drainage catheter placement - 11/21/2016  FINDINGS: Lower chest: Limited visualization of the lower thorax is negative for focal airspace opacity or pleural effusion.  Normal heart size.No pericardial effusion.  Hepatobiliary: Normal hepatic contour.  Unchanged positioning of percutaneous drainage catheter with end coiled and locked within the subcapsular aspect of the lateral segment of the left lobe of the liver. The hepatic parenchyma surrounding the percutaneous drainage catheter demonstrates a minimal amount of expected persistent asymmetric hypoattenuation, however there is no residual peripheral enhancement to suggest residual abscess. No new definable/drainable hepatic abscess.  Re- demonstrated geographic segmental perfusion abnormality within the anterior segment of the right lobe of the liver (image 10, series 2), slightly less conspicuous compared to the 11/20/2016 examination, likely secondary to phase of enhancement. No discrete hepatic lesions. Note is made of a phrygian cap. Otherwise normal appearance of the gallbladder given degree distention. No radiopaque gallstones. No ascites.  Pancreas: Known 3 mm cystic lesion within the head of the pancreas is not well demonstrated on the present examination.  Spleen: Normal  appearance of the spleen  Adrenals/Urinary Tract: There is symmetric enhancement and excretion of the bilateral kidneys. No definite renal stones on this postcontrast examination. No urine obstruction.  There is unchanged thickening of the bilateral adrenal glands without discrete definable nodule.  Stomach/Bowel: Scattered colonic diverticulosis without evidence of diverticulitis. No evidence of enteric obstruction. No pneumoperitoneum, pneumatosis or portal venous gas.  Vascular/Lymphatic: Minimal amount of atherosclerotic plaque with a normal caliber abdominal aorta. The branch vessels of the abdominal aorta appear patent throughout their imaged course.  Re- demonstrated potential tiny amount of nonocclusive thrombus within the SMV (image 27, series 2).  Scattered porta hepatis and retroperitoneal lymph  nodes are numerous though individually not enlarged by size criteria with index porta hepatis lymph node measuring 1 cm in greatest short axis diameter (19, series 2, unchanged and presumably reactive etiology. No bulky retroperitoneal, porta hepatis or mesenteric adenopathy.  Other: Regional soft tissues appear normal.  Musculoskeletal: No acute or aggressive osseous abnormalities.  IMPRESSION: 1. Complete resolution of hepatic abscess following percutaneous drainage catheter placement. No new definable/drainable fluid collections. 2. Re- demonstrated tiny amount of nonocclusive thrombus versus within the SMV. 3. Colonic diverticulosis without evidence of diverticulitis. 4. Aortic Atherosclerosis (ICD10-I70.0). 5. **An incidental finding of potential clinical significance has been found. Known 3 mm cystic lesion within the head of the pancreas is not well demonstrated on the present examination though was seen on abdominal MRI performed 10/10/2016, and resolution should not be assumed on the basis this examination. As such, as was previously recommended, a follow-up MRI  abdomen without and with IV contrast recommended in 1 year. This recommendation follows ACR consensus guidelines: Management of Incidental Pancreatic Cysts: A White Paper of the ACR Incidental Findings Committee. [place] 8466;59:935-701.  PLAN: Given complete resolution of hepatic abscess and lack of output from the percutaneous drainage catheter, the percutaneous drainage catheter was removed intact.  Patient was encouraged to complete his prescribed course of antibiotics and keep all follow-up appointments.   Electronically Signed By: Joaquim Lai M.D. On: 12/08/2016 08:36  Assessment: 1. Screening for colorectal cancer: last colo 4-5 yrs ago in [place] 2. History of recent liver abscess status post drainage 12/16/16 3. History of recent CVA 09/26/16 4. Chronic anticoagulation for above with Eliquis 5. Pancreatic cyst: Small, recommend repeat imaging in 1 year  Plan: 1. Discussed case with Dr. Carlean Purl. Patient continues and antibiotics for his liver abscess. He has also had recent CVA within the past 4 months and has been placed on Eliquis, last colonoscopy 4-5 years ago per patient at [place]. 2. Patient will need repeat imaging for pancreatic cyst in 1 year 3. We will postpone patient's screening colonoscopy at this time as he has not described any GI symptoms and would be at increased risk for this procedure due to multiple other comorbidities at this time. 4. Will request records from last colonoscopy 4-5 yrs ago per pt in [place] 4. Placed patient in a recall for an office visit appointment with Dr. Carlean Purl in 6 months to rediscuss colonoscopy  Ellouise Newer, PA-C [facility] 12/22/2016, 10:15 AM  Cc: Scot Jun, FNP   Agree with Ms. Mort Sawyers evaluation and management.  Gatha Mayer, MD, [facility]

## 2016-12-29 MED FILL — FOLIC ACID 1 MG TABLET: 1 | 30 days supply | Qty: 30 | Fill #2

## 2016-12-31 MED FILL — PRAVASTATIN NA 20 MG TAB: 20 | 30 days supply | Qty: 30 | Fill #2

## 2016-12-31 MED FILL — AMLODIPINE BESYLATE 5 MG TA: 5 | 30 days supply | Qty: 30 | Fill #2

## 2016-12-31 MED FILL — LISINOPRIL 20 MG TAB: 20 | 30 days supply | Qty: 45 | Fill #3

## 2016-12-31 MED FILL — !ELIQUIS 5 MG TABLET: 5 | 30 days supply | Qty: 60 | Fill #2

## 2016-12-31 MED FILL — ?METFORMIN HCL 500MG TABLET: 500 | 30 days supply | Qty: 60 | Fill #2

## 2016-12-31 MED FILL — ?PANTOPRAZOLE SOD DR 40MG: 40 MG | 30 days supply | Qty: 30 | Fill #2

## 2017-01-09 ENCOUNTER — Telehealth: Payer: Self-pay | Admitting: *Deleted

## 2017-01-09 NOTE — Telephone Encounter (Signed)
Received request for Medical records from [place], forwarded to [place] for email/scan/SLS 10/19

## 2017-01-15 ENCOUNTER — Ambulatory Visit (INDEPENDENT_AMBULATORY_CARE_PROVIDER_SITE_OTHER): Payer: Self-pay | Admitting: *Deleted

## 2017-01-15 DIAGNOSIS — I639 Cerebral infarction, unspecified: Secondary | ICD-10-CM

## 2017-01-16 NOTE — Progress Notes (Signed)
Carelink Summary Report / Loop Recorder 

## 2017-01-20 LAB — CUP PACEART REMOTE DEVICE CHECK
Date Time Interrogation Session: 20181025193626
Implantable Pulse Generator Implant Date: 20180727

## 2017-01-26 ENCOUNTER — Telehealth: Payer: Self-pay | Admitting: *Deleted

## 2017-01-26 NOTE — Telephone Encounter (Signed)
Received request for Medical records from [place], forwarded to [place] for email/scan/SLS 11/05

## 2017-01-29 ENCOUNTER — Other Ambulatory Visit: Payer: Self-pay | Admitting: Family Medicine

## 2017-01-29 MED FILL — ?PANTOPRAZOLE SOD DR 40MG: 40 MG | 30 days supply | Qty: 30 | Fill #3

## 2017-01-29 MED FILL — LISINOPRIL 20 MG TAB: 20 | 30 days supply | Qty: 45 | Fill #0

## 2017-01-29 MED FILL — AMLODIPINE BESYLATE 5 MG TA: 5 | 30 days supply | Qty: 30 | Fill #3

## 2017-01-29 MED FILL — FOLIC ACID 1 MG TABLET: 1 | 30 days supply | Qty: 30 | Fill #3

## 2017-01-29 MED FILL — PRAVASTATIN NA 20 MG TAB: 20 | 30 days supply | Qty: 30 | Fill #3

## 2017-02-03 ENCOUNTER — Ambulatory Visit (INDEPENDENT_AMBULATORY_CARE_PROVIDER_SITE_OTHER): Payer: Self-pay | Admitting: Family Medicine

## 2017-02-03 ENCOUNTER — Encounter: Payer: Self-pay | Admitting: Family Medicine

## 2017-02-03 VITALS — BP 140/92 | HR 87 | Temp 98.7°F | Ht 64.0 in | Wt 161.0 lb

## 2017-02-03 DIAGNOSIS — I10 Essential (primary) hypertension: Secondary | ICD-10-CM

## 2017-02-03 DIAGNOSIS — E118 Type 2 diabetes mellitus with unspecified complications: Secondary | ICD-10-CM

## 2017-02-03 LAB — MAGNESIUM: Magnesium: 1.6 mg/dL (ref 1.5–2.5)

## 2017-02-03 LAB — COMPLETE METABOLIC PANEL WITH GFR
AG Ratio: 1.5 (calc) (ref 1.0–2.5)
ALKALINE PHOSPHATASE (APISO): 93 U/L (ref 40–115)
ALT: 26 U/L (ref 9–46)
AST: 17 U/L (ref 10–35)
Albumin: 4.3 g/dL (ref 3.6–5.1)
BILIRUBIN TOTAL: 0.5 mg/dL (ref 0.2–1.2)
BUN: 13 mg/dL (ref 7–25)
CO2: 23 mmol/L (ref 20–32)
CREATININE: 0.76 mg/dL (ref 0.70–1.33)
Calcium: 9.7 mg/dL (ref 8.6–10.3)
Chloride: 94 mmol/L — ABNORMAL LOW (ref 98–110)
GFR, Est African American: 119 mL/min/{1.73_m2} (ref >=60)
GFR, Est Non African American: 103 mL/min/{1.73_m2} (ref >=60)
GLOBULIN: 2.9 g/dL (ref 1.9–3.7)
GLUCOSE: 500 mg/dL — AB (ref 65–99)
Potassium: 4 mmol/L (ref 3.5–5.3)
Sodium: 130 mmol/L — ABNORMAL LOW (ref 135–146)
Total Protein: 7.2 g/dL (ref 6.1–8.1)

## 2017-02-03 LAB — POCT GLYCOSYLATED HEMOGLOBIN (HGB A1C)

## 2017-02-03 MED ORDER — METFORMIN HCL 500 MG PO TABS: 1000.0000 mg | ORAL_TABLET | Freq: Two times a day (BID) | ORAL | 3 refills | Status: DC

## 2017-02-03 MED ORDER — SITAGLIPTIN PHOSPHATE 25 MG PO TABS: 25.0000 mg | ORAL_TABLET | Freq: Every day | ORAL | 1 refills | Status: DC

## 2017-02-03 MED FILL — ?METFORMIN HCL 500MG TABLET: 500 | 30 days supply | Qty: 120 | Fill #0

## 2017-02-03 MED FILL — JANUVIA 25 MG TABLET: 25 | 30 days supply | Qty: 30 | Fill #0

## 2017-02-03 NOTE — Progress Notes (Signed)
Patient ID: Caleb Hernandez, male    DOB: [birth year], 56 y.o.   MRN: [id number]  PCP: Scot Jun, FNP  Chief Complaint  Patient presents with  . Follow-up    3 MONTH    Subjective:  HPI Caleb Hernandez is a 56 y.o. male presents for routine 3 month follow-up of chronic condition. Medical problems significant for hypertension, type 2 diabetes, CVA, chronic anticoagulation, and liver abscess. Caleb Hernandez has no complaints today. He was hospitalized at [facility] 11/20/2016 with a liver abscess of an infection nature. Cultures were significant for E-Coli and bacteroides. He underwent extensive treatment with antibiotic therapy and JP drain was placed to facilitate drainage of the abscess.  He has since followed-up with infectious disease, JP drain was removed, and he has completed his extended course of antibiotics. Caleb Hernandez suffers from diabetes. He doesn't monitor his blood sugar at home. Denies urinary frequency, neuropathic pain, or increased thirst. Last A1C 6.3.  He reports adherence with medication therapy, although doesn't routinely monitor blood pressure at home.  Caleb Hernandez was referred to gastroenterology for evaluation of pancreatic cyst identified on CT of abdomen during a hospital admission 09/2016. He was evaluated at [facility] by PA Earnie Larsson on 12/22/2016. During that visit, it was recommended to repeat CT in 12 months to evaluate liver cyst and post-pone colonoscopy due patient's current active comorbid conditions. Recommended GI follow-up in 6 months.  Social History   Socioeconomic History  . Marital status: Single    Spouse name: Not on file  . Number of children: Not on file  . Years of education: Not on file  . Highest education level: Not on file  Social Needs  . Financial resource strain: Not on file  . Food insecurity - worry: Not on file  . Food insecurity - inability: Not on file  . Transportation needs - medical: Not on file  . Transportation  needs - non-medical: Not on file  Occupational History  . Not on file  Tobacco Use  . Smoking status: Former Smoker    Last attempt to quit: 09/28/2016    Years since quitting: 0.3  . Smokeless tobacco: Never Used  Substance and Sexual Activity  . Alcohol use: Yes    Comment: 40 oz per day/ former  . Drug use: No  . Sexual activity: Yes    Partners: Female  Other Topics Concern  . Not on file  Social History Narrative  . Not on file    Family History  Problem Relation Age of Onset  . Heart disease Brother   . Diabetes Mother   . Hypertension Mother   . Diabetes Father   . Hypercalcemia Father   . Stomach cancer Brother   . AAA (abdominal aortic aneurysm) Brother   . Colon cancer Neg Hx    Review of Systems See HPI  Patient Active Problem List   Diagnosis Date Noted  . Status post placement of implantable loop recorder 11/17/2016  . Chronic anticoagulation 10/07/2016  . Cerebellar stroke, acute ([place]) 10/05/2016  . Positive RPR test 09/27/2016  . Alcohol use disorder 09/26/2016  . Increased band cell count 09/26/2016  . 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 Abdominal pain 09/17/2016  . GI bleed 09/17/2016  . Essential hypertension 09/17/2016  . Type 2 diabetes mellitus, without long-term current use of insulin ([place]) 09/17/2016  . Diverticulitis 09/17/2016  . Nondependent alcohol abuse 09/17/2016  . Tobacco use 09/17/2016  . Bilateral  bunions 12/18/2014  . Onychomycosis of toenail 12/18/2014  . Tinea pedis of both feet 12/18/2014  . Early cataracts, bilateral 01/03/2014  . Male hypogonadism 11/16/2013  . Hypercholesterolemia 04/13/2013  . Proteinuria 04/13/2013  . 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/2013  . Vitamin D deficiency 04/13/2013  . Allergic rhinitis 08/28/2012  . Corns and callosity 08/28/2012  . Tinea pedis 08/28/2012  . Encounter for long-term (current) use of other medications 08/28/2012  . First degree heart  block 08/28/2012  . Premature beats 08/28/2012  . Undiagnosed cardiac murmurs 04/20/2012    No Known Allergies  Prior to Admission medications   Medication Sig Start Date End Date Taking? Authorizing Provider  amLODipine (NORVASC) 5 MG tablet Take 1 tablet (5 mg total) by mouth every other day. 11/18/16 02/16/17  Richardo Priest, MD  apixaban (ELIQUIS) 5 MG TABS tablet Take 1 tablet (5 mg total) by mouth 2 (two) times daily. 11/07/16   Tresa Garter, MD  folic acid (FOLVITE) 1 MG tablet Take 1 tablet (1 mg total) by mouth daily. 10/05/16   Scot Jun, FNP  lisinopril (PRINIVIL,ZESTRIL) 20 MG tablet TAKE 1 &1/2 TABLETS BY MOUTH DAILY 01/29/17   Scot Jun, FNP  lisinopril (PRINIVIL,ZESTRIL) 20 MG tablet TAKE 1 &1/2 TABLETS BY MOUTH DAILY 01/29/17   Scot Jun, FNP  Magnesium 250 MG TABS Take 1 tablet by mouth daily.    [provider]  metFORMIN (GLUCOPHAGE) 500 MG tablet Take 1 tablet (500 mg total) by mouth 2 (two) times daily with a meal. 10/05/16   Scot Jun, FNP  Omega-3 Fatty Acids (FISH OIL) 600 MG CAPS Take 2 capsules by mouth 2 (two) times daily.    [provider]  pantoprazole (PROTONIX) 40 MG tablet Take 1 tablet (40 mg total) by mouth daily. 10/05/16   Scot Jun, FNP  pravastatin (PRAVACHOL) 20 MG tablet Take 1 tablet (20 mg total) by mouth daily. 10/05/16   Scot Jun, FNP  Thiamine HCl (VITAMIN B-1) 250 MG tablet Take 250 mg by mouth daily.    [provider]   Past Medical, Surgical Family and Social History reviewed and updated.   Objective:   Today's Vitals   02/03/17 0950  BP: (!) 140/92  Pulse: 87  Temp: 98.7 F (37.1 C)  TempSrc: Oral  SpO2: 99%  Weight: 161 lb (73 kg)  Height: 5\' 4"  (1.626 m)    Wt Readings from Last 3 Encounters:  02/03/17 161 lb (73 kg)  12/22/16 149 lb 2 oz (67.6 kg)  11/18/16 139 lb 6.4 oz (63.2 kg)   Physical Exam  Constitutional: He is oriented to person,  place, and time. He appears well-developed and well-nourished.  HENT:  Head: Normocephalic and atraumatic.  Eyes: Conjunctivae and EOM are normal. Pupils are equal, round, and reactive to light.  Neck: Normal range of motion. Neck supple. No thyromegaly present.  Cardiovascular: Normal rate, regular rhythm, normal heart sounds and intact distal pulses.  Pulmonary/Chest: Effort normal and breath sounds normal.  Abdominal: Soft. Bowel sounds are normal. He exhibits no distension. There is no tenderness.  Musculoskeletal: Normal range of motion.  Lymphadenopathy:    He has no cervical adenopathy.  Neurological: He is alert and oriented to person, place, and time.  Skin: Skin is warm and dry.  Psychiatric: He has a normal mood and affect. His behavior is normal. Judgment and thought content normal.    Assessment & Plan:  1. Type 2 diabetes mellitus with  complication, without long-term current use of insulin ([place]), A1C increased 9.1, uncontrolled.   -Increasing metformin 1000 mg twice daily with meals -Adding Januvia 25 mg once daily. -Encouraged physical activity as tolerated and modified carbohydrate diet.  -Weight increased approximately 12 lbs since last follow-up. Current Body mass index is 27.64 kg/m.  2. Essential hypertension, elevated today, not at goal, <130/90 -Defer changes in medication today. Patient took medications moments prior to appointment today. Encouraged to routinely monitor blood pressure at local retail stores to ensure current therapy is effective in managing blood pressure.   3. Hypomagnesemia, previously low. He is currently prescribed oral replacement of magnesium. Will repeat magnesium level today.  Meds ordered this encounter  Medications  . metFORMIN (GLUCOPHAGE) 500 MG tablet    Sig: Take 2 tablets (1,000 mg total) 2 (two) times daily with a meal by mouth.    Dispense:  180 tablet    Refill:  3    Order Specific Question:   Supervising Provider     Answer:   Tresa Garter [id number]  . sitaGLIPtin (JANUVIA) 25 MG tablet    Sig: Take 1 tablet (25 mg total) daily by mouth.    Dispense:  90 tablet    Refill:  1    Order Specific Question:   Supervising Provider    Answer:   Tresa Garter [id number]   Orders Placed This Encounter  Procedures  . Magnesium  . COMPLETE METABOLIC PANEL WITH GFR  . POCT glycosylated hemoglobin (Hb A1C)    RTC:  6 month follow-up chronic conditions, or  sooner if needed.   Carroll Sage. Kenton Kingfisher, MSN, FNP-C [facility]  [address] [place] [place], [place] [address] [phone]

## 2017-02-03 NOTE — Patient Instructions (Addendum)
To improve control of diabetes, I have increased your metformin 1000 mg twice daily with meals. I am also adding Januvia 25 mg once daily which will also help to improve your A1C.   Diabetes Mellitus and Food It is important for you to manage your blood sugar (glucose) level. Your blood glucose level can be greatly affected by what you eat. Eating healthier foods in the appropriate amounts throughout the day at about the same time each day will help you control your blood glucose level. It can also help slow or prevent worsening of your diabetes mellitus. Healthy eating may even help you improve the level of your blood pressure and reach or maintain a healthy weight. General recommendations for healthful eating and cooking habits include:  Eating meals and snacks regularly. Avoid going long periods of time without eating to lose weight.  Eating a diet that consists mainly of plant-based foods, such as fruits, vegetables, nuts, legumes, and whole grains.  Using low-heat cooking methods, such as baking, instead of high-heat cooking methods, such as deep frying.  Work with your dietitian to make sure you understand how to use the Nutrition Facts information on food labels. How can food affect me? Carbohydrates Carbohydrates affect your blood glucose level more than any other type of food. Your dietitian will help you determine how many carbohydrates to eat at each meal and teach you how to count carbohydrates. Counting carbohydrates is important to keep your blood glucose at a healthy level, especially if you are using insulin or taking certain medicines for diabetes mellitus. Alcohol Alcohol can cause sudden decreases in blood glucose (hypoglycemia), especially if you use insulin or take certain medicines for diabetes mellitus. Hypoglycemia can be a life-threatening condition. Symptoms of hypoglycemia (sleepiness, dizziness, and disorientation) are similar to symptoms of having too much alcohol. If  your health care provider has given you approval to drink alcohol, do so in moderation and use the following guidelines:  Women should not have more than one drink per day, and men should not have more than two drinks per day. One drink is equal to: ? 12 oz of beer. ? 5 oz of wine. ? 1 oz of hard liquor.  Do not drink on an empty stomach.  Keep yourself hydrated. Have water, diet soda, or unsweetened iced tea.  Regular soda, juice, and other mixers might contain a lot of carbohydrates and should be counted.  What foods are not recommended? As you make food choices, it is important to remember that all foods are not the same. Some foods have fewer nutrients per serving than other foods, even though they might have the same number of calories or carbohydrates. It is difficult to get your body what it needs when you eat foods with fewer nutrients. Examples of foods that you should avoid that are high in calories and carbohydrates but low in nutrients include:  Trans fats (most processed foods list trans fats on the Nutrition Facts label).  Regular soda.  Juice.  Candy.  Sweets, such as cake, pie, doughnuts, and cookies.  Fried foods.  What foods can I eat? Eat nutrient-rich foods, which will nourish your body and keep you healthy. The food you should eat also will depend on several factors, including:  The calories you need.  The medicines you take.  Your weight.  Your blood glucose level.  Your blood pressure level.  Your cholesterol level.  You should eat a variety of foods, including:  Protein. ? Lean cuts of  meat. ? Proteins low in saturated fats, such as fish, egg whites, and beans. Avoid processed meats.  Fruits and vegetables. ? Fruits and vegetables that may help control blood glucose levels, such as apples, mangoes, and yams.  Dairy products. ? Choose fat-free or low-fat dairy products, such as milk, yogurt, and cheese.  Grains, bread, pasta, and  rice. ? Choose whole grain products, such as multigrain bread, whole oats, and brown rice. These foods may help control blood pressure.  Fats. ? Foods containing healthful fats, such as nuts, avocado, olive oil, canola oil, and fish.  Does everyone with diabetes mellitus have the same meal plan? Because every person with diabetes mellitus is different, there is not one meal plan that works for everyone. It is very important that you meet with a dietitian who will help you create a meal plan that is just right for you. This information is not intended to replace advice given to you by your health care provider. Make sure you discuss any questions you have with your health care provider. Document Released: 12/05/2004 Document Revised: 08/16/2015 Document Reviewed: 02/04/2013 Elsevier Interactive Patient Education  2017 [employer]. Steps to Quit Smoking Smoking tobacco can be bad for your health. It can also affect almost every organ in your body. Smoking puts you and people around you at risk for many serious long-lasting (chronic) diseases. Quitting smoking is hard, but it is one of the best things that you can do for your health. It is never too late to quit. What are the benefits of quitting smoking? When you quit smoking, you lower your risk for getting serious diseases and conditions. They can include:  Lung cancer or lung disease.  Heart disease.  Stroke.  Heart attack.  Not being able to have children (infertility).  Weak bones (osteoporosis) and broken bones (fractures).  If you have coughing, wheezing, and shortness of breath, those symptoms may get better when you quit. You may also get sick less often. If you are pregnant, quitting smoking can help to lower your chances of having a baby of low birth weight. What can I do to help me quit smoking? Talk with your doctor about what can help you quit smoking. Some things you can do (strategies) include:  Quitting smoking  totally, instead of slowly cutting back how much you smoke over a period of time.  Going to in-person counseling. You are more likely to quit if you go to many counseling sessions.  Using resources and support systems, such as: ? Database administrator with a Social worker. ? Phone quitlines. ? Careers information officer. ? Support groups or group counseling. ? Text messaging programs. ? Mobile phone apps or applications.  Taking medicines. Some of these medicines may have nicotine in them. If you are pregnant or breastfeeding, do not take any medicines to quit smoking unless your doctor says it is okay. Talk with your doctor about counseling or other things that can help you.  Talk with your doctor about using more than one strategy at the same time, such as taking medicines while you are also going to in-person counseling. This can help make quitting easier. What things can I do to make it easier to quit? Quitting smoking might feel very hard at first, but there is a lot that you can do to make it easier. Take these steps:  Talk to your family and friends. Ask them to support and encourage you.  Call phone quitlines, reach out to support groups, or work  with a Social worker.  Ask people who smoke to not smoke around you.  Avoid places that make you want (trigger) to smoke, such as: ? Bars. ? Parties. ? Smoke-break areas at work.  Spend time with people who do not smoke.  Lower the stress in your life. Stress can make you want to smoke. Try these things to help your stress: ? Getting regular exercise. ? Deep-breathing exercises. ? Yoga. ? Meditating. ? Doing a body scan. To do this, close your eyes, focus on one area of your body at a time from head to toe, and notice which parts of your body are tense. Try to relax the muscles in those areas.  Download or buy apps on your mobile phone or tablet that can help you stick to your quit plan. There are many free apps, such as QuitGuide from the [employer]  Office manager for Disease Control and Prevention). You can find more support from smokefree.gov and other websites.  This information is not intended to replace advice given to you by your health care provider. Make sure you discuss any questions you have with your health care provider. Document Released: 01/04/2009 Document Revised: 11/06/2015 Document Reviewed: 07/25/2014 Elsevier Interactive Patient Education  2018 [employer].

## 2017-02-04 ENCOUNTER — Ambulatory Visit (HOSPITAL_COMMUNITY)
Admission: RE | Admit: 2017-02-04 | Discharge: 2017-02-04 | Disposition: A | Discharge status: Home / Self Care | Payer: Self-pay | Source: Ambulatory Visit | Attending: Family Medicine | Admitting: Family Medicine

## 2017-02-04 ENCOUNTER — Telehealth: Payer: Self-pay | Admitting: Family Medicine

## 2017-02-04 DIAGNOSIS — E118 Type 2 diabetes mellitus with unspecified complications: Secondary | ICD-10-CM | POA: Insufficient documentation

## 2017-02-04 LAB — GLUCOSE, CAPILLARY
GLUCOSE-CAPILLARY: 229 mg/dL — AB (ref 65–99)
Glucose-Capillary: 356 mg/dL — ABNORMAL HIGH (ref 65–99)

## 2017-02-04 MED ORDER — INSULIN ASPART 100 UNIT/ML ~~LOC~~ SOLN
10.0000 [IU] | Freq: Once | SUBCUTANEOUS | Status: AC
  Administered 2017-02-04: 10 [IU] via SUBCUTANEOUS
  Filled 2017-02-04: qty 0.1

## 2017-02-04 MED ORDER — SODIUM CHLORIDE 0.9 % IV SOLN
INTRAVENOUS | Status: DC
  Administered 2017-02-04: 10:00:00 via INTRAVENOUS

## 2017-02-04 MED FILL — !ELIQUIS 5 MG TABLET: 5 | 30 days supply | Qty: 60 | Fill #3

## 2017-02-04 NOTE — Progress Notes (Signed)
Diagnosis: Hyperglycemia  Provider: Lavell Anchors, FNP  Procedure:  Hydration with IV NS and administration of subQ insulin for hyperglycemia. Patient discharged in satisfactory condition.

## 2017-02-04 NOTE — Telephone Encounter (Signed)
Received a message from answering service overnight that patient's glucose was critically elevated greater than 500.  Spoke with Caleb Hernandez this morning he is having a family member bring him over to the patient care center for fluid infusion, monitoring of glucose, and insulin therapy if necessary here in office.  Patient is alert, oriented, and denies any acute symptoms of hyperglycemia.   Caleb Hernandez. Caleb Kingfisher, MSN, FNP-C [facility]  [address] Caleb Hernandez Caleb Hernandez, [place] [address] [phone]

## 2017-02-09 ENCOUNTER — Ambulatory Visit: Payer: Self-pay | Attending: Family Medicine

## 2017-02-16 ENCOUNTER — Ambulatory Visit (INDEPENDENT_AMBULATORY_CARE_PROVIDER_SITE_OTHER): Payer: Self-pay | Admitting: *Deleted

## 2017-02-16 DIAGNOSIS — I639 Cerebral infarction, unspecified: Secondary | ICD-10-CM

## 2017-02-16 NOTE — Progress Notes (Signed)
Carelink Summary Report / Loop Recorder 

## 2017-02-17 ENCOUNTER — Ambulatory Visit (INDEPENDENT_AMBULATORY_CARE_PROVIDER_SITE_OTHER): Payer: Self-pay | Admitting: Family Medicine

## 2017-02-17 VITALS — BP 142/88

## 2017-02-17 DIAGNOSIS — Z23 Encounter for immunization: Secondary | ICD-10-CM

## 2017-02-17 DIAGNOSIS — I10 Essential (primary) hypertension: Secondary | ICD-10-CM

## 2017-02-17 DIAGNOSIS — E119 Type 2 diabetes mellitus without complications: Secondary | ICD-10-CM

## 2017-02-17 LAB — GLUCOSE, CAPILLARY: Glucose-Capillary: 295 mg/dL — ABNORMAL HIGH (ref 65–99)

## 2017-02-17 MED ORDER — CLONIDINE HCL 0.1 MG PO TABS
0.1000 mg | ORAL_TABLET | Freq: Once | ORAL | Status: AC
  Administered 2017-02-17: 0.1 mg via ORAL

## 2017-02-17 NOTE — Progress Notes (Signed)
Treated accelerated hypertension with 0.1 Clonidine. Patient to return in 4 weeks for a blood pressure recheck.

## 2017-02-18 ENCOUNTER — Ambulatory Visit: Payer: Self-pay | Attending: Internal Medicine

## 2017-02-18 ENCOUNTER — Telehealth: Payer: Self-pay | Admitting: Family Medicine

## 2017-02-18 MED ORDER — BLOOD GLUCOSE MONITOR KIT: PACK | 0 refills | Status: AC

## 2017-02-18 MED ORDER — INSULIN GLARGINE 100 UNIT/ML ~~LOC~~ SOLN: 10.0000 [IU] | Freq: Every day | SUBCUTANEOUS | 11 refills | Status: DC

## 2017-02-18 MED ORDER — BLOOD GLUCOSE MONITOR KIT: PACK | 0 refills | Status: DC

## 2017-02-18 MED ORDER — INSULIN PEN NEEDLE 29G X 12.7MM MISC: 2 refills | Status: DC

## 2017-02-18 NOTE — Telephone Encounter (Signed)
Please contact Mr. Perleberg to advise his glucose from his most recent nurse visits indicates that his oral antidiabetes medication is not adequately controlling his diabetes. I am prescribing him Lantus long acting insulin, 10 units at bedtime, along with glucometer. Advise Mr. Hopes to pickup medication and glucometer on Thursday and return to clinic Friday for nurse visit in order to receive a educational demonstration on how to adequately administer lantus and glucometer use. Prescriptions sent to the  [facility].  Carroll Sage. Kenton Kingfisher, MSN, FNP-C [facility]  [address] Barbara Cower [place], [place] [address] [phone]

## 2017-02-19 MED FILL — !TRUE METRIX BLOOD GLUCOSE: 30 days supply | Qty: 1 | Fill #0

## 2017-02-19 MED FILL — TRUE METRIX GLUCOSE TEST ST: 50 days supply | Qty: 200 | Fill #0

## 2017-02-19 MED FILL — TRUEplus LANCETS 30G MISC: 50 days supply | Qty: 200 | Fill #0

## 2017-02-19 MED FILL — $LANTUS 100 UNITS/ML VIAL: 100 | 28 days supply | Qty: 10 | Fill #0

## 2017-02-19 NOTE — Telephone Encounter (Signed)
Patient notified and will try to pickup his medication as he doesn't have a ride.

## 2017-02-23 ENCOUNTER — Telehealth: Payer: Self-pay | Admitting: Family Medicine

## 2017-02-23 MED ORDER — INSULIN PEN NEEDLE 30G X 8 MM MISC
1.0000 | 2 refills | Status: AC | PRN
Start: 2017-02-23 — End: ?

## 2017-02-23 NOTE — Telephone Encounter (Signed)
Medication was pick up for the Lantus vials.

## 2017-02-23 NOTE — Telephone Encounter (Signed)
Order was placed for vial of Lantus. I have ordered insulin syringes. Please contact the pharmacy to ensure patient is able to pick up medication today.   Caleb Hernandez. Kenton Kingfisher, MSN, FNP-C [facility]  [address] Barbara Cower [place], [place] [address] [phone]

## 2017-02-23 NOTE — Telephone Encounter (Signed)
Order was placed for pin needles and per Estill Bamberg at the pharmacy patient needs vials instead. Please advise.

## 2017-02-24 ENCOUNTER — Other Ambulatory Visit: Payer: Self-pay | Admitting: Family Medicine

## 2017-02-24 MED ORDER — "INSULIN SYRINGE-NEEDLE U-100 28G X 1/2"" 1 ML MISC": 0 refills | Status: AC

## 2017-02-24 MED FILL — TRUEPLUS SYR 1ML 30GX5/16": 30G X 5/16" | 30 days supply | Qty: 100 | Fill #0

## 2017-02-24 MED FILL — TRUEPLUS SYR 1ML 30GX5/16: 30G X 5/16" | 30 days supply | Qty: 100 | Fill #0

## 2017-02-24 NOTE — Progress Notes (Signed)
Refilled syringes needed sent for patient's insulin.

## 2017-02-26 ENCOUNTER — Other Ambulatory Visit: Payer: Self-pay | Admitting: *Deleted

## 2017-02-26 LAB — CUP PACEART REMOTE DEVICE CHECK
Implantable Pulse Generator Implant Date: 20180727
MDC IDC SESS DTM: 20181124203817

## 2017-02-26 MED ORDER — SITAGLIPTIN PHOSPHATE 25 MG PO TABS: 25.0000 mg | ORAL_TABLET | Freq: Every day | ORAL | 3 refills | Status: DC

## 2017-02-26 MED ORDER — INSULIN GLARGINE 100 UNIT/ML ~~LOC~~ SOLN: 10.0000 [IU] | Freq: Every day | SUBCUTANEOUS | 3 refills | Status: DC

## 2017-02-26 NOTE — Telephone Encounter (Signed)
PRINTED FOR PASS PROGRAM 

## 2017-03-02 MED FILL — ?METFORMIN HCL 500MG TABLET: 500 | 30 days supply | Qty: 120 | Fill #1

## 2017-03-02 MED FILL — LISINOPRIL 20 MG TAB: 20 | 30 days supply | Qty: 45 | Fill #1

## 2017-03-02 MED FILL — AMLODIPINE BESYLATE 5 MG TA: 5 | 30 days supply | Qty: 30 | Fill #4

## 2017-03-02 MED FILL — JANUVIA 25 MG TABLET: 25 | 30 days supply | Qty: 30 | Fill #1

## 2017-03-02 MED FILL — PRAVASTATIN NA 20 MG TAB: 20 | 30 days supply | Qty: 30 | Fill #4

## 2017-03-02 MED FILL — ?PANTOPRAZOLE SOD DR 40MG: 40 MG | 30 days supply | Qty: 30 | Fill #4

## 2017-03-02 MED FILL — FOLIC ACID 1 MG TABLET: 1 | 30 days supply | Qty: 30 | Fill #4

## 2017-03-16 ENCOUNTER — Ambulatory Visit (INDEPENDENT_AMBULATORY_CARE_PROVIDER_SITE_OTHER): Payer: Self-pay | Admitting: *Deleted

## 2017-03-16 DIAGNOSIS — I639 Cerebral infarction, unspecified: Secondary | ICD-10-CM

## 2017-03-18 ENCOUNTER — Ambulatory Visit: Payer: Self-pay | Admitting: Family Medicine

## 2017-03-18 VITALS — BP 122/80 | HR 80

## 2017-03-18 DIAGNOSIS — Z0130 Encounter for examination of blood pressure without abnormal findings: Secondary | ICD-10-CM

## 2017-03-18 NOTE — Progress Notes (Signed)
Carelink Summary Report / Loop Recorder 

## 2017-03-26 LAB — CUP PACEART REMOTE DEVICE CHECK
Implantable Pulse Generator Implant Date: 20180727
MDC IDC SESS DTM: 20181224210831

## 2017-03-27 MED FILL — FOLIC ACID 1 MG TABLET: 1 | 30 days supply | Qty: 30 | Fill #5

## 2017-03-27 MED FILL — AMLODIPINE BESYLATE 5 MG TA: 5 | 30 days supply | Qty: 30 | Fill #5

## 2017-03-27 MED FILL — ?PANTOPRAZOLE SOD DR 40MG: 40 MG | 30 days supply | Qty: 30 | Fill #5

## 2017-03-27 MED FILL — $LANTUS 100 UNITS/ML VIAL: 100 | 28 days supply | Qty: 10 | Fill #1

## 2017-03-27 MED FILL — JANUVIA 25 MG TABLET: 25 | 30 days supply | Qty: 30 | Fill #2

## 2017-03-27 MED FILL — PRAVASTATIN NA 20 MG TAB: 20 | 30 days supply | Qty: 30 | Fill #5

## 2017-03-27 MED FILL — ?METFORMIN HCL 500MG TABLET: 500 | 30 days supply | Qty: 120 | Fill #2

## 2017-03-27 MED FILL — LISINOPRIL 20 MG TAB: 20 | 30 days supply | Qty: 45 | Fill #2

## 2017-04-15 ENCOUNTER — Ambulatory Visit (INDEPENDENT_AMBULATORY_CARE_PROVIDER_SITE_OTHER): Payer: Self-pay | Admitting: *Deleted

## 2017-04-15 DIAGNOSIS — I639 Cerebral infarction, unspecified: Secondary | ICD-10-CM

## 2017-04-16 NOTE — Progress Notes (Signed)
Carelink Summary Report / Loop Recorder 

## 2017-04-24 LAB — CUP PACEART REMOTE DEVICE CHECK
Date Time Interrogation Session: 20190123214004
Implantable Pulse Generator Implant Date: 20180727

## 2017-04-27 ENCOUNTER — Other Ambulatory Visit: Payer: Self-pay | Admitting: Family Medicine

## 2017-04-27 MED FILL — $JANUVIA 25 MG TABLET: 25 | 30 days supply | Qty: 30 | Fill #3

## 2017-04-27 MED FILL — ?AMLODIPINE BESYLATE 5 MG T: 5 MG | 30 days supply | Qty: 30 | Fill #6

## 2017-04-27 MED FILL — LISINOPRIL 20 MG TAB: 20 | 30 days supply | Qty: 45 | Fill #3

## 2017-04-27 MED FILL — ?METFORMIN HCL 500MG TABLET: 500 | 30 days supply | Qty: 120 | Fill #3

## 2017-04-27 MED FILL — PRAVASTATIN NA 20 MG TAB: 20 | 30 days supply | Qty: 30 | Fill #0

## 2017-04-27 MED FILL — ?FOLIC ACID 1 MG TABLET: 1 | 30 days supply | Qty: 30 | Fill #0

## 2017-05-12 ENCOUNTER — Ambulatory Visit (INDEPENDENT_AMBULATORY_CARE_PROVIDER_SITE_OTHER): Payer: Self-pay | Admitting: Family Medicine

## 2017-05-12 ENCOUNTER — Encounter: Payer: Self-pay | Admitting: Family Medicine

## 2017-05-12 VITALS — BP 140/80 | HR 85 | Temp 98.9°F | Resp 12 | Ht 64.0 in | Wt 180.0 lb

## 2017-05-12 DIAGNOSIS — Z794 Long term (current) use of insulin: Secondary | ICD-10-CM

## 2017-05-12 DIAGNOSIS — I10 Essential (primary) hypertension: Secondary | ICD-10-CM

## 2017-05-12 DIAGNOSIS — E118 Type 2 diabetes mellitus with unspecified complications: Secondary | ICD-10-CM

## 2017-05-12 LAB — POCT URINALYSIS DIP (DEVICE)
BILIRUBIN URINE: NEGATIVE
Glucose, UA: NEGATIVE mg/dL
Hgb urine dipstick: NEGATIVE
KETONES UR: NEGATIVE mg/dL
Leukocytes, UA: NEGATIVE
Nitrite: NEGATIVE
PH: 5.5 (ref 5.0–8.0)
Protein, ur: NEGATIVE mg/dL
SPECIFIC GRAVITY, URINE: 1.02 (ref 1.005–1.030)
Urobilinogen, UA: 0.2 mg/dL (ref 0.0–1.0)

## 2017-05-12 LAB — POCT GLYCOSYLATED HEMOGLOBIN (HGB A1C): HEMOGLOBIN A1C: 7

## 2017-05-12 NOTE — Patient Instructions (Addendum)
If blood sugar is 100 or less hold dose of insulin. Continue lantus at 10 units nightly.   Continue DASH Diet.    Continue blood pressure medication.   Contact me if you blood pressure is consistently greater than 150/90.   DASH Eating Plan DASH stands for "Dietary Approaches to Stop Hypertension." The DASH eating plan is a healthy eating plan that has been shown to reduce high blood pressure (hypertension). It may also reduce your risk for type 2 diabetes, heart disease, and stroke. The DASH eating plan may also help with weight loss. What are tips for following this plan? General guidelines  Avoid eating more than 2,300 mg (milligrams) of salt (sodium) a day. If you have hypertension, you may need to reduce your sodium intake to 1,500 mg a day.  Limit alcohol intake to no more than 1 drink a day for nonpregnant women and 2 drinks a day for men. One drink equals 12 oz of beer, 5 oz of wine, or 1 oz of hard liquor.  Work with your health care provider to maintain a healthy body weight or to lose weight. Ask what an ideal weight is for you.  Get at least 30 minutes of exercise that causes your heart to beat faster (aerobic exercise) most days of the week. Activities may include walking, swimming, or biking.  Work with your health care provider or diet and nutrition specialist (dietitian) to adjust your eating plan to your individual calorie needs. Reading food labels  Check food labels for the amount of sodium per serving. Choose foods with less than 5 percent of the Daily Value of sodium. Generally, foods with less than 300 mg of sodium per serving fit into this eating plan.  To find whole grains, look for the word "whole" as the first word in the ingredient list. Shopping  Buy products labeled as "low-sodium" or "no salt added."  Buy fresh foods. Avoid canned foods and premade or frozen meals. Cooking  Avoid adding salt when cooking. Use salt-free seasonings or herbs instead  of table salt or sea salt. Check with your health care provider or pharmacist before using salt substitutes.  Do not fry foods. Cook foods using healthy methods such as baking, boiling, grilling, and broiling instead.  Cook with heart-healthy oils, such as olive, canola, soybean, or sunflower oil. Meal planning   Eat a balanced diet that includes: ? 5 or more servings of fruits and vegetables each day. At each meal, try to fill half of your plate with fruits and vegetables. ? Up to 6-8 servings of whole grains each day. ? Less than 6 oz of lean meat, poultry, or fish each day. A 3-oz serving of meat is about the same size as a deck of cards. One egg equals 1 oz. ? 2 servings of low-fat dairy each day. ? A serving of nuts, seeds, or beans 5 times each week. ? Heart-healthy fats. Healthy fats called Omega-3 fatty acids are found in foods such as flaxseeds and coldwater fish, like sardines, salmon, and mackerel.  Limit how much you eat of the following: ? Canned or prepackaged foods. ? Food that is high in trans fat, such as fried foods. ? Food that is high in saturated fat, such as fatty meat. ? Sweets, desserts, sugary drinks, and other foods with added sugar. ? Full-fat dairy products.  Do not salt foods before eating.  Try to eat at least 2 vegetarian meals each week.  Eat more home-cooked food and less  restaurant, buffet, and fast food.  When eating at a restaurant, ask that your food be prepared with less salt or no salt, if possible. What foods are recommended? The items listed may not be a complete list. Talk with your dietitian about what dietary choices are best for you. Grains Whole-grain or whole-wheat bread. Whole-grain or whole-wheat pasta. Brown rice. [place]ena Morrow. Bulgur. Whole-grain and low-sodium cereals. Pita bread. Low-fat, low-sodium crackers. Whole-wheat flour tortillas. Vegetables Fresh or frozen vegetables (raw, steamed, roasted, or grilled). Low-sodium or  reduced-sodium tomato and vegetable juice. Low-sodium or reduced-sodium tomato sauce and tomato paste. Low-sodium or reduced-sodium canned vegetables. Fruits All fresh, dried, or frozen fruit. Canned fruit in natural juice (without added sugar). Meat and other protein foods Skinless chicken or [place]. Ground chicken or [place]. Pork with fat trimmed off. Fish and seafood. Egg whites. Dried beans, peas, or lentils. Unsalted nuts, nut butters, and seeds. Unsalted canned beans. Lean cuts of beef with fat trimmed off. Low-sodium, lean deli meat. Dairy Low-fat (1%) or fat-free (skim) milk. Fat-free, low-fat, or reduced-fat cheeses. Nonfat, low-sodium ricotta or cottage cheese. Low-fat or nonfat yogurt. Low-fat, low-sodium cheese. Fats and oils Soft margarine without trans fats. Vegetable oil. Low-fat, reduced-fat, or light mayonnaise and salad dressings (reduced-sodium). Canola, safflower, olive, soybean, and sunflower oils. Avocado. Seasoning and other foods Herbs. Spices. Seasoning mixes without salt. Unsalted popcorn and pretzels. Fat-free sweets. What foods are not recommended? The items listed may not be a complete list. Talk with your dietitian about what dietary choices are best for you. Grains Baked goods made with fat, such as croissants, muffins, or some breads. Dry pasta or rice meal packs. Vegetables Creamed or fried vegetables. Vegetables in a cheese sauce. Regular canned vegetables (not low-sodium or reduced-sodium). Regular canned tomato sauce and paste (not low-sodium or reduced-sodium). Regular tomato and vegetable juice (not low-sodium or reduced-sodium). Angie Fava. Olives. Fruits Canned fruit in a light or heavy syrup. Fried fruit. Fruit in cream or butter sauce. Meat and other protein foods Fatty cuts of meat. Ribs. Fried meat. Berniece Salines. Sausage. Bologna and other processed lunch meats. Salami. Fatback. Hotdogs. Bratwurst. Salted nuts and seeds. Canned beans with added salt. Canned or  smoked fish. Whole eggs or egg yolks. Chicken or [place] with skin. Dairy Whole or 2% milk, cream, and half-and-half. Whole or full-fat cream cheese. Whole-fat or sweetened yogurt. Full-fat cheese. Nondairy creamers. Whipped toppings. Processed cheese and cheese spreads. Fats and oils Butter. Stick margarine. Lard. Shortening. Ghee. Bacon fat. Tropical oils, such as coconut, palm kernel, or palm oil. Seasoning and other foods Salted popcorn and pretzels. Onion salt, garlic salt, seasoned salt, table salt, and sea salt. Worcestershire sauce. Tartar sauce. Barbecue sauce. Teriyaki sauce. Soy sauce, including reduced-sodium. Steak sauce. Canned and packaged gravies. Fish sauce. Oyster sauce. Cocktail sauce. Horseradish that you find on the shelf. Ketchup. Mustard. Meat flavorings and tenderizers. Bouillon cubes. Hot sauce and Tabasco sauce. Premade or packaged marinades. Premade or packaged taco seasonings. Relishes. Regular salad dressings. Where to find more information:  National Heart, Lung, and [place]: https://wilson-eaton.com/  American Heart Association: www.heart.org Summary  The DASH eating plan is a healthy eating plan that has been shown to reduce high blood pressure (hypertension). It may also reduce your risk for type 2 diabetes, heart disease, and stroke.  With the DASH eating plan, you should limit salt (sodium) intake to 2,300 mg a day. If you have hypertension, you may need to reduce your sodium intake to 1,500 mg a day.  When on the DASH eating plan, aim to eat more fresh fruits and vegetables, whole grains, lean proteins, low-fat dairy, and heart-healthy fats.  Work with your health care provider or diet and nutrition specialist (dietitian) to adjust your eating plan to your individual calorie needs. This information is not intended to replace advice given to you by your health care provider. Make sure you discuss any questions you have with your health care provider. Document  Released: 02/27/2011 Document Revised: 03/03/2016 Document Reviewed: 03/03/2016 Elsevier Interactive Patient Education  [employer].

## 2017-05-12 NOTE — Progress Notes (Signed)
Patient ID: Caleb Hernandez, male    DOB: 06/09/1960, 56 y.o.   MRN: [id number]  PCP: ,  S, FNP  Chief Complaint  Patient presents with  . Follow-up    medication management, A1C    Subjective:  HPI Caleb Hernandez is a 56 y.o. male presents for evaluation of diabetes and hypertension. Medical problems significant for hypertension, type 2 diabetes, CVA, chronic anticoagulation, and liver abscess.[place] last A1C 9.1 three months prior. He reports consistently taking medication and improvement of dietary choices. He continues to be inactive of exercise. Occasionally has a family member check blood pressure at home. Reports readings have remained less than 150/90. Denies neuropathic pain, visual disturbances, chest pain, shortness of breath, dizziness, and weakness. He is overdue for a diabetic eye exam, however is pending approval for medicaid. He has no other complaints today. Social History   Socioeconomic History  . Marital status: Single    Spouse name: Not on file  . Number of children: Not on file  . Years of education: Not on file  . Highest education level: Not on file  Social Needs  . Financial resource strain: Not on file  . Food insecurity - worry: Not on file  . Food insecurity - inability: Not on file  . Transportation needs - medical: Not on file  . Transportation needs - non-medical: Not on file  Occupational History  . Not on file  Tobacco Use  . Smoking status: Former Smoker    Last attempt to quit: 09/28/2016    Years since quitting: 0.6  . Smokeless tobacco: Never Used  Substance and Sexual Activity  . Alcohol use: Yes    Comment: 40 oz per day/ former  . Drug use: No  . Sexual activity: Yes    Partners: Female  Other Topics Concern  . Not on file  Social History Narrative  . Not on file    Family History  Problem Relation Age of Onset  . Heart disease Brother   . Diabetes Mother   . Hypertension Mother   . Diabetes Father   .  Hypercalcemia Father   . Stomach cancer Brother   . AAA (abdominal aortic aneurysm) Brother   . Colon cancer Neg Hx      Review of Systems  Patient Active Problem List   Diagnosis Date Noted  . Status post placement of implantable loop recorder 11/17/2016  . Chronic anticoagulation 10/07/2016  . Cerebellar stroke, acute (HCC) 10/05/2016  . Positive RPR test 09/27/2016  . Alcohol use disorder 09/26/2016  . Increased band cell count 09/26/2016  . 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 Abdominal pain 09/17/2016  . GI bleed 09/17/2016  . Essential hypertension 09/17/2016  . Type 2 diabetes mellitus, without long-term current use of insulin (HCC) 09/17/2016  . Diverticulitis 09/17/2016  . Nondependent alcohol abuse 09/17/2016  . Tobacco use 09/17/2016  . Bilateral bunions 12/18/2014  . Onychomycosis of toenail 12/18/2014  . Tinea pedis of both feet 12/18/2014  . Early cataracts, bilateral 01/03/2014  . Male hypogonadism 11/16/2013  . Hypercholesterolemia 04/13/2013  . Proteinuria 04/13/2013  . 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/2013  . Vitamin D deficiency 04/13/2013  . Allergic rhinitis 08/28/2012  . Corns and callosity 08/28/2012  . Tinea pedis 08/28/2012  . Encounter for long-term (current) use of other medications 08/28/2012  . First degree heart block 08/28/2012  . Premature beats 08/28/2012  . Undiagnosed cardiac murmurs 04/20/2012    No Known Allergies    Prior to Admission medications   Medication Sig Start Date End Date Taking? Authorizing Provider  apixaban (ELIQUIS) 5 MG TABS tablet Take 1 tablet (5 mg total) by mouth 2 (two) times daily. 11/07/16  Yes Jegede, Olugbemiga E, MD  blood glucose meter kit and supplies KIT Dispense based on patient and insurance preference. Use up to four times daily as directed. (FOR ICD-10. E.11.8). 02/18/17  Yes ,  S, FNP  folic acid (FOLVITE) 1 MG tablet TAKE 1 TABLET (1 MG TOTAL)  BY MOUTH DAILY. 04/27/17  Yes ,  S, FNP  insulin glargine (LANTUS) 100 UNIT/ML injection Inject 0.1 mLs (10 Units total) into the skin at bedtime. 02/26/17  Yes Jegede, Olugbemiga E, MD  Insulin Pen Needle (NOVOFINE) 30G X 8 MM MISC Inject 10 each into the skin as needed. 02/23/17  Yes ,  S, FNP  Insulin Syringe-Needle U-100 28G X 1/2" 1 ML MISC Use to administer insulin once daily. e11.8 02/24/17  Yes ,  S, FNP  lisinopril (PRINIVIL,ZESTRIL) 20 MG tablet TAKE 1 &1/2 TABLETS BY MOUTH DAILY 01/29/17  Yes ,  S, FNP  Magnesium 250 MG TABS Take 1 tablet by mouth daily.   Yes [provider]  metFORMIN (GLUCOPHAGE) 500 MG tablet Take 2 tablets (1,000 mg total) 2 (two) times daily with a meal by mouth. 02/03/17  Yes ,  S, FNP  Omega-3 Fatty Acids (FISH OIL) 600 MG CAPS Take 2 capsules by mouth 2 (two) times daily.   Yes [provider]  pravastatin (PRAVACHOL) 20 MG tablet TAKE 1 TABLET (20 MG TOTAL) BY MOUTH DAILY. 04/27/17  Yes ,  S, FNP  sitaGLIPtin (JANUVIA) 25 MG tablet Take 1 tablet (25 mg total) by mouth daily. 02/26/17  Yes Jegede, Olugbemiga E, MD  Thiamine HCl (VITAMIN B-1) 250 MG tablet Take 250 mg by mouth daily.   Yes [provider]  amLODipine (NORVASC) 5 MG tablet Take 1 tablet (5 mg total) by mouth every other day. 11/18/16 02/16/17  Munley, Brian J, MD  pantoprazole (PROTONIX) 40 MG tablet Take 1 tablet (40 mg total) by mouth daily. Patient not taking: Reported on 05/12/2017 10/05/16   ,  S, FNP    Past Medical, Surgical Family and Social History reviewed and updated.    Objective:   Today's Vitals   05/12/17 0945  BP: 140/80  Pulse: 85  Resp: 12  Temp: 98.9 F (37.2 C)  TempSrc: Oral  SpO2: 99%  Weight: 180 lb (81.6 kg)  Height: 5' 4" (1.626 m)    Wt Readings from Last 3 Encounters:  05/12/17 180 lb (81.6 kg)  02/03/17 161 lb (73 kg)  12/22/16 149 lb 2 oz  (67.6 kg)    Physical Exam Constitutional: Patient appears well-developed and well-nourished. No distress. HENT: Normocephalic, atraumatic, External right and left ear normal. Oropharynx is clear and moist.  Eyes: Conjunctivae and EOM are normal. PERRLA, no scleral icterus. Neck: Normal ROM. Neck supple. No JVD. No tracheal deviation. No thyromegaly. CVS: RRR, S1/S2 +, no murmurs, no gallops, no carotid bruit.  Pulmonary: Effort and breath sounds normal, no stridor, rhonchi, wheezes, rales.  Abdominal: Soft. BS +, no distension, tenderness, rebound or guarding.  Musculoskeletal: Normal range of motion. No edema and no tenderness.  Lymphadenopathy: No lymphadenopathy noted, cervical, inguinal or axillary Neuro: Alert. Normal reflexes, muscle tone coordination. No cranial nerve deficit. Skin: Skin is warm and dry. No rash noted. Not diaphoretic. No erythema. No pallor. Psychiatric: Normal mood and affect.   Behavior, judgment, thought content normal.   Assessment & Plan:  1. Type 2 diabetes mellitus with complication, with long-term current use of insulin (HCC), A1C 7.0, improved. Continue dietary management and portion control to facilitate weight loss and improve glycemic control.  Incorporate some form of physical activity with a goal of 150 minutes per week.  2. Essential hypertension, controlled today. We have discussed target BP range and blood pressure goal. I have advised patient to check BP regularly and to call us back or report to clinic if the numbers are consistently higher than 140/90. We discussed the importance of compliance with medical therapy and DASH diet recommended, consequences of uncontrolled hypertension discussed.  Continue current BP medications.   Orders Placed This Encounter  Procedures  . POCT glycosylated hemoglobin (Hb A1C)  . POCT urinalysis dip (device)   RTC: 3 months for chronic condition follow-up     S. , MSN, FNP-C [facility]  [address] [place], [place] [address] [phone]   

## 2017-05-12 NOTE — Progress Notes (Signed)
POCT

## 2017-05-18 ENCOUNTER — Ambulatory Visit (INDEPENDENT_AMBULATORY_CARE_PROVIDER_SITE_OTHER): Payer: Self-pay | Admitting: *Deleted

## 2017-05-18 DIAGNOSIS — I639 Cerebral infarction, unspecified: Secondary | ICD-10-CM

## 2017-05-19 NOTE — Progress Notes (Signed)
Carelink Summary Report / Loop Recorder 

## 2017-05-26 ENCOUNTER — Other Ambulatory Visit: Payer: Self-pay | Admitting: Family Medicine

## 2017-05-26 MED FILL — metFORMIN HCL 500 MG TABS: 500 | 30 days supply | Qty: 120 | Fill #4

## 2017-05-26 MED FILL — $JANUVIA 25 MG TABLET: 25 | 30 days supply | Qty: 30 | Fill #4

## 2017-05-26 MED FILL — $LANTUS 100 UNITS/ML VIAL: 100 | 28 days supply | Qty: 10 | Fill #2

## 2017-05-26 MED FILL — PRAVASTATIN NA 20 MG TAB: 20 | 30 days supply | Qty: 30 | Fill #1

## 2017-05-26 MED FILL — LISINOPRIL 20 MG TAB: 20 | 30 days supply | Qty: 45 | Fill #0

## 2017-05-26 MED FILL — FOLIC ACID 1 MG TABLET: 1 | 30 days supply | Qty: 30 | Fill #1

## 2017-05-26 MED FILL — AMLODIPINE BESYLATE 5 MG TA: 5 | 30 days supply | Qty: 30 | Fill #7

## 2017-05-28 ENCOUNTER — Other Ambulatory Visit: Payer: Self-pay | Admitting: Internal Medicine

## 2017-06-01 ENCOUNTER — Encounter: Payer: Self-pay | Admitting: Cardiology

## 2017-06-01 ENCOUNTER — Ambulatory Visit (INDEPENDENT_AMBULATORY_CARE_PROVIDER_SITE_OTHER): Payer: No Typology Code available for payment source | Admitting: Cardiology

## 2017-06-01 VITALS — BP 144/86 | HR 60 | Ht 64.0 in | Wt 183.8 lb

## 2017-06-01 DIAGNOSIS — Z7901 Long term (current) use of anticoagulants: Secondary | ICD-10-CM

## 2017-06-01 DIAGNOSIS — I639 Cerebral infarction, unspecified: Secondary | ICD-10-CM

## 2017-06-01 DIAGNOSIS — E7800 Pure hypercholesterolemia, unspecified: Secondary | ICD-10-CM

## 2017-06-01 DIAGNOSIS — I10 Essential (primary) hypertension: Secondary | ICD-10-CM

## 2017-06-01 NOTE — Progress Notes (Signed)
Cardiology Office Note:    Date:  06/01/2017   ID:  Caleb Hernandez, DOB 30-Jul-1960, MRN [id number]  PCP:  Caleb Jun, FNP  Cardiologist:  Shirlee More, MD    Referring MD: Caleb Jun, FNP    ASSESSMENT:    1. Cerebellar stroke, acute ([place])   2. Chronic anticoagulation   3. Essential hypertension   4. Hypercholesterolemia    PLAN:    In order of problems listed above:  1. Stable continue current anticoagulant antihypertensives and statin.  I reviewed his chart is not had a lipid profile recently and I drew one today. 2. Stable despite not finding atrial fibrillation a loop recorder I would continue his anticoagulant as the documentation can be delayed 6 months or later. 3. Stable continue current treatment including ACE inhibitor.  I asked him to begin screening his blood pressures at home. 4. Stable continue statin check lipid profile.   Next appointment: 6 months   Medication Adjustments/Labs and Tests Ordered: Current medicines are reviewed at length with the patient today.  Concerns regarding medicines are outlined above.  No orders of the defined types were placed in this encounter.  No orders of the defined types were placed in this encounter.   Chief Complaint  Patient presents with  . Follow-up    6 month flup appt     History of Present Illness:    Caleb Hernandez is a 57 y.o. male with a hx of Hypertension, hyperlipidemia, DM and cerebellar stroke with ILR last seen 11/18/16.  ASSESSMENT:   11/18/16   1. Cerebrovascular accident (CVA), unspecified mechanism ([place])   2. Status post placement of implantable loop recorder   3. Essential hypertension   4. Dyslipidemia   5. Cerebellar stroke, acute ([place])    PLAN:    1.   Stable, continue current DOAC 5. Stable, continue to monitor 6. Worsened, low BP will reduce his CCB to every other day 7. Stable, continue his stain with CVA 8. Concern for embolic source, he has a loop recorder and  is on apixaban   Compliance with diet, lifestyle and medications: Yes He feels he is slowly improved from his stroke still has some lower extremity weakness.  No chest pain palpitations syncope TIA or bleeding complication of his anti-coagulant.  I reviewed his loop recording download with the patient Past Medical History:  Diagnosis Date  . Abdominal pain 09/17/2016  . Alcohol use disorder 09/26/2016  . Allergic rhinitis 08/28/2012  . Bilateral bunions 12/18/2014  . Cerebellar stroke, acute ([place]) 10/05/2016  . 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 Corns and callosity 08/28/2012  . Diabetes mellitus without complication ([name])   . Diverticulitis 09/17/2016  . Early cataracts, bilateral 01/03/2014  . Encounter for long-term (current) use of other medications 08/28/2012  . Essential hypertension 09/17/2016  . First degree heart block 08/28/2012  . GI bleed 09/17/2016  . High cholesterol   . Hypercholesterolemia 04/13/2013  . Hypertension   . Increased band cell count 09/26/2016  . Male hypogonadism 11/16/2013  . Nondependent alcohol abuse 09/17/2016  . Onychomycosis of toenail 12/18/2014  . Positive RPR test 09/27/2016  . Premature beats 08/28/2012  . Proteinuria 04/13/2013  . 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/2013  . Tinea pedis 08/28/2012  . Tinea pedis of both feet 12/18/2014  . Tobacco use 09/17/2016  . Type 2 diabetes mellitus, without long-term current use of insulin ([place]) 09/17/2016  . Undiagnosed cardiac murmurs 04/20/2012  . Vitamin  D deficiency 04/13/2013    Past Surgical History:  Procedure Laterality Date  . BACK SURGERY    . LOOP RECORDER INSERTION N/A 10/17/2016   Procedure: Loop Recorder Insertion;  Surgeon: Evans Lance, MD;  Location: [facility];  Service: Cardiovascular;  Laterality: N/A;    Current Medications: Current Meds  Medication Sig  . amLODipine (NORVASC) 5 MG tablet Take 1 tablet (5 mg total) by mouth every other day.  Marland Kitchen  apixaban (ELIQUIS) 5 MG TABS tablet Take 1 tablet (5 mg total) by mouth 2 (two) times daily.  . blood glucose meter kit and supplies KIT Dispense based on patient and insurance preference. Use up to four times daily as directed. (FOR ICD-10. E.11.8).  . folic acid (FOLVITE) 1 MG tablet TAKE 1 TABLET (1 MG TOTAL) BY MOUTH DAILY.  Marland Kitchen insulin glargine (LANTUS) 100 UNIT/ML injection Inject 0.1 mLs (10 Units total) into the skin at bedtime.  . Insulin Pen Needle (NOVOFINE) 30G X 8 MM MISC Inject 10 each into the skin as needed.  . Insulin Syringe-Needle U-100 28G X 1/2" 1 ML MISC Use to administer insulin once daily. e11.8  . lisinopril (PRINIVIL,ZESTRIL) 20 MG tablet TAKE 1 AND 1/2 TABLETS BY MOUTH DAILY  . Magnesium 250 MG TABS Take 1 tablet by mouth daily.  . metFORMIN (GLUCOPHAGE) 500 MG tablet Take 2 tablets (1,000 mg total) 2 (two) times daily with a meal by mouth.  . Omega-3 Fatty Acids (FISH OIL) 600 MG CAPS Take 2 capsules by mouth 2 (two) times daily.  . pravastatin (PRAVACHOL) 20 MG tablet TAKE 1 TABLET (20 MG TOTAL) BY MOUTH DAILY.  . sitaGLIPtin (JANUVIA) 25 MG tablet Take 1 tablet (25 mg total) by mouth daily.  . Thiamine HCl (VITAMIN B-1) 250 MG tablet Take 250 mg by mouth daily.     Allergies:   Patient has no known allergies.   Social History   Socioeconomic History  . Marital status: Single    Spouse name: None  . Number of children: None  . Years of education: None  . Highest education level: None  Social Needs  . Financial resource strain: None  . Food insecurity - worry: None  . Food insecurity - inability: None  . Transportation needs - medical: None  . Transportation needs - non-medical: None  Occupational History  . None  Tobacco Use  . Smoking status: Former Smoker    Last attempt to quit: 09/28/2016    Years since quitting: 0.6  . Smokeless tobacco: Never Used  Substance and Sexual Activity  . Alcohol use: Yes    Comment: 40 oz per day/ former  . Drug use:  No  . Sexual activity: Yes    Partners: Female  Other Topics Concern  . None  Social History Narrative  . None     Family History: The patient's family history includes AAA (abdominal aortic aneurysm) in his brother; Diabetes in his father and mother; Heart disease in his brother; Hypercalcemia in his father; Hypertension in his mother; Stomach cancer in his brother. There is no history of Colon cancer. ROS:   Please see the history of present illness.    All other systems reviewed and are negative.  EKGs/Labs/Other Studies Reviewed:    The following studies were reviewed today:  ILR check 05/28/17: [place] first degree AVB APC's NO PAF NOTED TO DATE Recent Labs: 10/02/2016: Hemoglobin 13.9; Platelets 476 02/03/2017: ALT 26; BUN 13; Creat 0.76; Magnesium 1.6; Potassium 4.0; Sodium  130  Recent Lipid Panel No results found for: CHOL, TRIG, HDL, CHOLHDL, VLDL, LDLCALC, LDLDIRECT  Physical Exam:    VS:  BP (!) 144/86 (BP Location: Right Arm, Patient Position: Sitting, Cuff Size: Normal)   Pulse 60   Ht '5\' 4"'$  (1.626 m)   Wt 183 lb 12.8 oz (83.4 kg)   SpO2 98%   BMI 31.55 kg/m     Wt Readings from Last 3 Encounters:  06/01/17 183 lb 12.8 oz (83.4 kg)  05/12/17 180 lb (81.6 kg)  02/03/17 161 lb (73 kg)     GEN:  Well nourished, well developed in no acute distress HEENT: Normal NECK: No JVD; No carotid bruits LYMPHATICS: No lymphadenopathy CARDIAC: RRR, no murmurs, rubs, gallops RESPIRATORY:  Clear to auscultation without rales, wheezing or rhonchi  ABDOMEN: Soft, non-tender, non-distended MUSCULOSKELETAL:  No edema; No deformity  SKIN: Warm and dry NEUROLOGIC:  Alert and oriented x 3 PSYCHIATRIC:  Normal affect    Signed, Shirlee More, MD  06/01/2017 10:09 AM    [place]

## 2017-06-01 NOTE — Patient Instructions (Signed)
Medication Instructions:  Your physician recommends that you continue on your current medications as directed. Please refer to the Current Medication list given to you today.  Labwork: Your physician recommends that you have the following labs drawn: CMP, lipid  Testing/Procedures: None  Follow-Up: Your physician wants you to follow-up in: 6 months. You will receive a reminder letter in the mail two months in advance. If you don't receive a letter, please call our office to schedule the follow-up appointment.  Any Other Special Instructions Will Be Listed Below (If Applicable).     If you need a refill on your cardiac medications before your next appointment, please call your pharmacy.

## 2017-06-02 ENCOUNTER — Other Ambulatory Visit: Payer: Self-pay | Admitting: Cardiology

## 2017-06-02 ENCOUNTER — Telehealth: Payer: Self-pay

## 2017-06-02 DIAGNOSIS — E781 Pure hyperglyceridemia: Secondary | ICD-10-CM

## 2017-06-02 LAB — COMPREHENSIVE METABOLIC PANEL
A/G RATIO: 1.7 (ref 1.2–2.2)
ALT: 28 IU/L (ref 0–44)
AST: 20 IU/L (ref 0–40)
Albumin: 4.5 g/dL (ref 3.5–5.5)
Alkaline Phosphatase: 81 IU/L (ref 39–117)
BUN/Creatinine Ratio: 8 — ABNORMAL LOW (ref 9–20)
BUN: 8 mg/dL (ref 6–24)
Bilirubin Total: 0.3 mg/dL (ref 0.0–1.2)
CALCIUM: 9.9 mg/dL (ref 8.7–10.2)
CO2: 21 mmol/L (ref 20–29)
CREATININE: 0.96 mg/dL (ref 0.76–1.27)
Chloride: 101 mmol/L (ref 96–106)
GFR calc non Af Amer: 89 mL/min/{1.73_m2} (ref >59)
GFR, EST AFRICAN AMERICAN: 102 mL/min/{1.73_m2} (ref >59)
Globulin, Total: 2.6 g/dL (ref 1.5–4.5)
Glucose: 196 mg/dL — ABNORMAL HIGH (ref 65–99)
Potassium: 4.3 mmol/L (ref 3.5–5.2)
Sodium: 140 mmol/L (ref 134–144)
Total Protein: 7.1 g/dL (ref 6.0–8.5)

## 2017-06-02 LAB — LIPID PANEL
CHOL/HDL RATIO: 5.8 ratio — AB (ref 0.0–5.0)
Cholesterol, Total: 215 mg/dL — ABNORMAL HIGH (ref 100–199)
HDL: 37 mg/dL — AB (ref >39)
Triglycerides: 587 mg/dL (ref 0–149)

## 2017-06-02 MED ORDER — GEMFIBROZIL 600 MG PO TABS: 600.0000 mg | ORAL_TABLET | Freq: Two times a day (BID) | ORAL | 6 refills | Status: DC

## 2017-06-02 MED FILL — GEMFIBROZIL 600 MG TABLET: 600 | 30 days supply | Qty: 60 | Fill #0

## 2017-06-02 NOTE — Telephone Encounter (Signed)
Patient informed of results. Advised patient to start Lopid 600 mg twice daily. Patient requested prescription be sent to [facility]. Advised patient lab work would need to be repeated in 1 month. Patient verbalized understanding. No further questions.

## 2017-06-02 NOTE — Telephone Encounter (Signed)
-----   Message from Richardo Priest, MD sent at 06/02/2017  8:03 AM EDT ----- Hus TG are quite elevated, start lopid recheck 6 weeks

## 2017-06-15 ENCOUNTER — Other Ambulatory Visit: Payer: Self-pay | Admitting: Internal Medicine

## 2017-06-15 LAB — CUP PACEART REMOTE DEVICE CHECK
Date Time Interrogation Session: 20190225235647
MDC IDC PG IMPLANT DT: 20180727

## 2017-06-22 ENCOUNTER — Other Ambulatory Visit: Payer: Self-pay | Admitting: Family Medicine

## 2017-06-22 ENCOUNTER — Ambulatory Visit (INDEPENDENT_AMBULATORY_CARE_PROVIDER_SITE_OTHER): Payer: No Typology Code available for payment source | Admitting: *Deleted

## 2017-06-22 DIAGNOSIS — I639 Cerebral infarction, unspecified: Secondary | ICD-10-CM

## 2017-06-22 MED FILL — TRUE METRIX TEST STRIP: 25 days supply | Qty: 100 | Fill #0

## 2017-06-22 MED FILL — ?PRAVASTATIN SODIUM 20MG TA: 20 | 30 days supply | Qty: 30 | Fill #2

## 2017-06-22 MED FILL — ?AMLODIPINE BESYLATE 5 MG T: 5 MG | 30 days supply | Qty: 30 | Fill #8

## 2017-06-22 MED FILL — LISINOPRIL 20 MG TAB: 20 | 30 days supply | Qty: 45 | Fill #1

## 2017-06-22 MED FILL — ?METFORMIN HCL 500MG TABLET: 500 | 30 days supply | Qty: 120 | Fill #5

## 2017-06-22 MED FILL — FOLIC ACID 1 MG TABLET: 1 | 30 days supply | Qty: 30 | Fill #2

## 2017-06-22 MED FILL — $JANUVIA 25 MG TABLET: 25 | 30 days supply | Qty: 30 | Fill #5

## 2017-06-22 NOTE — Progress Notes (Signed)
Carelink Summary Report / Loop Recorder 

## 2017-06-29 ENCOUNTER — Telehealth: Payer: Self-pay | Admitting: Cardiology

## 2017-06-29 MED FILL — GEMFIBROZIL 600 MG TABLET: 600 | 30 days supply | Qty: 60 | Fill #1

## 2017-06-29 NOTE — Telephone Encounter (Signed)
Returned call

## 2017-06-29 NOTE — Telephone Encounter (Signed)
Spoke with pt, and he is aware of $25 fee for disability paper work that he needs completed. Pt will come by office to pay once he can afford payment.  Pt verbalized understanding.

## 2017-06-29 NOTE — Telephone Encounter (Signed)
Spoke with pt, and he is aware of $25 charge for disability paper work

## 2017-06-30 ENCOUNTER — Telehealth: Payer: Self-pay

## 2017-06-30 NOTE — Telephone Encounter (Signed)
Contacted patient to remind him to come to our office for repeat fasting lab work. Patient verbalized understanding and will come one day this week. Patient verbalized understanding. No further questions.

## 2017-07-02 LAB — LIPID PANEL W/O CHOL/HDL RATIO
CHOLESTEROL TOTAL: 161 mg/dL (ref 100–199)
HDL: 29 mg/dL — AB (ref >39)
LDL Calculated: 98 mg/dL (ref 0–99)
TRIGLYCERIDES: 169 mg/dL — AB (ref 0–149)
VLDL CHOLESTEROL CAL: 34 mg/dL (ref 5–40)

## 2017-07-02 LAB — COMPREHENSIVE METABOLIC PANEL
ALT: 23 IU/L (ref 0–44)
AST: 17 IU/L (ref 0–40)
Albumin/Globulin Ratio: 1.6 (ref 1.2–2.2)
Albumin: 4.8 g/dL (ref 3.5–5.5)
Alkaline Phosphatase: 88 IU/L (ref 39–117)
BILIRUBIN TOTAL: 0.3 mg/dL (ref 0.0–1.2)
BUN/Creatinine Ratio: 12 (ref 9–20)
BUN: 11 mg/dL (ref 6–24)
CALCIUM: 10.1 mg/dL (ref 8.7–10.2)
CO2: 22 mmol/L (ref 20–29)
CREATININE: 0.95 mg/dL (ref 0.76–1.27)
Chloride: 99 mmol/L (ref 96–106)
GFR calc Af Amer: 103 mL/min/{1.73_m2} (ref >59)
GFR calc non Af Amer: 89 mL/min/{1.73_m2} (ref >59)
GLOBULIN, TOTAL: 3 g/dL (ref 1.5–4.5)
Glucose: 133 mg/dL — ABNORMAL HIGH (ref 65–99)
Potassium: 4.6 mmol/L (ref 3.5–5.2)
Sodium: 139 mmol/L (ref 134–144)
TOTAL PROTEIN: 7.8 g/dL (ref 6.0–8.5)

## 2017-07-06 ENCOUNTER — Telehealth: Payer: Self-pay | Admitting: Cardiology

## 2017-07-06 NOTE — Telephone Encounter (Signed)
Left message on home machine stating if he was returning call from message last week, we spoke the next day and no need to return call. Advised if he has further questions then please do not hesitate to return call, would be happy to discuss any questions or concerns with him.

## 2017-07-06 NOTE — Telephone Encounter (Signed)
Returning your call-but he said just forget ot

## 2017-07-23 ENCOUNTER — Ambulatory Visit (INDEPENDENT_AMBULATORY_CARE_PROVIDER_SITE_OTHER): Payer: No Typology Code available for payment source | Admitting: *Deleted

## 2017-07-23 DIAGNOSIS — I639 Cerebral infarction, unspecified: Secondary | ICD-10-CM

## 2017-07-23 DIAGNOSIS — I441 Atrioventricular block, second degree: Secondary | ICD-10-CM | POA: Insufficient documentation

## 2017-07-27 ENCOUNTER — Other Ambulatory Visit: Payer: Self-pay | Admitting: Family Medicine

## 2017-07-27 LAB — CUP PACEART REMOTE DEVICE CHECK
Date Time Interrogation Session: 20190330233845
MDC IDC PG IMPLANT DT: 20180727

## 2017-07-27 MED FILL — ?METFORMIN HCL 500MG TABLET: 500 | 30 days supply | Qty: 120 | Fill #0

## 2017-07-27 MED FILL — AMLODIPINE BESYLATE 5 MG TA: 5 | 30 days supply | Qty: 30 | Fill #9

## 2017-07-27 MED FILL — FOLIC ACID 1 MG TABS: 1 | 30 days supply | Qty: 30 | Fill #3

## 2017-07-27 MED FILL — LISINOPRIL 20 MG TAB: 20 | 30 days supply | Qty: 45 | Fill #2

## 2017-07-27 MED FILL — ?PRAVASTATIN SODIUM 20MG TA: 20 | 30 days supply | Qty: 30 | Fill #3

## 2017-07-27 MED FILL — $JANUVIA 25 MG TABLET: 25 | 30 days supply | Qty: 30 | Fill #0

## 2017-07-27 NOTE — Progress Notes (Signed)
Carelink Summary Report / Loop Recorder 

## 2017-07-28 MED FILL — GEMFIBROZIL 600 MG TABLET: 600 | 30 days supply | Qty: 60 | Fill #2

## 2017-07-30 ENCOUNTER — Other Ambulatory Visit: Payer: Self-pay | Admitting: Internal Medicine

## 2017-08-03 ENCOUNTER — Ambulatory Visit: Payer: Self-pay | Admitting: Family Medicine

## 2017-08-05 ENCOUNTER — Telehealth: Payer: Self-pay | Admitting: Cardiology

## 2017-08-05 NOTE — Telephone Encounter (Signed)
Spoke w/ pt and requested that he send a manual transmission b/c his home monitor has not updated in at least 14 days.   

## 2017-08-12 ENCOUNTER — Encounter: Payer: Self-pay | Admitting: Cardiology

## 2017-08-14 ENCOUNTER — Ambulatory Visit: Payer: Self-pay | Admitting: Family Medicine

## 2017-08-17 LAB — CUP PACEART REMOTE DEVICE CHECK
Date Time Interrogation Session: 20190502234040
Implantable Pulse Generator Implant Date: 20180727

## 2017-08-24 ENCOUNTER — Encounter: Payer: Self-pay | Admitting: Family Medicine

## 2017-08-24 ENCOUNTER — Other Ambulatory Visit: Payer: Self-pay | Admitting: Family Medicine

## 2017-08-24 ENCOUNTER — Ambulatory Visit (INDEPENDENT_AMBULATORY_CARE_PROVIDER_SITE_OTHER): Payer: Self-pay | Admitting: Family Medicine

## 2017-08-24 VITALS — BP 130/88 | HR 90 | Temp 98.0°F | Ht 64.0 in | Wt 171.0 lb

## 2017-08-24 DIAGNOSIS — K219 Gastro-esophageal reflux disease without esophagitis: Secondary | ICD-10-CM

## 2017-08-24 DIAGNOSIS — Z09 Encounter for follow-up examination after completed treatment for conditions other than malignant neoplasm: Secondary | ICD-10-CM

## 2017-08-24 DIAGNOSIS — Z0000 Encounter for general adult medical examination without abnormal findings: Secondary | ICD-10-CM

## 2017-08-24 DIAGNOSIS — E119 Type 2 diabetes mellitus without complications: Secondary | ICD-10-CM

## 2017-08-24 LAB — POCT URINALYSIS DIP (MANUAL ENTRY)
Bilirubin, UA: NEGATIVE
Blood, UA: NEGATIVE
Glucose, UA: NEGATIVE mg/dL
Ketones, POC UA: NEGATIVE mg/dL
Leukocytes, UA: NEGATIVE
Nitrite, UA: NEGATIVE
Protein Ur, POC: NEGATIVE mg/dL
Spec Grav, UA: 1.015 (ref 1.010–1.025)
Urobilinogen, UA: 0.2 E.U./dL
pH, UA: 5.5 (ref 5.0–8.0)

## 2017-08-24 LAB — POCT GLYCOSYLATED HEMOGLOBIN (HGB A1C): Hemoglobin A1C: 7.6 % — AB (ref 4.0–5.6)

## 2017-08-24 MED FILL — TRUE METRIX TEST STRIP: 25 days supply | Qty: 100 | Fill #1

## 2017-08-24 MED FILL — $JANUVIA 25 MG TABLET: 25 | 30 days supply | Qty: 30 | Fill #1

## 2017-08-24 MED FILL — AMLODIPINE BESYLATE 5 MG TA: 5 | 30 days supply | Qty: 30 | Fill #10

## 2017-08-24 MED FILL — ?METFORMIN HCL 500MG TABLET: 500 | 30 days supply | Qty: 120 | Fill #1

## 2017-08-24 MED FILL — TRUEplus LANCETS 28G MISC: 25 days supply | Qty: 100 | Fill #0

## 2017-08-24 MED FILL — LISINOPRIL 20 MG TAB: 20 | 30 days supply | Qty: 45 | Fill #3

## 2017-08-24 MED FILL — PRAVASTATIN SODIUM 20 MG TA: 20 | 30 days supply | Qty: 30 | Fill #4

## 2017-08-24 NOTE — Progress Notes (Signed)
Subjective:    Patient ID: Caleb Hernandez, male    DOB: February 19, 1961, 57 y.o.   MRN: [id number]   PCP: Kathe Becton, NP  Chief Complaint  Patient presents with  . Follow-up    3 month on chronic condition   HPI [name] has a history of Diabetes-Type 2, Hypertension, Hypercholesterolemia, Hyperlipidemia, GI bleed, Stroke, and Allergic Rhinitis. He is he today for follow up of chronic conditions.   Current Status: He continues to have recurring acid reflux. He has a good appetite. He denies abdominal pain, nausea, vomiting, diarrhea, and constipation. He denies blood in stools, dysuria, and hematuria. He denies any other incidents of bleeding.   He denies fevers, chills, recent infections, fatigue, weight loss, and night sweats. He denies visual changes, headaches, dizziness, and falls.   Denies anxiety and depression.  He occasionally has generalized joint pain.    Past Medical History:  Diagnosis Date  . Abdominal pain 09/17/2016  . Alcohol use disorder 09/26/2016  . Allergic rhinitis 08/28/2012  . Bilateral bunions 12/18/2014  . Cerebellar stroke, acute ([place]) 10/05/2016  . 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 Corns and callosity 08/28/2012  . Diabetes mellitus without complication ([place])   . Diverticulitis 09/17/2016  . Early cataracts, bilateral 01/03/2014  . Encounter for long-term (current) use of other medications 08/28/2012  . Essential hypertension 09/17/2016  . First degree heart block 08/28/2012  . GI bleed 09/17/2016  . High cholesterol   . Hypercholesterolemia 04/13/2013  . Hypertension   . Increased band cell count 09/26/2016  . Male hypogonadism 11/16/2013  . Nondependent alcohol abuse 09/17/2016  . Onychomycosis of toenail 12/18/2014  . Positive RPR test 09/27/2016  . Premature beats 08/28/2012  . Proteinuria 04/13/2013  . 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/2013  . Tinea pedis 08/28/2012  . Tinea pedis of both feet 12/18/2014  . Tobacco  use 09/17/2016  . Type 2 diabetes mellitus, without long-term current use of insulin ([place]) 09/17/2016  . Undiagnosed cardiac murmurs 04/20/2012  . Vitamin D deficiency 04/13/2013   Current Status:  Family History  Problem Relation Age of Onset  . Heart disease Brother   . Diabetes Mother   . Hypertension Mother   . Diabetes Father   . Hypercalcemia Father   . Stomach cancer Brother   . AAA (abdominal aortic aneurysm) Brother   . Colon cancer Neg Hx     Social History   Socioeconomic History  . Marital status: Single    Spouse name: Not on file  . Number of children: Not on file  . Years of education: Not on file  . Highest education level: Not on file  Occupational History  . Not on file  Social Needs  . Financial resource strain: Not on file  . Food insecurity:    Worry: Not on file    Inability: Not on file  . Transportation needs:    Medical: Not on file    Non-medical: Not on file  Tobacco Use  . Smoking status: Former Smoker    Last attempt to quit: 09/28/2016    Years since quitting: 0.9  . Smokeless tobacco: Never Used  Substance and Sexual Activity  . Alcohol use: Yes    Comment: 40 oz per day/ former  . Drug use: No  . Sexual activity: Yes    Partners: Female  Lifestyle  . Physical activity:    Days per week: Not on file    Minutes per session:  Not on file  . Stress: Not on file  Relationships  . Social connections:    Talks on phone: Not on file    Gets together: Not on file    Attends religious service: Not on file    Active member of club or organization: Not on file    Attends meetings of clubs or organizations: Not on file    Relationship status: Not on file  . Intimate partner violence:    Fear of current or ex partner: Not on file    Emotionally abused: Not on file    Physically abused: Not on file    Forced sexual activity: Not on file  Other Topics Concern  . Not on file  Social History Narrative  . Not on file    Past Surgical  History:  Procedure Laterality Date  . BACK SURGERY    . LOOP RECORDER INSERTION N/A 10/17/2016   Procedure: Loop Recorder Insertion;  Surgeon: Evans Lance, MD;  Location: [facility];  Service: Cardiovascular;  Laterality: N/A;    Immunization History  Administered Date(s) Administered  . Influenza Split 01/19/2012  . Influenza, Seasonal, Injecte, Preservative Fre 01/11/2013  . Influenza,inj,Quad PF,6+ Mos 12/18/2014  . Tdap 02/17/2017    No Known Allergies  Current Meds  Medication Sig  . amLODipine (NORVASC) 5 MG tablet Take 1 tablet (5 mg total) by mouth every other day.  Marland Kitchen apixaban (ELIQUIS) 5 MG TABS tablet Take 1 tablet (5 mg total) by mouth 2 (two) times daily.  . blood glucose meter kit and supplies KIT Dispense based on patient and insurance preference. Use up to four times daily as directed. (FOR ICD-10. E.11.8).  . folic acid (FOLVITE) 1 MG tablet TAKE 1 TABLET (1 MG TOTAL) BY MOUTH DAILY.  Marland Kitchen gemfibrozil (LOPID) 600 MG tablet Take 1 tablet (600 mg total) by mouth 2 (two) times daily before a meal.  . insulin glargine (LANTUS) 100 UNIT/ML injection Inject 0.1 mLs (10 Units total) into the skin at bedtime.  . Insulin Pen Needle (NOVOFINE) 30G X 8 MM MISC Inject 10 each into the skin as needed.  . Insulin Syringe-Needle U-100 28G X 1/2" 1 ML MISC Use to administer insulin once daily. e11.8  . lisinopril (PRINIVIL,ZESTRIL) 20 MG tablet TAKE 1 AND 1/2 TABLETS BY MOUTH DAILY  . Magnesium 250 MG TABS Take 1 tablet by mouth daily.  . metFORMIN (GLUCOPHAGE) 500 MG tablet TAKE 2 TABLETS (1,000 MG TOTAL) 2 (TWO) TIMES DAILY WITH A MEAL BY MOUTH.  . Omega-3 Fatty Acids (FISH OIL) 600 MG CAPS Take 2 capsules by mouth 2 (two) times daily.  . pravastatin (PRAVACHOL) 20 MG tablet TAKE 1 TABLET (20 MG TOTAL) BY MOUTH DAILY.  . sitaGLIPtin (JANUVIA) 25 MG tablet Take 1 tablet (25 mg total) by mouth daily.  . Thiamine HCl (VITAMIN B-1) 250 MG tablet Take 250 mg by mouth daily.   . TRUE METRIX BLOOD GLUCOSE TEST test strip USE AS DIRECTED UP TO 4 TIMES DAILY  . TRUEPLUS LANCETS 30G MISC USE AS DIRECTED UP TO FOUR TIMES DAILY     Review of Systems  Constitutional: Negative.   HENT: Negative.   Eyes: Negative.   Respiratory: Negative.   Cardiovascular: Negative.   Gastrointestinal: Negative.   Endocrine: Negative.   Genitourinary: Negative.   Musculoskeletal: Positive for arthralgias (generalized).  Skin: Negative.   Allergic/Immunologic: Negative.   Neurological: Negative.   Hematological: Negative.   Psychiatric/Behavioral: Negative.    Objective:  Physical Exam  Constitutional: He is oriented to person, place, and time. He appears well-developed and well-nourished.  HENT:  Head: Normocephalic and atraumatic.  Right Ear: External ear normal.  Left Ear: External ear normal.  Nose: Nose normal.  Mouth/Throat: Oropharynx is clear and moist.  Eyes: Pupils are equal, round, and reactive to light. Conjunctivae and EOM are normal.  Neck: Normal range of motion. Neck supple.  Cardiovascular: Normal rate, regular rhythm, normal heart sounds and intact distal pulses.  Pulmonary/Chest: Effort normal and breath sounds normal.  Abdominal: Soft. Bowel sounds are normal.  Musculoskeletal: Normal range of motion.  Neurological: He is alert and oriented to person, place, and time.  Skin: Skin is warm and dry. Capillary refill takes less than 2 seconds.  Psychiatric: He has a normal mood and affect. His behavior is normal. Judgment and thought content normal.  Nursing note and vitals reviewed.  Assessment & Plan:   1. Type 2 diabetes mellitus without complication, without long-term current use of insulin (HCC) Hgb A1c has increased to 7.6 today, from 7.0 on 05/12/2017. Urinalysis is negative today. He will continue Insulin and Januvia as directed.  - POCT glycosylated hemoglobin (Hb A1C) - POCT urinalysis dipstick  2. Healthcare maintenance - Ambulatory  referral to Gastroenterology  3. Gastroesophageal reflux disease without esophagitis We will refer to GI for further assessment of Barrett's Esophogeal via Endoscopy. - Ambulatory referral to gastroenterology - pantoprazole (PROTONIX) 40 MG tablet; Take 1 tablet (40 mg total) by mouth daily.  Dispense: 30 tablet; Refill: 2  4. Follow up Follow up in 3 months.  Meds ordered this encounter  Medications  . pantoprazole (PROTONIX) 40 MG tablet    Sig: Take 1 tablet (40 mg total) by mouth daily.    Dispense:  30 tablet    Refill:  2   Kathe Becton,  MSN, FNP-BC Patient [place] [address] [place], [place] [address] [phone]

## 2017-08-25 ENCOUNTER — Encounter: Payer: No Typology Code available for payment source | Admitting: *Deleted

## 2017-08-25 ENCOUNTER — Encounter: Payer: Self-pay | Admitting: Cardiology

## 2017-08-27 MED ORDER — PANTOPRAZOLE SODIUM 40 MG PO TBEC
40.0000 mg | DELAYED_RELEASE_TABLET | Freq: Every day | ORAL | 2 refills | Status: DC
Start: 2017-08-27 — End: 2017-11-27

## 2017-08-28 MED FILL — ?PANTOPRAZOLE SO DR 40MG TA: 40 | 30 days supply | Qty: 30 | Fill #0

## 2017-08-31 ENCOUNTER — Telehealth: Payer: Self-pay

## 2017-08-31 MED FILL — ?FOLIC ACID 1 MG TABLET: 1 | 30 days supply | Qty: 30 | Fill #4

## 2017-08-31 NOTE — Telephone Encounter (Signed)
Patient notified

## 2017-08-31 NOTE — Telephone Encounter (Signed)
-----   Message from Azzie Glatter, [place] sent at 08/28/2017  6:02 PM EDT ----- Regarding: "RX for [employer],   Patient called this afternoon, (Friday 08/28/2017). Please call back and let him know that we filled his Rx on 08/27/2017. It was sent to [facility].   We also referred him back to GI for Endoscopy, so he should expect a call soon.   Thanks Morey Hummingbird!

## 2017-09-02 MED FILL — GEMFIBROZIL 600 MG TABLET: 600 | 30 days supply | Qty: 60 | Fill #3

## 2017-09-25 ENCOUNTER — Other Ambulatory Visit: Payer: Self-pay | Admitting: Family Medicine

## 2017-09-25 MED FILL — $JANUVIA 25 MG TABLET: 25 | 30 days supply | Qty: 30 | Fill #2

## 2017-09-25 MED FILL — AMLODIPINE BESYLATE 5 MG TA: 5 | 30 days supply | Qty: 30 | Fill #11

## 2017-09-25 MED FILL — PRAVASTATIN SODIUM 20 MG TA: 20 | 30 days supply | Qty: 30 | Fill #5

## 2017-09-25 MED FILL — ?METFORMIN HCL 500MG TABS: 500 | 30 days supply | Qty: 120 | Fill #2

## 2017-09-25 MED FILL — ?PANTOPRAZOLE SO DR 40MG TA: 40 | 30 days supply | Qty: 30 | Fill #1

## 2017-09-25 MED FILL — ?FOLIC ACID 1 MG TABLET: 1 | 30 days supply | Qty: 30 | Fill #5

## 2017-09-28 MED FILL — GEMFIBROZIL 600 MG TABS: 600 | 30 days supply | Qty: 60 | Fill #4

## 2017-09-28 MED FILL — LISINOPRIL 20 MG TAB: 20 | 30 days supply | Qty: 45 | Fill #0

## 2017-10-07 ENCOUNTER — Ambulatory Visit: Payer: No Typology Code available for payment source

## 2017-10-12 ENCOUNTER — Ambulatory Visit: Payer: No Typology Code available for payment source | Attending: Family Medicine

## 2017-10-13 ENCOUNTER — Telehealth: Payer: Self-pay | Admitting: Cardiology

## 2017-10-13 NOTE — Telephone Encounter (Signed)
LMOVM for pt to return call. I need to confirm if pt had loop recorder explanted and a PPM implanted at [facility] and if he plans to follow [facility] with [facility] or with our office.

## 2017-10-13 NOTE — Telephone Encounter (Signed)
Spoke w/ pt and he informed me that he no longer has the ILR. He now has a PPM and it is being followed by Dr. Ola Spurr.

## 2017-10-19 ENCOUNTER — Ambulatory Visit: Payer: Self-pay | Attending: Family Medicine

## 2017-10-27 ENCOUNTER — Other Ambulatory Visit: Payer: Self-pay | Admitting: Family Medicine

## 2017-10-27 MED FILL — FOLIC ACID 1 MG TABS: 1 | 30 days supply | Qty: 30 | Fill #0

## 2017-10-27 MED FILL — ?AMLODIPINE BESYLATE 5 MG T: 5 MG | 30 days supply | Qty: 30 | Fill #12

## 2017-10-27 MED FILL — PRAVASTATIN SODIUM 20 MG TA: 20 | 30 days supply | Qty: 30 | Fill #0

## 2017-10-27 MED FILL — ?PANTOPRAZOLE SO DR 40MG TA: 40 | 30 days supply | Qty: 30 | Fill #2

## 2017-10-27 MED FILL — ?METFORMIN HCL 500MG TABS: 500 | 30 days supply | Qty: 120 | Fill #3

## 2017-10-27 MED FILL — LISINOPRIL 20 MG TAB: 20 | 30 days supply | Qty: 45 | Fill #1

## 2017-10-28 DIAGNOSIS — I480 Paroxysmal atrial fibrillation: Secondary | ICD-10-CM | POA: Insufficient documentation

## 2017-10-28 DIAGNOSIS — Z950 Presence of cardiac pacemaker: Secondary | ICD-10-CM | POA: Insufficient documentation

## 2017-11-09 ENCOUNTER — Encounter: Payer: Self-pay | Admitting: Family Medicine

## 2017-11-09 ENCOUNTER — Ambulatory Visit (INDEPENDENT_AMBULATORY_CARE_PROVIDER_SITE_OTHER): Payer: Self-pay | Admitting: Family Medicine

## 2017-11-09 VITALS — BP 136/82 | HR 88 | Temp 98.0°F | Ht 64.0 in | Wt 169.0 lb

## 2017-11-09 DIAGNOSIS — E119 Type 2 diabetes mellitus without complications: Secondary | ICD-10-CM

## 2017-11-09 DIAGNOSIS — Z09 Encounter for follow-up examination after completed treatment for conditions other than malignant neoplasm: Secondary | ICD-10-CM

## 2017-11-09 DIAGNOSIS — Z23 Encounter for immunization: Secondary | ICD-10-CM

## 2017-11-09 DIAGNOSIS — I10 Essential (primary) hypertension: Secondary | ICD-10-CM

## 2017-11-09 DIAGNOSIS — I639 Cerebral infarction, unspecified: Secondary | ICD-10-CM

## 2017-11-09 LAB — POCT URINALYSIS DIP (MANUAL ENTRY)
Bilirubin, UA: NEGATIVE
Blood, UA: NEGATIVE
Glucose, UA: 250 mg/dL — AB
Ketones, POC UA: NEGATIVE mg/dL
Leukocytes, UA: NEGATIVE
Nitrite, UA: NEGATIVE
Protein Ur, POC: NEGATIVE mg/dL
Spec Grav, UA: 1.025 (ref 1.010–1.025)
Urobilinogen, UA: 0.2 E.U./dL
pH, UA: 5 (ref 5.0–8.0)

## 2017-11-09 LAB — POCT GLYCOSYLATED HEMOGLOBIN (HGB A1C): Hemoglobin A1C: 9.2 % — AB (ref 4.0–5.6)

## 2017-11-09 MED ORDER — INSULIN GLARGINE 100 UNIT/ML ~~LOC~~ SOLN: 20.0000 [IU] | Freq: Every day | SUBCUTANEOUS | 3 refills | Status: DC

## 2017-11-09 MED FILL — TRUEPLUS SYR 1ML 30GX5/16: 30G X 5/16" | 30 days supply | Qty: 100 | Fill #1

## 2017-11-09 MED FILL — TRUEPLUS SYR 1ML 30GX5/16": 30G X 5/16" | 30 days supply | Qty: 100 | Fill #1

## 2017-11-09 NOTE — Patient Instructions (Signed)
Insulin Glargine injection What is this medicine? INSULIN GLARGINE (IN su lin GLAR geen) is a human-made form of insulin. This drug lowers the amount of sugar in your blood. It is a long-acting insulin that is usually given once a day. This medicine may be used for other purposes; ask your health care provider or pharmacist if you have questions. COMMON BRAND NAME(S): BASAGLAR, Lantus, Lantus SoloStar, Toujeo SoloStar What should I tell my health care provider before I take this medicine? They need to know if you have any of these conditions: -episodes of low blood sugar -kidney disease -liver disease -an unusual or allergic reaction to insulin, metacresol, other medicines, foods, dyes, or preservatives -pregnant or trying to get pregnant -breast-feeding How should I use this medicine? This medicine is for injection under the skin. Use this medicine at the same time each day. Use exactly as directed. This insulin should never be mixed in the same syringe with other insulins before injection. Do not vigorously shake before use. You will be taught how to use this medicine and how to adjust doses for activities and illness. Do not use more insulin than prescribed. Always check the appearance of your insulin before using it. This medicine should be clear and colorless like water. Do not use it if it is cloudy, thickened, colored, or has solid particles in it. It is important that you put your used needles and syringes in a special sharps container. Do not put them in a trash can. If you do not have a sharps container, call your pharmacist or healthcare provider to get one. Talk to your pediatrician regarding the use of this medicine in children. Special care may be needed. Overdosage: If you think you have taken too much of this medicine contact a poison control center or emergency room at once. NOTE: This medicine is only for you. Do not share this medicine with others. What if I miss a dose? It is  important not to miss a dose. Your health care professional or doctor should discuss a plan for missed doses with you. If you do miss a dose, follow their plan. Do not take double doses. What may interact with this medicine? -other medicines for diabetes Many medications may cause changes in blood sugar, these include: -alcohol containing beverages -antiviral medicines for HIV or AIDS -aspirin and aspirin-like drugs -certain medicines for blood pressure, heart disease, irregular heart beat -chromium -diuretics -male hormones, such as estrogens or progestins, birth control pills -fenofibrate -gemfibrozil -isoniazid -lanreotide -male hormones or anabolic steroids -MAOIs like Carbex, Eldepryl, Marplan, Nardil, and Parnate -medicines for weight loss -medicines for allergies, asthma, cold, or cough -medicines for depression, anxiety, or psychotic disturbances -niacin -nicotine -NSAIDs, medicines for pain and inflammation, like ibuprofen or naproxen -octreotide -pasireotide -pentamidine -phenytoin -probenecid -quinolone antibiotics such as ciprofloxacin, levofloxacin, ofloxacin -some herbal dietary supplements -steroid medicines such as prednisone or cortisone -sulfamethoxazole; trimethoprim -thyroid hormones Some medications can hide the warning symptoms of low blood sugar (hypoglycemia). You may need to monitor your blood sugar more closely if you are taking one of these medications. These include: -beta-blockers, often used for high blood pressure or heart problems (examples include atenolol, metoprolol, propranolol) -clonidine -guanethidine -reserpine This list may not describe all possible interactions. Give your health care provider a list of all the medicines, herbs, non-prescription drugs, or dietary supplements you use. Also tell them if you smoke, drink alcohol, or use illegal drugs. Some items may interact with your medicine. What should I watch for   while using this  medicine? Visit your health care professional or doctor for regular checks on your progress. Do not drive, use machinery, or do anything that needs mental alertness until you know how this medicine affects you. Alcohol may interfere with the effect of this medicine. Avoid alcoholic drinks. A test called the HbA1C (A1C) will be monitored. This is a simple blood test. It measures your blood sugar control over the last 2 to 3 months. You will receive this test every 3 to 6 months. Learn how to check your blood sugar. Learn the symptoms of low and high blood sugar and how to manage them. Always carry a quick-source of sugar with you in case you have symptoms of low blood sugar. Examples include hard sugar candy or glucose tablets. Make sure others know that you can choke if you eat or drink when you develop serious symptoms of low blood sugar, such as seizures or unconsciousness. They must get medical help at once. Tell your doctor or health care professional if you have high blood sugar. You might need to change the dose of your medicine. If you are sick or exercising more than usual, you might need to change the dose of your medicine. Do not skip meals. Ask your doctor or health care professional if you should avoid alcohol. Many nonprescription cough and cold products contain sugar or alcohol. These can affect blood sugar. Make sure that you have the right kind of syringe for the type of insulin you use. Try not to change the brand and type of insulin or syringe unless your health care professional or doctor tells you to. Switching insulin brand or type can cause dangerously high or low blood sugar. Always keep an extra supply of insulin, syringes, and needles on hand. Use a syringe one time only. Throw away syringe and needle in a closed container to prevent accidental needle sticks. Insulin pens and cartridges should never be shared. Even if the needle is changed, sharing may result in passing of viruses  like hepatitis or HIV. Wear a medical ID bracelet or chain, and carry a card that describes your disease and details of your medicine and dosage times. What side effects may I notice from receiving this medicine? Side effects that you should report to your doctor or health care professional as soon as possible: -allergic reactions like skin rash, itching or hives, swelling of the face, lips, or tongue -breathing problems -signs and symptoms of high blood sugar such as dizziness, dry mouth, dry skin, fruity breath, nausea, stomach pain, increased hunger or thirst, increased urination -signs and symptoms of low blood sugar such as feeling anxious, confusion, dizziness, increased hunger, unusually weak or tired, sweating, shakiness, cold, irritable, headache, blurred vision, fast heartbeat, loss of consciousness Side effects that usually do not require medical attention (report to your doctor or health care professional if they continue or are bothersome): -increase or decrease in fatty tissue under the skin due to overuse of a particular injection site -itching, burning, swelling, or rash at site where injected This list may not describe all possible side effects. Call your doctor for medical advice about side effects. You may report side effects to FDA at 1-800-FDA-1088. Where should I keep my medicine? Keep out of the reach of children. Store unopened vials in a refrigerator between 2 and 8 degrees C (36 and 46 degrees F). Do not freeze or use if the insulin has been frozen. Opened vials (vials currently in use) may be stored   in the refrigerator or at room temperature, at approximately 25 degrees C (77 degrees F) or cooler. Keeping your insulin at room temperature decreases the amount of pain during injection. Once opened, your insulin can be used for 28 days. After 28 days, the vial should be thrown away. Store Lantus Solostar Pens or Basaglar KwikPens in a refrigerator between 2 and 8 degrees C (36  and 46 degrees F) or at room temperature below 30 degrees C (86 degrees F). Do not freeze or use if the insulin has been frozen. Once opened, the pens should be kept at room temperature. Do not store in the refrigerator once opened. Once opened, the insulin can be used for 28 days. After 28 days, the Lantus Solostar Pen or Basaglar KwikPen should be thrown away. Store Toujeo Solostar Pens in a refrigerator between 2 and 8 degrees C (36 and 46 degrees F). Do not freeze or use if the insulin has been frozen. Once opened, the pens should be kept at room temperature below 30 degrees C (86 degrees F). Do not store in the refrigerator once opened. Once opened, the insulin can be used for 42 days. After 42 days, the Toujeo Solostar Pen should be thrown away. Protect from light and excessive heat. Throw away any unused medicine after the expiration date or after the specified time for room temperature storage has passed. NOTE: This sheet is a summary. It may not cover all possible information. If you have questions about this medicine, talk to your doctor, pharmacist, or health care provider.  2018 Elsevier/Gold Standard (2016-03-26 10:26:25)  

## 2017-11-09 NOTE — Progress Notes (Signed)
Follow Up   Subjective:    Patient ID: Caleb Hernandez, male    DOB: 29-Jun-1960, 57 y.o.   MRN: [id number]   Chief Complaint  Patient presents with  . Follow-up    diabetes    HPI Mr. Uhlig has a past medical history of Vitamin D deficiency, Diabetes, Tobacco Use, Tinea Pedis, Hypertension, Hypercholesterolemia, GI Bleed, First Degree Heart Block, Cataracts, Diverticulitis, Alcohol Disorder and Abdominal Pain. He is here for follow up today.   Current Status: Since his last office visit, He is doing well with no complaints. He arrives to office today with the use of a cane to assist in ambulation. He states that he continues to have mild fatigue/weakness and shortness of breath on exertion. He continues to smoke 4-5 cigarettes daily.   He denies fevers, chills, recent infections, weight loss, and night sweats.   He has not had any headaches, visual changes, dizziness, and falls. No chest pain, heart palpitations, and cough reported.   No reports of GI problems such as nausea, vomiting, diarrhea, and constipation. He has no reports of blood in stools, dysuria and hematuria.   No depression or anxiety reported.   He denies pain today.   Review of Systems  Constitutional: Positive for fatigue (mild).  HENT: Negative.   Eyes: Negative.   Respiratory: Positive for shortness of breath (on exertion).   Cardiovascular: Negative.   Gastrointestinal: Positive for abdominal distention.  Endocrine: Negative.   Genitourinary: Negative.   Musculoskeletal: Negative.   Skin: Negative.   Allergic/Immunologic: Negative.   Neurological: Positive for weakness (moderate weakness, r/t hx of stroke).  Hematological: Negative.   Psychiatric/Behavioral: Negative.    Objective:   Physical Exam  Constitutional: He is oriented to person, place, and time. He appears well-developed and well-nourished.  HENT:  Head: Normocephalic and atraumatic.  Right Ear: External ear normal.  Left Ear:  External ear normal.  Nose: Nose normal.  Mouth/Throat: Oropharynx is clear and moist.  Eyes: Pupils are equal, round, and reactive to light. Conjunctivae and EOM are normal.  Neck: Normal range of motion. Neck supple.  Cardiovascular: Normal rate, regular rhythm, normal heart sounds and intact distal pulses.  Pulmonary/Chest: Effort normal and breath sounds normal.  Abdominal: Soft. Bowel sounds are normal.  Musculoskeletal: Normal range of motion.  Uses cane for ambulation. R/t residual weakness from hx of stroke.   Neurological: He is alert and oriented to person, place, and time.  Skin: Skin is warm and dry. Capillary refill takes less than 2 seconds.  Psychiatric: He has a normal mood and affect. His behavior is normal. Judgment and thought content normal.  Nursing note and vitals reviewed.  Assessment & Plan:    1. Type 2 diabetes mellitus without complication, without long-term current use of insulin (HCC) Hgb A1c is elevated at 9.2 today, from 7.6 on 6.3. We will increase Lantus to 20 units QHS today. He will continue Lantus, Metformin and Januvia as prescribed.   Patient will monitor blood glucose levels prior to injection. He will continue to decrease foods/beverages high in sugars and carbs and follow Heart Healthy or DASH diet. Increase physical activity to at least 30 minutes cardio exercise daily.   - POCT glycosylated hemoglobin (Hb A1C) - POCT urinalysis dipstick  2. Need for immunization against influenza Injection administered in office today.  - Flu Vaccine QUAD 36+ mos IM  3. Essential hypertension Blood pressure is stable at 136/82 today. He will continue Lisinopril as prescribed. He will continue to  decrease high sodium intake, excessive alcohol intake, increase potassium intake, smoking cessation, and increase physical activity of at least 30 minutes of cardio activity daily. He will continue to follow Heart Healthy or DASH diet.   4. Cerebellar stroke, acute  ([place]) Stable. He has moderate residual weakness, which he uses cane for ambulation.   5. Follow up He will follow up in 1 month.   Meds ordered this encounter  Medications  . insulin glargine (LANTUS) 100 UNIT/ML injection    Sig: Inject 0.2 mLs (20 Units total) into the skin at bedtime.    Dispense:  40 mL    Refill:  3    Please provide education on use   Caleb Becton,  MSN, [facility] Patient [place] [address] [place], [place] [address] [phone]

## 2017-11-10 MED FILL — $LANTUS 100 UNITS/ML VIAL: 100 | 28 days supply | Qty: 10 | Fill #0

## 2017-11-11 MED FILL — GEMFIBROZIL 600 MG TABLET: 600 | 30 days supply | Qty: 60 | Fill #5 | Status: TO

## 2017-11-24 ENCOUNTER — Other Ambulatory Visit: Payer: Self-pay | Admitting: Family Medicine

## 2017-11-24 DIAGNOSIS — K219 Gastro-esophageal reflux disease without esophagitis: Secondary | ICD-10-CM

## 2017-11-24 MED FILL — ?METFORMIN HCL 500MG TABS: 500 | 30 days supply | Qty: 120 | Fill #4

## 2017-11-24 MED FILL — FOLIC ACID 1 MG TABS: 1 | 30 days supply | Qty: 30 | Fill #1

## 2017-11-24 MED FILL — PRAVASTATIN SODIUM 20 MG TA: 20 | 30 days supply | Qty: 30 | Fill #1

## 2017-11-24 MED FILL — LISINOPRIL 20 MG TAB: 20 | 30 days supply | Qty: 45 | Fill #2

## 2017-11-26 ENCOUNTER — Telehealth: Payer: Self-pay

## 2017-11-26 DIAGNOSIS — I639 Cerebral infarction, unspecified: Secondary | ICD-10-CM

## 2017-11-26 DIAGNOSIS — K219 Gastro-esophageal reflux disease without esophagitis: Secondary | ICD-10-CM

## 2017-11-26 DIAGNOSIS — I10 Essential (primary) hypertension: Secondary | ICD-10-CM

## 2017-11-26 MED FILL — ?AMLODIPINE BESYLATE 5 MG T: 5 MG | 30 days supply | Qty: 30 | Fill #0

## 2017-11-27 ENCOUNTER — Other Ambulatory Visit: Payer: Self-pay | Admitting: Family Medicine

## 2017-11-27 DIAGNOSIS — K219 Gastro-esophageal reflux disease without esophagitis: Secondary | ICD-10-CM

## 2017-11-27 MED ORDER — AMLODIPINE BESYLATE 5 MG PO TABS: 5.0000 mg | ORAL_TABLET | ORAL | 2 refills | Status: DC

## 2017-11-27 MED ORDER — PANTOPRAZOLE SODIUM 40 MG PO TBEC: 40.0000 mg | DELAYED_RELEASE_TABLET | Freq: Every day | ORAL | 2 refills | Status: DC

## 2017-11-27 MED FILL — ?PANTOPRAZOLE SO DR 40MG TA: 40 | 30 days supply | Qty: 30 | Fill #0

## 2017-11-27 NOTE — Telephone Encounter (Signed)
Medication sent to pharmacy  

## 2017-12-07 MED FILL — GEMFIBROZIL 600 MG TAB: 600 | 30 days supply | Qty: 60 | Fill #0

## 2017-12-14 ENCOUNTER — Ambulatory Visit (INDEPENDENT_AMBULATORY_CARE_PROVIDER_SITE_OTHER): Payer: Self-pay | Admitting: Family Medicine

## 2017-12-14 ENCOUNTER — Encounter: Payer: Self-pay | Admitting: Family Medicine

## 2017-12-14 VITALS — BP 134/90 | HR 100 | Temp 98.7°F | Ht 64.0 in | Wt 174.0 lb

## 2017-12-14 DIAGNOSIS — Z720 Tobacco use: Secondary | ICD-10-CM

## 2017-12-14 DIAGNOSIS — Z09 Encounter for follow-up examination after completed treatment for conditions other than malignant neoplasm: Secondary | ICD-10-CM

## 2017-12-14 DIAGNOSIS — E119 Type 2 diabetes mellitus without complications: Secondary | ICD-10-CM

## 2017-12-14 DIAGNOSIS — I10 Essential (primary) hypertension: Secondary | ICD-10-CM

## 2017-12-14 DIAGNOSIS — Z716 Tobacco abuse counseling: Secondary | ICD-10-CM

## 2017-12-14 DIAGNOSIS — Z8673 Personal history of transient ischemic attack (TIA), and cerebral infarction without residual deficits: Secondary | ICD-10-CM

## 2017-12-14 LAB — POCT URINALYSIS DIP (MANUAL ENTRY)
Bilirubin, UA: NEGATIVE
Blood, UA: NEGATIVE
Glucose, UA: NEGATIVE mg/dL
Ketones, POC UA: NEGATIVE mg/dL
Leukocytes, UA: NEGATIVE
Nitrite, UA: NEGATIVE
Protein Ur, POC: 30 mg/dL — AB
Spec Grav, UA: 1.02 (ref 1.010–1.025)
Urobilinogen, UA: 0.2 E.U./dL
pH, UA: 6.5 (ref 5.0–8.0)

## 2017-12-14 LAB — POCT GLYCOSYLATED HEMOGLOBIN (HGB A1C): Hemoglobin A1C: 9 % — AB (ref 4.0–5.6)

## 2017-12-14 MED ORDER — SITAGLIPTIN PHOSPHATE 25 MG PO TABS: 25.0000 mg | ORAL_TABLET | Freq: Every day | ORAL | 3 refills | Status: DC

## 2017-12-14 NOTE — Progress Notes (Signed)
Follow Up  Subjective:    Patient ID: Caleb Hernandez, male    DOB: 02-11-1961, 57 y.o.   MRN: [id number]   Chief Complaint  Patient presents with  . Follow-up    HPI  Mr. Umstead is a 57 year male with a past medical history of Vitamin D Def, Diabetes, Tobacco Use, Proteinuria, Hypertension, Hyperlipidemia, GI Bleed, Cataracts, Cataracts, Diverticulitis, Allergic Rhinitis, and Alcohol Use. He is here today for Hospital Follow Up.   Current Status: Since his last office visit, he has had an ED visit 12/13/2017 for nausea and vomiting. He was found to have low magnesium, and was discharged with instructioned to increase Magnesium from 250 mg daily to 400 mg daily X 5 days.   Smokes 3 cigarettes daily. Alcohol [place] is 1 beer 4-5 times weekly.  He uses cane for assistance with ambulation for residual left sided weakness.   He reports occasional nausea and vomiting. He denies visual changes, chest pain, heart palpitations, and falls. He has occasionally headaches and dizziness with position changes. Denies severe headaches, confusion, seizures, double vision, and blurred vision.  He denies increased thirst, frequent urination, hunger, fatigue, blurred vision, excessive hunger, excessive thirst, weight gain, weight loss, and poor wound healing.   He denies fevers, chills, fatigue, recent infections, weight loss, and night sweats. He has not had any headaches, and falls. No chest pain, heart palpitations, cough and shortness of breath reported. No reports of GI problems such as nausea, vomiting, diarrhea, and constipation. He has no reports of blood in stools, dysuria and hematuria. No depression or anxiety reported. He denies pain today.   Review of Systems  Constitutional: Negative.   HENT: Negative.   Respiratory: Negative.   Cardiovascular: Negative.   Gastrointestinal: Positive for abdominal distention (Obese), diarrhea and nausea.  Endocrine: Negative.   Genitourinary: Negative.    Musculoskeletal: Positive for arthralgias (generalized).  Skin: Negative.   Allergic/Immunologic: Negative.   Neurological: Positive for dizziness (occasional) and headaches (Occassional ).  Hematological: Negative.   Psychiatric/Behavioral: Negative.    Objective:   Physical Exam  Constitutional: He is oriented to person, place, and time. He appears well-developed and well-nourished.  Cardiovascular: Normal rate, regular rhythm, normal heart sounds and intact distal pulses.  Pulmonary/Chest: Effort normal and breath sounds normal.  Abdominal: Soft. Bowel sounds are normal.  Neurological: He is alert and oriented to person, place, and time.  Skin: Skin is warm and dry.  Psychiatric: He has a normal mood and affect. His behavior is normal. Judgment and thought content normal.   Assessment & Plan:   1. Hospital discharge follow-up       2. Type 2 diabetes mellitus without complication, without long-term current use of insulin ([place]) Rx for Januvia faxed to drug company for home delivery of medication. Hgb A1c stable at 9.0 today.  She will continue to decrease foods/beverages high in sugars and carbs and follow Heart Healthy or DASH diet. Increase physical activity to at least 30 minutes cardio exercise daily.  - POCT urinalysis dipstick - POCT glycosylated hemoglobin (Hb A1C) - sitaGLIPtin (JANUVIA) 25 MG tablet; Take 1 tablet (25 mg total) by mouth daily.  Dispense: 90 tablet; Refill: 3  3. Comprehensive diabetic foot examination, type 2 DM, encounter for [facility]) Diabetic foot exam negative for decreased sensitivity. Patient reminded to check feet often. We will reassess in 1 year.   4. Essential hypertension Blood pressure is stable at 134/90 today. He will continue Amlodipine and Lisinopril as prescribed.  5. S/P stroke due to cerebrovascular disease He is doing well. He will continue to use cane to assist in ambulation,for lef-sided weakness.  6. Hypomagnesemia He will return  in 1 week to re-assess Magnesium level. He will continue Mag 250 mg daily X 5 days as prescribed.   7. Tobacco use He has decrease his smoking habit significantly.   8. Encounter for smoking cessation counseling He will contact office when he is ready to quit smoking recommendations and treatment.   9. Follow up He will follow up in 3 month for assessment and management of chronic diseases.   Meds ordered this encounter  Medications  . sitaGLIPtin (JANUVIA) 25 MG tablet    Sig: Take 1 tablet (25 mg total) by mouth daily.    Dispense:  90 tablet    Refill:  [place],  MSN, [facility] Patient [employer] [address] [place], [place] [address] [phone]

## 2017-12-21 ENCOUNTER — Other Ambulatory Visit: Payer: Self-pay

## 2017-12-21 NOTE — Progress Notes (Signed)
Order in for Magnesium lab draw today.

## 2017-12-22 LAB — MAGNESIUM: Magnesium: 1.7 mg/dL (ref 1.6–2.3)

## 2017-12-23 ENCOUNTER — Telehealth: Payer: Self-pay

## 2017-12-23 NOTE — Telephone Encounter (Signed)
Patient notified

## 2017-12-23 NOTE — Telephone Encounter (Signed)
-----   Message from Azzie Glatter, [place] sent at 12/22/2017  7:11 PM EDT ----- Regarding: "Lab Results" Inform patient that Magnesium level is at normal levels. He is to continue Magnesium supplement as prescribed. He will follow up as scheduled.   Thank you.

## 2017-12-25 MED FILL — PRAVASTATIN SODIUM 20 MG TA: 20 | 30 days supply | Qty: 30 | Fill #2

## 2017-12-25 MED FILL — ?METFORMIN HCL 500MG TABS: 500 | 30 days supply | Qty: 120 | Fill #5

## 2017-12-25 MED FILL — ?AMLODIPINE BESYLATE 5 MG T: 5 MG | 30 days supply | Qty: 30 | Fill #1

## 2017-12-25 MED FILL — LISINOPRIL 20 MG TAB: 20 | 30 days supply | Qty: 45 | Fill #3

## 2017-12-25 MED FILL — ?PANTOPRAZOLE SO DR 40MG TA: 40 | 30 days supply | Qty: 30 | Fill #1

## 2017-12-25 MED FILL — FOLIC ACID 1 MG TABS: 1 | 30 days supply | Qty: 30 | Fill #2

## 2018-01-22 ENCOUNTER — Other Ambulatory Visit: Payer: Self-pay | Admitting: Internal Medicine

## 2018-01-22 ENCOUNTER — Other Ambulatory Visit: Payer: Self-pay | Admitting: Family Medicine

## 2018-01-22 ENCOUNTER — Other Ambulatory Visit: Payer: Self-pay

## 2018-01-22 MED ORDER — METFORMIN HCL 500 MG PO TABS: 1000.0000 mg | ORAL_TABLET | Freq: Two times a day (BID) | ORAL | 1 refills | Status: DC

## 2018-01-22 MED FILL — PRAVASTATIN SODIUM 20 MG TA: 20 | 30 days supply | Qty: 30 | Fill #3

## 2018-01-22 MED FILL — FOLIC ACID 1 MG TABS: 1 | 30 days supply | Qty: 30 | Fill #3

## 2018-01-22 MED FILL — GEMFIBROZIL 600 MG TAB: 600 | 30 days supply | Qty: 60 | Fill #0

## 2018-01-22 MED FILL — ?METFORMIN HCL 500MG TABS: 500 | 30 days supply | Qty: 120 | Fill #0

## 2018-01-22 MED FILL — ?PANTOPRAZOLE SOD DR 40MG T: 40 | 30 days supply | Qty: 30 | Fill #2

## 2018-01-22 NOTE — Telephone Encounter (Signed)
Medication sent to pharmacy  

## 2018-01-25 ENCOUNTER — Other Ambulatory Visit: Payer: Self-pay | Admitting: Family Medicine

## 2018-01-25 MED FILL — TRUE METRIX TEST STRIP: 25 days supply | Qty: 100 | Fill #2

## 2018-01-25 MED FILL — $LANTUS 100 UNITS/ML VIAL: 100 | 28 days supply | Qty: 10 | Fill #1

## 2018-01-25 MED FILL — ?AMLODIPINE BESYLATE 5 MG T: 5 MG | 30 days supply | Qty: 30 | Fill #2

## 2018-01-25 MED FILL — $JANUVIA 25 MG TABLET: 25 | 90 days supply | Qty: 90 | Fill #3

## 2018-01-26 ENCOUNTER — Other Ambulatory Visit: Payer: Self-pay | Admitting: Family Medicine

## 2018-01-26 MED FILL — LISINOPRIL 20 MG TAB: 20 | 30 days supply | Qty: 45 | Fill #0

## 2018-02-12 MED FILL — TRUEPLUS SYR 1ML 30GX5/16: 30G X 5/16" | 30 days supply | Qty: 100 | Fill #2

## 2018-02-12 MED FILL — TRUEPLUS SYR 1ML 30GX5/16": 30G X 5/16" | 30 days supply | Qty: 100 | Fill #2

## 2018-02-24 ENCOUNTER — Other Ambulatory Visit: Payer: Self-pay | Admitting: Family Medicine

## 2018-02-24 ENCOUNTER — Other Ambulatory Visit: Payer: Self-pay

## 2018-02-24 DIAGNOSIS — K219 Gastro-esophageal reflux disease without esophagitis: Secondary | ICD-10-CM

## 2018-02-24 MED ORDER — APIXABAN 5 MG PO TABS: 5.0000 mg | ORAL_TABLET | Freq: Two times a day (BID) | ORAL | 0 refills | Status: DC

## 2018-02-24 MED FILL — ?AMLODIPINE BESYLATE 5 MG T: 5 MG | 30 days supply | Qty: 30 | Fill #3

## 2018-02-24 MED FILL — GEMFIBROZIL 600 MG TAB: 600 | 30 days supply | Qty: 60 | Fill #1

## 2018-02-24 MED FILL — ?PRAVASTATIN SODIUM 20MG TA: 20 | 30 days supply | Qty: 30 | Fill #4

## 2018-02-24 MED FILL — LISINOPRIL 20 MG TAB: 20 | 30 days supply | Qty: 45 | Fill #1

## 2018-02-24 MED FILL — ?METFORMIN HCL 500MG TABS: 500 | 30 days supply | Qty: 120 | Fill #1

## 2018-02-24 MED FILL — FOLIC ACID 1 MG TABS: 1 | 30 days supply | Qty: 30 | Fill #4

## 2018-02-24 MED FILL — ?METFORMIN HCL 500MG TABL: 500 | 30 days supply | Qty: 120 | Fill #1

## 2018-02-24 NOTE — Telephone Encounter (Signed)
Patient schedule for 3 month follow up in 3 weeks.

## 2018-02-25 MED FILL — ELIQUIS 5 MG TABLET: 5 | 30 days supply | Qty: 60 | Fill #0

## 2018-02-26 ENCOUNTER — Other Ambulatory Visit: Payer: Self-pay | Admitting: Family Medicine

## 2018-02-26 DIAGNOSIS — K219 Gastro-esophageal reflux disease without esophagitis: Secondary | ICD-10-CM

## 2018-03-01 ENCOUNTER — Other Ambulatory Visit: Payer: Self-pay

## 2018-03-01 ENCOUNTER — Other Ambulatory Visit: Payer: Self-pay | Admitting: Family Medicine

## 2018-03-01 DIAGNOSIS — K219 Gastro-esophageal reflux disease without esophagitis: Secondary | ICD-10-CM

## 2018-03-01 MED ORDER — PANTOPRAZOLE SODIUM 40 MG PO TBEC: 40.0000 mg | DELAYED_RELEASE_TABLET | Freq: Every day | ORAL | 2 refills | Status: DC

## 2018-03-01 MED FILL — ?PANTOPRAZOLE SO DR 40MG TA: 40 | 30 days supply | Qty: 30 | Fill #0

## 2018-03-08 MED FILL — $LANTUS 100 UNITS/ML VIAL: 100 | 28 days supply | Qty: 10 | Fill #2

## 2018-03-22 ENCOUNTER — Ambulatory Visit (INDEPENDENT_AMBULATORY_CARE_PROVIDER_SITE_OTHER): Payer: Self-pay | Admitting: Family Medicine

## 2018-03-22 ENCOUNTER — Encounter: Payer: Self-pay | Admitting: Family Medicine

## 2018-03-22 VITALS — BP 140/92 | HR 88 | Temp 98.2°F | Ht 64.0 in | Wt 179.0 lb

## 2018-03-22 DIAGNOSIS — Z09 Encounter for follow-up examination after completed treatment for conditions other than malignant neoplasm: Secondary | ICD-10-CM

## 2018-03-22 DIAGNOSIS — E119 Type 2 diabetes mellitus without complications: Secondary | ICD-10-CM

## 2018-03-22 DIAGNOSIS — I10 Essential (primary) hypertension: Secondary | ICD-10-CM

## 2018-03-22 DIAGNOSIS — Z8673 Personal history of transient ischemic attack (TIA), and cerebral infarction without residual deficits: Secondary | ICD-10-CM

## 2018-03-22 LAB — POCT URINALYSIS DIP (MANUAL ENTRY)
Bilirubin, UA: NEGATIVE
Blood, UA: NEGATIVE
Glucose, UA: 100 mg/dL — AB
Ketones, POC UA: NEGATIVE mg/dL
Leukocytes, UA: NEGATIVE
Nitrite, UA: NEGATIVE
Protein Ur, POC: NEGATIVE mg/dL
Spec Grav, UA: 1.015 (ref 1.010–1.025)
Urobilinogen, UA: 0.2 E.U./dL
pH, UA: 5.5 (ref 5.0–8.0)

## 2018-03-22 LAB — POCT GLYCOSYLATED HEMOGLOBIN (HGB A1C): Hemoglobin A1C: 10.1 % — AB (ref 4.0–5.6)

## 2018-03-22 NOTE — Progress Notes (Signed)
Follow Up  Subjective:    Patient ID: Caleb Hernandez, male    DOB: 1960-06-26, 57 y.o.   MRN: [id number]  Chief Complaint  Patient presents with  . Follow-up    chronic condition   . joint pain   HPI  Caleb Hernandez is a 57 year old male with a past medical history of Vitamin D Deficiency, Murmur, Diabetes, Tobacco Use, Tinea Pedis, Hypertension, Hyperlipidemia, GI Bleed, Diverticulosis, Allergic Rhinitis, Alcohol Use, and Abdominal Pain.  Current Status: Since his last office visit, he is doing well with no complaints. He continues to have residual left sided weakness, which he uses a cane to assess in ambulation. He is s/p: Stroke over 1 year now. He continues to have joint pain, especially at night. He denies chest pain, cough, shortness of breath, heart palpitations, and falls. He has occasionally headaches and dizziness with position changes. Denies severe headaches, confusion, seizures, double vision, and blurred vision. He continues to check blood glucose daily which usually ranges from 150-181. He takes all medications as prescribed. He denies fatigue, frequent urination, excessive hunger, excessive thirst, weight gain, weight loss, and poor wound healing. He is currently smoking 5 cigarettes daily. He drinks 1-2 beers weekly. He denies cough and shortness of breath today.   He denies fevers, chills, recent infections, weight loss, and night sweats. No reports of GI problems such as diarrhea, and constipation. He has no reports of blood in stools, dysuria and hematuria. No depression or anxiety reported.   Review of Systems  Constitutional: Negative.   HENT: Negative.   Eyes: Negative.   Respiratory: Negative.   Cardiovascular: Negative.   Gastrointestinal: Negative.   Endocrine: Negative.   Genitourinary: Negative.   Musculoskeletal: Positive for arthralgias (Generalized joint pain).  Skin: Negative.   Allergic/Immunologic: Negative.   Neurological: Positive for dizziness and  headaches.  Hematological: Negative.   Psychiatric/Behavioral: Negative.    Objective:   Physical Exam Vitals signs and nursing note reviewed.  Constitutional:      Appearance: Normal appearance. He is normal weight.  HENT:     Head: Normocephalic and atraumatic.     Right Ear: Tympanic membrane, ear canal and external ear normal.     Left Ear: Tympanic membrane, ear canal and external ear normal.     Nose: Nose normal.     Mouth/Throat:     Mouth: Mucous membranes are moist.     Pharynx: Oropharynx is clear.  Eyes:     Extraocular Movements: Extraocular movements intact.     Conjunctiva/sclera: Conjunctivae normal.     Pupils: Pupils are equal, round, and reactive to light.  Neck:     Musculoskeletal: Normal range of motion and neck supple.  Cardiovascular:     Rate and Rhythm: Normal rate and regular rhythm.     Pulses: Normal pulses.     Heart sounds: Normal heart sounds.  Pulmonary:     Effort: Pulmonary effort is normal.     Breath sounds: Normal breath sounds.  Abdominal:     General: Bowel sounds are normal.     Palpations: Abdomen is soft.  Musculoskeletal: Normal range of motion.  Skin:    General: Skin is warm and dry.     Capillary Refill: Capillary refill takes less than 2 seconds.  Neurological:     General: No focal deficit present.     Mental Status: He is alert and oriented to person, place, and time.  Psychiatric:        Mood  and Affect: Mood normal.        Behavior: Behavior normal.        Thought Content: Thought content normal.        Judgment: Judgment normal.    Assessment & Plan:   1. Type 2 diabetes mellitus without complication, without long-term current use of insulin (HCC) Hgb A1c increased today at 10.1, from 9.0 on 11/09/2017. He will continue anti-diabetic medications as directed. He will continue to decrease foods/beverages high in sugars and carbs and follow Heart Healthy or DASH diet. Increase physical activity to at least 30 minutes  cardio exercise daily.  - POCT glycosylated hemoglobin (Hb A1C) - POCT urinalysis dipstick  2. Essential hypertension Blood pressure is at 144/88 today. He will continue Amlodipine as prescribed. He will continue to decrease high sodium intake, excessive alcohol intake, increase potassium intake, smoking cessation, and increase physical activity of at least 30 minutes of cardio activity daily. He will continue to follow Heart Healthy or DASH diet.  3. S/P stroke due to cerebrovascular disease Stable. He is doing well. No signs and symptoms of recurrence noted or reported. He continues to have residual left sided weakness. He will continue to use cane upon ambulation. We will continue to monitor.   4. Hypomagnesemia Patient has history of Hypomagnesemia. Magnesium level at normal levels at 1.7 on 12/21/2017. We will re-check labs including Magnesium at next office visit.  5. Follow up He will follow up in 3 months.   No orders of the defined types were placed in this encounter.  Kathe Becton,  MSN, FNP-C Patient [facility] [address] [place], [place] [address] [phone]

## 2018-03-29 MED FILL — ?METFORMIN HCL 500MG TABLET: 500 | 30 days supply | Qty: 120 | Fill #2

## 2018-03-29 MED FILL — LISINOPRIL 20 MG TAB: 20 | 30 days supply | Qty: 45 | Fill #2

## 2018-03-29 MED FILL — !ELIQUIS 5MG TABLET: 5 | 30 days supply | Qty: 60 | Fill #1

## 2018-03-29 MED FILL — ?PRAVASTATIN SODIUM 20MG TA: 20 | 30 days supply | Qty: 30 | Fill #5

## 2018-03-29 MED FILL — GEMFIBROZIL 600 MG TAB: 600 | 30 days supply | Qty: 60 | Fill #2

## 2018-03-29 MED FILL — FOLIC ACID 1 MG TABS: 1 | 30 days supply | Qty: 30 | Fill #5

## 2018-03-29 MED FILL — ?AMLODIPINE BESYLATE 5 MG T: 5 MG | 30 days supply | Qty: 30 | Fill #4

## 2018-04-12 MED FILL — !LANTUS 100 UNITS/ML VIAL: 100 | 28 days supply | Qty: 10 | Fill #3

## 2018-04-26 MED FILL — ELIQUIS 5 MG TABLET: 5 | 30 days supply | Qty: 60 | Fill #2

## 2018-04-26 MED FILL — GEMFIBROZIL 600 MG TAB: 600 | 30 days supply | Qty: 60 | Fill #3

## 2018-04-26 MED FILL — !ELIQUIS 5MG TABLET: 5 | 30 days supply | Qty: 60 | Fill #2

## 2018-04-26 MED FILL — AMLODIPINE BESYLATE 5 MG TA: 5 | 30 days supply | Qty: 30 | Fill #5

## 2018-04-26 MED FILL — LISINOPRIL 20 MG TAB: 20 | 30 days supply | Qty: 45 | Fill #3

## 2018-04-26 MED FILL — ?PANTOPRAZOLE SOD DR 40MG T: 40 | 30 days supply | Qty: 30 | Fill #1

## 2018-04-28 ENCOUNTER — Telehealth: Payer: Self-pay

## 2018-04-28 DIAGNOSIS — E119 Type 2 diabetes mellitus without complications: Secondary | ICD-10-CM

## 2018-04-28 MED ORDER — FOLIC ACID 1 MG PO TABS: 1.0000 mg | ORAL_TABLET | Freq: Every day | ORAL | 1 refills | Status: DC

## 2018-04-28 MED ORDER — SITAGLIPTIN PHOSPHATE 25 MG PO TABS: 25.0000 mg | ORAL_TABLET | Freq: Every day | ORAL | 3 refills | Status: DC

## 2018-04-28 MED ORDER — METFORMIN HCL 500 MG PO TABS: 1000.0000 mg | ORAL_TABLET | Freq: Two times a day (BID) | ORAL | 1 refills | Status: DC

## 2018-04-28 MED ORDER — PRAVASTATIN SODIUM 20 MG PO TABS: 20.0000 mg | ORAL_TABLET | Freq: Every day | ORAL | 1 refills | Status: DC

## 2018-04-28 MED FILL — ?METFORMIN HCL 500MG TABLET: 500 | 30 days supply | Qty: 120 | Fill #0

## 2018-04-28 MED FILL — !JANUVIA 25 MG TABLET: 25 | 30 days supply | Qty: 30 | Fill #0

## 2018-04-28 MED FILL — FOLIC ACID 1 MG TABS: 1 | 30 days supply | Qty: 30 | Fill #0

## 2018-04-28 MED FILL — ?PRAVASTATIN SODIUM 20MG TA: 20 | 30 days supply | Qty: 30 | Fill #0

## 2018-04-28 NOTE — Telephone Encounter (Signed)
Medication sent to pharmacy  

## 2018-05-04 ENCOUNTER — Other Ambulatory Visit: Payer: Self-pay

## 2018-05-04 DIAGNOSIS — E119 Type 2 diabetes mellitus without complications: Secondary | ICD-10-CM

## 2018-05-04 MED ORDER — APIXABAN 5 MG PO TABS: 5.0000 mg | ORAL_TABLET | Freq: Two times a day (BID) | ORAL | 1 refills | Status: DC

## 2018-05-04 MED ORDER — INSULIN GLARGINE 100 UNIT/ML ~~LOC~~ SOLN: 20.0000 [IU] | Freq: Every day | SUBCUTANEOUS | 3 refills | Status: DC

## 2018-05-24 ENCOUNTER — Other Ambulatory Visit: Payer: Self-pay | Admitting: Family Medicine

## 2018-05-24 MED FILL — GEMFIBROZIL 600 MG TAB: 600 | 30 days supply | Qty: 60 | Fill #4

## 2018-05-24 MED FILL — $LANTUS 100 UNITS/ML VIAL: 100 | 84 days supply | Qty: 30 | Fill #4

## 2018-05-24 MED FILL — ?PRAVASTATIN SODIUM 20MG TA: 20 | 30 days supply | Qty: 30 | Fill #1

## 2018-05-24 MED FILL — ?METFORMIN HCL 500MG TABL: 500 | 30 days supply | Qty: 120 | Fill #1

## 2018-05-24 MED FILL — !JANUVIA 25 MG TABLET: 25 | 30 days supply | Qty: 30 | Fill #1

## 2018-05-24 MED FILL — FOLIC ACID 1 MG TABS: 1 | 30 days supply | Qty: 30 | Fill #1

## 2018-05-24 MED FILL — AMLODIPINE BESYLATE 5 MG TA: 5 | 30 days supply | Qty: 30 | Fill #6

## 2018-05-24 MED FILL — ?PANTOPRAZOLE SOD DR 40MG: 40 MG | 30 days supply | Qty: 30 | Fill #2

## 2018-05-25 ENCOUNTER — Other Ambulatory Visit: Payer: Self-pay

## 2018-05-25 MED ORDER — LISINOPRIL 20 MG PO TABS: 30.0000 mg | ORAL_TABLET | Freq: Every day | ORAL | 2 refills | Status: DC

## 2018-05-25 MED FILL — LISINOPRIL 20 MG TAB: 20 | 30 days supply | Qty: 45 | Fill #0

## 2018-05-25 NOTE — Telephone Encounter (Signed)
Medication has been sent to pharmacy.  °

## 2018-06-19 ENCOUNTER — Other Ambulatory Visit: Payer: Self-pay | Admitting: Family Medicine

## 2018-06-21 ENCOUNTER — Ambulatory Visit (INDEPENDENT_AMBULATORY_CARE_PROVIDER_SITE_OTHER): Payer: Self-pay | Admitting: Family Medicine

## 2018-06-21 ENCOUNTER — Other Ambulatory Visit: Payer: Self-pay

## 2018-06-21 ENCOUNTER — Encounter: Payer: Self-pay | Admitting: Family Medicine

## 2018-06-21 VITALS — BP 146/86 | HR 74 | Temp 97.7°F | Ht 64.0 in | Wt 178.0 lb

## 2018-06-21 DIAGNOSIS — E119 Type 2 diabetes mellitus without complications: Secondary | ICD-10-CM

## 2018-06-21 DIAGNOSIS — I10 Essential (primary) hypertension: Secondary | ICD-10-CM

## 2018-06-21 DIAGNOSIS — Z860100 Personal history of colon polyps, unspecified: Secondary | ICD-10-CM

## 2018-06-21 DIAGNOSIS — Z0000 Encounter for general adult medical examination without abnormal findings: Secondary | ICD-10-CM

## 2018-06-21 DIAGNOSIS — Z125 Encounter for screening for malignant neoplasm of prostate: Secondary | ICD-10-CM

## 2018-06-21 DIAGNOSIS — Z09 Encounter for follow-up examination after completed treatment for conditions other than malignant neoplasm: Secondary | ICD-10-CM

## 2018-06-21 DIAGNOSIS — Z7901 Long term (current) use of anticoagulants: Secondary | ICD-10-CM

## 2018-06-21 LAB — POCT URINALYSIS DIP (MANUAL ENTRY)
Bilirubin, UA: NEGATIVE
Blood, UA: NEGATIVE
Glucose, UA: 250 mg/dL — AB
Ketones, POC UA: NEGATIVE mg/dL
Leukocytes, UA: NEGATIVE
Nitrite, UA: NEGATIVE
Protein Ur, POC: 30 mg/dL — AB
Spec Grav, UA: 1.025 (ref 1.010–1.025)
Urobilinogen, UA: 0.2 E.U./dL
pH, UA: 5 (ref 5.0–8.0)

## 2018-06-21 LAB — POCT GLYCOSYLATED HEMOGLOBIN (HGB A1C): Hemoglobin A1C: 10.3 % — AB (ref 4.0–5.6)

## 2018-06-21 NOTE — Progress Notes (Signed)
Patient [facility]  Established Patient Office Visit  Subjective:  Patient ID: Caleb Hernandez, male    DOB: [birth year]  Age: 58 y.o. MRN: [id number]  CC:  Chief Complaint  Patient presents with  . Follow-up    Chronic conditions    HPI Caleb Hernandez is a 58 year old male who presents for follow up today.   Past Medical History:  Diagnosis Date  . Abdominal pain 09/17/2016  . Alcohol use disorder 09/26/2016  . Allergic rhinitis 08/28/2012  . Bilateral bunions 12/18/2014  . Cerebellar stroke, acute ([place]) 10/05/2016  . 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 Corns and callosity 08/28/2012  . Diabetes mellitus without complication ([place])   . Diverticulitis 09/17/2016  . Early cataracts, bilateral 01/03/2014  . Encounter for long-term (current) use of other medications 08/28/2012  . Essential hypertension 09/17/2016  . First degree heart block 08/28/2012  . GI bleed 09/17/2016  . High cholesterol   . Hypercholesterolemia 04/13/2013  . Hypertension   . Increased band cell count 09/26/2016  . Male hypogonadism 11/16/2013  . Nondependent alcohol abuse 09/17/2016  . Onychomycosis of toenail 12/18/2014  . Positive RPR test 09/27/2016  . Premature beats 08/28/2012  . Proteinuria 04/13/2013  . 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/2013  . Tinea pedis 08/28/2012  . Tinea pedis of both feet 12/18/2014  . Tobacco use 09/17/2016  . Type 2 diabetes mellitus, without long-term current use of insulin ([place]) 09/17/2016  . Undiagnosed cardiac murmurs 04/20/2012  . Vitamin D deficiency 04/13/2013   Current Status: Since his last office visit, he is doing well with no complaints. He denies visual changes, chest pain, cough, shortness of breath, heart palpitations, and falls. He has occasional headaches and dizziness with position changes. Denies severe headaches, confusion, seizures, double vision, and blurred vision, nausea and vomiting. He has  history of Stroke and is currently taking Eliquis everyday as prescribed. He has left sided residual weakness, which he uses cane to aide in safe ambulation. He denies fatigue, frequent urination, blurred vision, excessive hunger, excessive thirst, weight gain, weight loss, and poor wound healing.   He denies fevers, chills, fatigue, recent infections, weight loss, and night sweats. No reports of GI problems such as nausea, vomiting, diarrhea, and constipation. He has no reports of blood in stools, dysuria and hematuria. No depression or anxiety reported. He denies pain today.   Past Surgical History:  Procedure Laterality Date  . BACK SURGERY    . LOOP RECORDER INSERTION N/A 10/17/2016   Procedure: Loop Recorder Insertion;  Surgeon: Evans Lance, MD;  Location: [facility];  Service: Cardiovascular;  Laterality: N/A;    Family History  Problem Relation Age of Onset  . Heart disease Brother   . Diabetes Mother   . Hypertension Mother   . Diabetes Father   . Hypercalcemia Father   . Stomach cancer Brother   . AAA (abdominal aortic aneurysm) Brother   . Colon cancer Neg Hx     Social History   Socioeconomic History  . Marital status: Single    Spouse name: Not on file  . Number of children: Not on file  . Years of education: Not on file  . Highest education level: Not on file  Occupational History  . Not on file  Social Needs  . Financial resource strain: Not on file  . Food insecurity:    Worry: Not on file    Inability:  Not on file  . Transportation needs:    Medical: Not on file    Non-medical: Not on file  Tobacco Use  . Smoking status: Former Smoker    Last attempt to quit: 09/28/2016    Years since quitting: 1.7  . Smokeless tobacco: Never Used  Substance and Sexual Activity  . Alcohol use: Yes    Comment: 40 oz per day/ former  . Drug use: No  . Sexual activity: Yes    Partners: Female  Lifestyle  . Physical activity:    Days per week: Not on file     Minutes per session: Not on file  . Stress: Not on file  Relationships  . Social connections:    Talks on phone: Not on file    Gets together: Not on file    Attends religious service: Not on file    Active member of club or organization: Not on file    Attends meetings of clubs or organizations: Not on file    Relationship status: Not on file  . Intimate partner violence:    Fear of current or ex partner: Not on file    Emotionally abused: Not on file    Physically abused: Not on file    Forced sexual activity: Not on file  Other Topics Concern  . Not on file  Social History Narrative  . Not on file    Outpatient Medications Prior to Visit  Medication Sig Dispense Refill  . amLODipine (NORVASC) 5 MG tablet Take 1 tablet (5 mg total) by mouth every other day. 30 tablet 2  . apixaban (ELIQUIS) 5 MG TABS tablet Take 1 tablet (5 mg total) by mouth 2 (two) times daily. 180 tablet 1  . blood glucose meter kit and supplies KIT Dispense based on patient and insurance preference. Use up to four times daily as directed. (FOR ICD-10. E.11.8). 1 each 0  . folic acid (FOLVITE) 1 MG tablet Take 1 tablet (1 mg total) by mouth daily. 90 tablet 1  . gemfibrozil (LOPID) 600 MG tablet Take 1 tablet (600 mg total) by mouth 2 (two) times daily before a meal. 60 tablet 6  . insulin glargine (LANTUS) 100 UNIT/ML injection Inject 0.2 mLs (20 Units total) into the skin at bedtime. 40 mL 3  . Insulin Pen Needle (NOVOFINE) 30G X 8 MM MISC Inject 10 each into the skin as needed. 200 each 2  . Insulin Syringe-Needle U-100 28G X 1/2" 1 ML MISC Use to administer insulin once daily. e11.8 300 each 0  . lisinopril (PRINIVIL,ZESTRIL) 20 MG tablet Take 1.5 tablets (30 mg total) by mouth daily. 60 tablet 2  . Magnesium 250 MG TABS Take 1 tablet by mouth daily.    . metFORMIN (GLUCOPHAGE) 500 MG tablet Take 2 tablets (1,000 mg total) by mouth 2 (two) times daily with a meal. 180 tablet 1  . Omega-3 Fatty Acids (FISH  OIL) 600 MG CAPS Take 2 capsules by mouth 2 (two) times daily.    . pantoprazole (PROTONIX) 40 MG tablet Take 1 tablet (40 mg total) by mouth daily. 30 tablet 2  . pravastatin (PRAVACHOL) 20 MG tablet Take 1 tablet (20 mg total) by mouth daily. 90 tablet 1  . sitaGLIPtin (JANUVIA) 25 MG tablet Take 1 tablet (25 mg total) by mouth daily. 90 tablet 3  . Thiamine HCl (VITAMIN B-1) 250 MG tablet Take 250 mg by mouth daily.    . TRUE METRIX BLOOD GLUCOSE TEST test strip  USE AS DIRECTED UP TO 4 TIMES DAILY 200 each 2  . TRUEPLUS INSULIN SYRINGE 30G X 5/16" 1 ML MISC 30 mg.    . TRUEPLUS LANCETS 30G MISC USE AS DIRECTED UP TO FOUR TIMES DAILY 200 each 0   No facility-administered medications prior to visit.     No Known Allergies  ROS Review of Systems  Constitutional: Negative.   HENT: Negative.   Eyes: Negative.   Respiratory: Negative.   Cardiovascular: Negative.   Gastrointestinal: Negative.   Endocrine: Negative.   Genitourinary: Negative.   Musculoskeletal: Negative.   Skin: Negative.   Allergic/Immunologic: Negative.   Neurological: Positive for dizziness, weakness (residual left sided weakness r/t history of stroke. ) and headaches.  Hematological: Negative.   Psychiatric/Behavioral: Negative.    Objective:    Physical Exam  Constitutional: He is oriented to person, place, and time. He appears well-developed and well-nourished.  HENT:  Head: Normocephalic and atraumatic.  Eyes: Conjunctivae are normal.  Neck: Normal range of motion. Neck supple.  Cardiovascular: Normal rate, regular rhythm, normal heart sounds and intact distal pulses.  Pulmonary/Chest: Effort normal and breath sounds normal.  Abdominal: Soft. Bowel sounds are normal.  Musculoskeletal: Normal range of motion.  Neurological: He is alert and oriented to person, place, and time. He has normal reflexes.  Skin: Skin is warm and dry.  Psychiatric: He has a normal mood and affect. His behavior is normal.  Judgment and thought content normal.  Nursing note and vitals reviewed.   BP (!) 146/86 (BP Location: Left Arm, Patient Position: Sitting, Cuff Size: Small)   Pulse 74   Temp 97.7 F (36.5 C) (Oral)   Ht 5' 4" (1.626 m)   Wt 178 lb (80.7 kg)   SpO2 98%   BMI 30.55 kg/m  Wt Readings from Last 3 Encounters:  06/21/18 178 lb (80.7 kg)  03/22/18 179 lb (81.2 kg)  12/14/17 174 lb (78.9 kg)     Health Maintenance Due  Topic Date Due  . PNEUMOCOCCAL POLYSACCHARIDE VACCINE AGE 10-64 HIGH RISK  06/09/1963  . OPHTHALMOLOGY EXAM  06/09/1971  . COLONOSCOPY  06/09/2011    There are no preventive care reminders to display for this patient.  No results found for: TSH Lab Results  Component Value Date   WBC 9.0 10/02/2016   HGB 13.9 10/02/2016   HCT 41.8 10/02/2016   MCV 86.9 10/02/2016   PLT 476 (H) 10/02/2016   Lab Results  Component Value Date   NA 139 07/01/2017   K 4.6 07/01/2017   CO2 22 07/01/2017   GLUCOSE 133 (H) 07/01/2017   BUN 11 07/01/2017   CREATININE 0.95 07/01/2017   BILITOT 0.3 07/01/2017   ALKPHOS 88 07/01/2017   AST 17 07/01/2017   ALT 23 07/01/2017   PROT 7.8 07/01/2017   ALBUMIN 4.8 07/01/2017   CALCIUM 10.1 07/01/2017   ANIONGAP 9 09/20/2016   Lab Results  Component Value Date   CHOL 161 07/01/2017   Lab Results  Component Value Date   HDL 29 (L) 07/01/2017   Lab Results  Component Value Date   LDLCALC 98 07/01/2017   Lab Results  Component Value Date   TRIG 169 (H) 07/01/2017   Lab Results  Component Value Date   CHOLHDL 5.8 (H) 06/01/2017   Lab Results  Component Value Date   HGBA1C 10.3 (A) 06/21/2018   Assessment & Plan:   1. Type 2 diabetes mellitus without complication, without long-term current use of insulin ([place]) Continues  to remain stable at 10.3 today. He will continue to decrease foods/beverages high in sugars and carbs and follow Heart Healthy or DASH diet. Increase physical activity to at least 30 minutes cardio  exercise daily.  - POCT glycosylated hemoglobin (Hb A1C) - POCT urinalysis dipstick - CBC with Differential - Comprehensive metabolic panel  2. Healthcare maintenance - CBC with Differential - Comprehensive metabolic panel - Magnesium - TSH - Vitamin D, 25-hydroxy - Vitamin B12 - PSA  3. Screening PSA (prostate specific antigen) - PSA  4. Hypomagnesemia - Magnesium  5. Hypertension, unspecified type Blood pressure is stable today. Continue Antihypertensive medications as prescribed. He will continue to decrease high sodium intake, excessive alcohol intake, increase potassium intake, smoking cessation, and increase physical activity of at least 30 minutes of cardio activity daily. He will continue to follow Heart Healthy or DASH diet. - CBC with Differential - Comprehensive metabolic panel  6. History of colon polyps Last Colonoscopy 7 years ago revealed polyps. We will refer to GI today.  - Ambulatory referral to Gastroenterology  7. Chronic anticoagulation Continue Eliquis as prescribed. He denies easy bruising, petechiae, gingival bleeding, epistaxis, profuse bleeding from superficial cuts, menorrhagia, mentrorrhagia, hematochezia, and hematuria.   8. Follow up He will follow up in 3 months.   No orders of the defined types were placed in this encounter.   Orders Placed This Encounter  Procedures  . CBC with Differential  . Comprehensive metabolic panel  . Magnesium  . TSH  . Vitamin D, 25-hydroxy  . Vitamin B12  . PSA  . Ambulatory referral to Gastroenterology  . POCT glycosylated hemoglobin (Hb A1C)  . POCT urinalysis dipstick    Referral Orders     Ambulatory referral to Gastroenterology   Kathe Becton,  MSN, [place], [place] [address] [phone]   Problem List Items Addressed This Visit      Endocrine   Type 2 diabetes mellitus, without long-term current use of insulin  ([place]) - Primary   Relevant Orders   POCT glycosylated hemoglobin (Hb A1C) (Completed)   POCT urinalysis dipstick (Completed)   CBC with Differential   Comprehensive metabolic panel     Other   Chronic anticoagulation    Other Visit Diagnoses    Healthcare maintenance       Relevant Orders   CBC with Differential   Comprehensive metabolic panel   Magnesium   TSH   Vitamin D, 25-hydroxy   Vitamin B12   PSA   Screening PSA (prostate specific antigen)       Relevant Orders   PSA   Hypomagnesemia       Relevant Orders   Magnesium   Hypertension, unspecified type       Relevant Orders   CBC with Differential   Comprehensive metabolic panel   History of colon polyps       Relevant Orders   Ambulatory referral to Gastroenterology   Follow up          No orders of the defined types were placed in this encounter.   Follow-up: Return in about 3 months (around 09/21/2018).    Azzie Glatter, FNP

## 2018-06-21 NOTE — Patient Instructions (Signed)
Diabetes Mellitus and Nutrition, Adult  When you have diabetes (diabetes mellitus), it is very important to have healthy eating habits because your blood sugar (glucose) levels are greatly affected by what you eat and drink. Eating healthy foods in the appropriate amounts, at about the same times every day, can help you:  · Control your blood glucose.  · Lower your risk of heart disease.  · Improve your blood pressure.  · Reach or maintain a healthy weight.  Every person with diabetes is different, and each person has different needs for a meal plan. Your health care provider may recommend that you work with a diet and nutrition specialist (dietitian) to make a meal plan that is best for you. Your meal plan may vary depending on factors such as:  · The calories you need.  · The medicines you take.  · Your weight.  · Your blood glucose, blood pressure, and cholesterol levels.  · Your activity level.  · Other health conditions you have, such as heart or kidney disease.  How do carbohydrates affect me?  Carbohydrates, also called carbs, affect your blood glucose level more than any other type of food. Eating carbs naturally raises the amount of glucose in your blood. Carb counting is a method for keeping track of how many carbs you eat. Counting carbs is important to keep your blood glucose at a healthy level, especially if you use insulin or take certain oral diabetes medicines.  It is important to know how many carbs you can safely have in each meal. This is different for every person. Your dietitian can help you calculate how many carbs you should have at each meal and for each snack.  Foods that contain carbs include:  · Bread, cereal, rice, pasta, and crackers.  · Potatoes and corn.  · Peas, beans, and lentils.  · Milk and yogurt.  · Fruit and juice.  · Desserts, such as cakes, cookies, ice cream, and candy.  How does alcohol affect me?  Alcohol can cause a sudden decrease in blood glucose (hypoglycemia),  especially if you use insulin or take certain oral diabetes medicines. Hypoglycemia can be a life-threatening condition. Symptoms of hypoglycemia (sleepiness, dizziness, and confusion) are similar to symptoms of having too much alcohol.  If your health care provider says that alcohol is safe for you, follow these guidelines:  · Limit alcohol intake to no more than 1 drink per day for nonpregnant women and 2 drinks per day for men. One drink equals 12 oz of beer, 5 oz of wine, or 1½ oz of hard liquor.  · Do not drink on an empty stomach.  · Keep yourself hydrated with water, diet soda, or unsweetened iced tea.  · Keep in mind that regular soda, juice, and other mixers may contain a lot of sugar and must be counted as carbs.  What are tips for following this plan?    Reading food labels  · Start by checking the serving size on the "Nutrition Facts" label of packaged foods and drinks. The amount of calories, carbs, fats, and other nutrients listed on the label is based on one serving of the item. Many items contain more than one serving per package.  · Check the total grams (g) of carbs in one serving. You can calculate the number of servings of carbs in one serving by dividing the total carbs by 15. For example, if a food has 30 g of total carbs, it would be equal to 2   servings of carbs.  · Check the number of grams (g) of saturated and trans fats in one serving. Choose foods that have low or no amount of these fats.  · Check the number of milligrams (mg) of salt (sodium) in one serving. Most people should limit total sodium intake to less than 2,300 mg per day.  · Always check the nutrition information of foods labeled as "low-fat" or "nonfat". These foods may be higher in added sugar or refined carbs and should be avoided.  · Talk to your dietitian to identify your daily goals for nutrients listed on the label.  Shopping  · Avoid buying canned, premade, or processed foods. These foods tend to be high in fat, sodium,  and added sugar.  · Shop around the outside edge of the grocery store. This includes fresh fruits and vegetables, bulk grains, fresh meats, and fresh dairy.  Cooking  · Use low-heat cooking methods, such as baking, instead of high-heat cooking methods like deep frying.  · Cook using healthy oils, such as olive, canola, or sunflower oil.  · Avoid cooking with butter, cream, or high-fat meats.  Meal planning  · Eat meals and snacks regularly, preferably at the same times every day. Avoid going long periods of time without eating.  · Eat foods high in fiber, such as fresh fruits, vegetables, beans, and whole grains. Talk to your dietitian about how many servings of carbs you can eat at each meal.  · Eat 4-6 ounces (oz) of lean protein each day, such as lean meat, chicken, fish, eggs, or tofu. One oz of lean protein is equal to:  ? 1 oz of meat, chicken, or fish.  ? 1 egg.  ? ¼ cup of tofu.  · Eat some foods each day that contain healthy fats, such as avocado, nuts, seeds, and fish.  Lifestyle  · Check your blood glucose regularly.  · Exercise regularly as told by your health care provider. This may include:  ? 150 minutes of moderate-intensity or vigorous-intensity exercise each week. This could be brisk walking, biking, or water aerobics.  ? Stretching and doing strength exercises, such as yoga or weightlifting, at least 2 times a week.  · Take medicines as told by your health care provider.  · Do not use any products that contain nicotine or tobacco, such as cigarettes and e-cigarettes. If you need help quitting, ask your health care provider.  · Work with a counselor or diabetes educator to identify strategies to manage stress and any emotional and social challenges.  Questions to ask a health care provider  · Do I need to meet with a diabetes educator?  · Do I need to meet with a dietitian?  · What number can I call if I have questions?  · When are the best times to check my blood glucose?  Where to find more  information:  · American Diabetes Association: diabetes.org  · Academy of Nutrition and Dietetics: www.eatright.org  · National Institute of Diabetes and Digestive and Kidney Diseases (NIH): www.niddk.nih.gov  Summary  · A healthy meal plan will help you control your blood glucose and maintain a healthy lifestyle.  · Working with a diet and nutrition specialist (dietitian) can help you make a meal plan that is best for you.  · Keep in mind that carbohydrates (carbs) and alcohol have immediate effects on your blood glucose levels. It is important to count carbs and to use alcohol carefully.  This information is not intended to   replace advice given to you by your health care provider. Make sure you discuss any questions you have with your health care provider.  Document Released: 12/05/2004 Document Revised: 10/08/2016 Document Reviewed: 04/14/2016  Elsevier Interactive Patient Education © 2019 [employer]

## 2018-06-22 ENCOUNTER — Other Ambulatory Visit: Payer: Self-pay | Admitting: Cardiology

## 2018-06-22 LAB — COMPREHENSIVE METABOLIC PANEL
ALT: 17 IU/L (ref 0–44)
AST: 16 IU/L (ref 0–40)
Albumin/Globulin Ratio: 1.8 (ref 1.2–2.2)
Albumin: 4.6 g/dL (ref 3.8–4.9)
Alkaline Phosphatase: 90 IU/L (ref 39–117)
BUN/Creatinine Ratio: 14 (ref 9–20)
BUN: 12 mg/dL (ref 6–24)
Bilirubin Total: 0.3 mg/dL (ref 0.0–1.2)
CO2: 18 mmol/L — ABNORMAL LOW (ref 20–29)
Calcium: 10.3 mg/dL — ABNORMAL HIGH (ref 8.7–10.2)
Chloride: 102 mmol/L (ref 96–106)
Creatinine, Ser: 0.87 mg/dL (ref 0.76–1.27)
GFR calc Af Amer: 111 mL/min/{1.73_m2} (ref >59)
GFR calc non Af Amer: 96 mL/min/{1.73_m2} (ref >59)
Globulin, Total: 2.5 g/dL (ref 1.5–4.5)
Glucose: 202 mg/dL — ABNORMAL HIGH (ref 65–99)
Potassium: 4.5 mmol/L (ref 3.5–5.2)
Sodium: 139 mmol/L (ref 134–144)
Total Protein: 7.1 g/dL (ref 6.0–8.5)

## 2018-06-22 LAB — CBC WITH DIFFERENTIAL/PLATELET
Basophils Absolute: 0 10*3/uL (ref 0.0–0.2)
Basos: 0 %
EOS (ABSOLUTE): 0.3 10*3/uL (ref 0.0–0.4)
Eos: 3 %
Hematocrit: 41.6 % (ref 37.5–51.0)
Hemoglobin: 14.6 g/dL (ref 13.0–17.7)
Immature Grans (Abs): 0.1 10*3/uL (ref 0.0–0.1)
Immature Granulocytes: 2 %
Lymphocytes Absolute: 4.3 10*3/uL — ABNORMAL HIGH (ref 0.7–3.1)
Lymphs: 45 %
MCH: 29 pg (ref 26.6–33.0)
MCHC: 35.1 g/dL (ref 31.5–35.7)
MCV: 83 fL (ref 79–97)
Monocytes Absolute: 0.4 10*3/uL (ref 0.1–0.9)
Monocytes: 5 %
Neutrophils Absolute: 4.4 10*3/uL (ref 1.4–7.0)
Neutrophils: 45 %
Platelets: 329 10*3/uL (ref 150–450)
RBC: 5.03 x10E6/uL (ref 4.14–5.80)
RDW: 13.2 % (ref 11.6–15.4)
WBC: 9.5 10*3/uL (ref 3.4–10.8)

## 2018-06-22 LAB — VITAMIN D 25 HYDROXY (VIT D DEFICIENCY, FRACTURES): Vit D, 25-Hydroxy: 17.5 ng/mL — ABNORMAL LOW (ref 30.0–100.0)

## 2018-06-22 LAB — VITAMIN B12: Vitamin B-12: 467 pg/mL (ref 232–1245)

## 2018-06-22 LAB — PSA: Prostate Specific Ag, Serum: 1.2 ng/mL (ref 0.0–4.0)

## 2018-06-22 LAB — MAGNESIUM: Magnesium: 1.7 mg/dL (ref 1.6–2.3)

## 2018-06-22 LAB — TSH: TSH: 0.514 u[IU]/mL (ref 0.450–4.500)

## 2018-06-22 MED FILL — ?AMLODIPINE BESYLATE 5MG TA: 5 | 30 days supply | Qty: 30 | Fill #0

## 2018-06-22 MED FILL — FOLIC ACID 1 MG TABS: 1 | 30 days supply | Qty: 30 | Fill #2

## 2018-06-22 MED FILL — LISINOPRIL 20 MG TABLET: 20 | 30 days supply | Qty: 45 | Fill #1

## 2018-06-22 MED FILL — GEMFIBROZIL 600 MG TAB: 600 | 30 days supply | Qty: 60 | Fill #0

## 2018-06-22 MED FILL — !JANUVIA 25 MG TABLET: 25 | 30 days supply | Qty: 30 | Fill #2

## 2018-06-22 MED FILL — ?METFORMIN HCL 500MG TABL: 500 | 30 days supply | Qty: 120 | Fill #2

## 2018-06-22 MED FILL — PRAVASTATIN SODIUM 20 MG TA: 20 | 30 days supply | Qty: 30 | Fill #2

## 2018-06-22 NOTE — Telephone Encounter (Signed)
Sent to [place] error. Was seen at internal med

## 2018-06-23 ENCOUNTER — Encounter: Payer: Self-pay | Admitting: Family Medicine

## 2018-06-23 ENCOUNTER — Other Ambulatory Visit: Payer: Self-pay | Admitting: Family Medicine

## 2018-06-23 ENCOUNTER — Other Ambulatory Visit: Payer: Self-pay

## 2018-06-23 DIAGNOSIS — E559 Vitamin D deficiency, unspecified: Secondary | ICD-10-CM

## 2018-06-23 DIAGNOSIS — K219 Gastro-esophageal reflux disease without esophagitis: Secondary | ICD-10-CM

## 2018-06-23 MED ORDER — VITAMIN D (ERGOCALCIFEROL) 1.25 MG (50000 UNIT) PO CAPS: 50000.0000 [IU] | ORAL_CAPSULE | ORAL | 3 refills | Status: DC

## 2018-06-23 MED ORDER — PANTOPRAZOLE SODIUM 40 MG PO TBEC: 40.0000 mg | DELAYED_RELEASE_TABLET | Freq: Every day | ORAL | 2 refills | Status: DC

## 2018-06-23 MED FILL — TRUEPLUS SYR 1ML 30GX5/16: 30G X 5/16" | 30 days supply | Qty: 100 | Fill #0

## 2018-06-23 MED FILL — TRUEPLUS SYR 1ML 30GX5/16": 30G X 5/16" | 30 days supply | Qty: 100 | Fill #0

## 2018-06-23 NOTE — Telephone Encounter (Signed)
Medication has been sent to pharmacy.  °

## 2018-06-24 ENCOUNTER — Other Ambulatory Visit: Payer: Self-pay

## 2018-06-24 DIAGNOSIS — K219 Gastro-esophageal reflux disease without esophagitis: Secondary | ICD-10-CM

## 2018-06-24 MED ORDER — PANTOPRAZOLE SODIUM 40 MG PO TBEC: 40.0000 mg | DELAYED_RELEASE_TABLET | Freq: Every day | ORAL | 2 refills | Status: DC

## 2018-06-24 MED FILL — VIT D2 1.25 MG (50,000 UNIT: 1.25 MG | 35 days supply | Qty: 5 | Fill #0

## 2018-06-24 MED FILL — ?PANTOPRAZOLE SOD DR 40MGTA: 40 | 30 days supply | Qty: 30 | Fill #0

## 2018-07-26 ENCOUNTER — Other Ambulatory Visit: Payer: Self-pay | Admitting: Family Medicine

## 2018-07-26 MED FILL — ?AMLODIPINE BESYLATE 5MG TA: 5 | 30 days supply | Qty: 30 | Fill #1

## 2018-07-26 MED FILL — LISINOPRIL 20 MG TAB: 20 | 30 days supply | Qty: 45 | Fill #2

## 2018-07-26 MED FILL — FOLIC ACID 1 MG TABS: 1 | 30 days supply | Qty: 30 | Fill #3

## 2018-07-26 MED FILL — ?PRAVASTATIN NA 20MG TABL: 20 | 90 days supply | Qty: 90 | Fill #3

## 2018-07-26 MED FILL — VIT D2 1.25 MG (50,000 UNIT: 1.25 MG | 84 days supply | Qty: 12 | Fill #1

## 2018-07-26 MED FILL — GEMFIBROZIL 600 MG TAB: 600 | 30 days supply | Qty: 60 | Fill #1

## 2018-07-26 MED FILL — ?PANTOPRAZOLE SOD DR 40MGTA: 40 | 60 days supply | Qty: 60 | Fill #1

## 2018-08-02 ENCOUNTER — Other Ambulatory Visit: Payer: Self-pay

## 2018-08-02 ENCOUNTER — Telehealth: Payer: Self-pay

## 2018-08-02 MED ORDER — METFORMIN HCL 500 MG PO TABS: 1000.0000 mg | ORAL_TABLET | Freq: Two times a day (BID) | ORAL | 1 refills | Status: DC

## 2018-08-02 MED FILL — ?METFORMIN HCL 500MG TABLET: 500 | 30 days supply | Qty: 120 | Fill #0

## 2018-08-02 NOTE — Telephone Encounter (Signed)
Medication has been sent to pharmacy.  °

## 2018-08-30 MED FILL — ?FOLIC ACID 1MG TAB: 1 | 30 days supply | Qty: 30 | Fill #4

## 2018-08-30 MED FILL — LISINOPRIL 20 MG TABLET: 20 | 30 days supply | Qty: 45 | Fill #3

## 2018-08-30 MED FILL — ?AMLODIPINE BESYLATE 5MG TA: 5 | 30 days supply | Qty: 15 | Fill #2

## 2018-08-30 MED FILL — ?METFORMIN HCL 500MG TABLET: 500 | 30 days supply | Qty: 120 | Fill #1

## 2018-08-30 MED FILL — GEMFIBROZIL 600 MG TAB: 600 | 30 days supply | Qty: 60 | Fill #2

## 2018-09-09 ENCOUNTER — Telehealth: Payer: Self-pay

## 2018-09-09 ENCOUNTER — Encounter: Payer: Self-pay | Admitting: Internal Medicine

## 2018-09-09 ENCOUNTER — Other Ambulatory Visit: Payer: Self-pay

## 2018-09-09 ENCOUNTER — Ambulatory Visit (INDEPENDENT_AMBULATORY_CARE_PROVIDER_SITE_OTHER): Payer: Self-pay | Admitting: Internal Medicine

## 2018-09-09 DIAGNOSIS — E119 Type 2 diabetes mellitus without complications: Secondary | ICD-10-CM

## 2018-09-09 DIAGNOSIS — Z7901 Long term (current) use of anticoagulants: Secondary | ICD-10-CM

## 2018-09-09 DIAGNOSIS — Z860101 Personal history of adenomatous and serrated colon polyps: Secondary | ICD-10-CM

## 2018-09-09 DIAGNOSIS — Z86010 Personal history of colonic polyps: Secondary | ICD-10-CM

## 2018-09-09 DIAGNOSIS — Z794 Long term (current) use of insulin: Secondary | ICD-10-CM

## 2018-09-09 HISTORY — DX: Personal history of adenomatous and serrated colon polyps: Z86.0101

## 2018-09-09 HISTORY — DX: Personal history of colonic polyps: Z86.010

## 2018-09-09 NOTE — Assessment & Plan Note (Signed)
Will modify insulin regimen per protocol to reduce risk of hypoglycemia related to colonoscopy preparation and fasting.

## 2018-09-09 NOTE — Patient Instructions (Signed)
It was nice to speak to you today.  As we discussed we will arrange for you to have a colonoscopy because of your history of colon polyps, looking for polyps that have grown since that time to reduce your risk of getting colon cancer.   We will modify your medications prior to the procedure as explained in the instructions you will receive.  You will need to stop your Eliquis 2 days prior to the procedure.  We will check with Kathe Becton, FNP regarding this.  I think the overall risk of a stroke off of this is very low though I cannot guarantee it we need to hold the Eliquis to reduce the risk of bleeding from your procedure.   I appreciate the opportunity to care for you. Gatha Mayer, MD, [facility]

## 2018-09-09 NOTE — Assessment & Plan Note (Signed)
It is been almost 7 years since his last colonoscopy it is a reasonable time to repeat a colonoscopy.  In the time frame of the guidelines.The risks and benefits as well as alternatives of endoscopic procedure(s) have been discussed and reviewed. All questions answered. The patient agrees to proceed. We will hold Eliquis 2 days prior to the colonoscopy.  Clarify with prescriber.  Extra risks of problems with stroke etc. discussed with the patient.  He does not have documented A. fib.  Was thought to have embolic stroke. I explained that we cannot guarantee 100% safety from COVID-19 infection by presenting for procedure but that we were practicing preventive and risk.

## 2018-09-09 NOTE — Assessment & Plan Note (Signed)
Hold Eliquis 2 days prior to colonoscopy.  Clarify with prescriber.  I have explained rationale for holding Eliquis and rare but real potential for stroke off this medication to the patient.

## 2018-09-09 NOTE — Progress Notes (Signed)
TELEHEALTH ENCOUNTER IN SETTING OF COVID-19 PANDEMIC - REQUESTED BY PATIENT SERVICE PROVIDED BY TELEMEDECINE - TYPE: telephone PATIENT LOCATION: home PATIENT HAS CONSENTED TO TELEHEALTH VISIT PROVIDER LOCATION: OFFICE REFERRING PROVIDER: Azzie Glatter, FNP PARTICIPANTS OTHER THAN PATIENT:none TIME SPENT ON CALL:12 mins    Caleb Hernandez 58 y.o. 01/22/1961 [id number]  Assessment & Plan:  Hx of adenomatous polyps of colon It is been almost 7 years since his last colonoscopy it is a reasonable time to repeat a colonoscopy.  In the time frame of the guidelines.The risks and benefits as well as alternatives of endoscopic procedure(s) have been discussed and reviewed. All questions answered. The patient agrees to proceed. We will hold Eliquis 2 days prior to the colonoscopy.  Clarify with prescriber.  Extra risks of problems with stroke etc. discussed with the patient.  He does not have documented A. fib.  Was thought to have embolic stroke. I explained that we cannot guarantee 100% safety from COVID-19 infection by presenting for procedure but that we were practicing preventive and risk.  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  Clarify with prescriber.  I have explained rationale for holding Eliquis and rare but real potential for stroke off this medication to the patient.  Type 2 diabetes mellitus, without long-term current use of insulin (HCC) Will modify insulin regimen per protocol to reduce risk of hypoglycemia related to colonoscopy preparation and fasting.  Type 2 diabetes mellitus with insulin therapy ([place]) Will modify insulin regimen to reduce the risk of hypoglycemia peri-procedure.      Subjective:   Chief Complaint: hx colon polyps on Eliquis  HPI Patient has hx of tubular adenomas on previous colonoscopy 2013 - was seen by [place] in 2018 but then had major illness with embolic stroke. Now returns to be considered for  colonoscopy. Nothaving GI sxs.   No Known Allergies Current Meds  Medication Sig  . amLODipine (NORVASC) 5 MG tablet Take 5 mg by mouth daily.  Marland Kitchen apixaban (ELIQUIS) 5 MG TABS tablet Take 1 tablet (5 mg total) by mouth 2 (two) times daily.  . blood glucose meter kit and supplies KIT Dispense based on patient and insurance preference. Use up to four times daily as directed. (FOR ICD-10. E.11.8).  . folic acid (FOLVITE) 1 MG tablet Take 1 tablet (1 mg total) by mouth daily.  Marland Kitchen gemfibrozil (LOPID) 600 MG tablet TAKE 1 TABLET (600 MG TOTAL) BY MOUTH 2 (TWO) TIMES DAILY BEFORE A MEAL.  Marland Kitchen insulin glargine (LANTUS) 100 UNIT/ML injection Inject 0.2 mLs (20 Units total) into the skin at bedtime.  . Insulin Pen Needle (NOVOFINE) 30G X 8 MM MISC Inject 10 each into the skin as needed.  . Insulin Syringe-Needle U-100 28G X 1/2" 1 ML MISC Use to administer insulin once daily. e11.8  . lisinopril (PRINIVIL,ZESTRIL) 20 MG tablet Take 1.5 tablets (30 mg total) by mouth daily.  . Magnesium 250 MG TABS Take 1 tablet by mouth daily.  . metFORMIN (GLUCOPHAGE) 500 MG tablet Take 2 tablets (1,000 mg total) by mouth 2 (two) times daily with a meal.  . Omega-3 Fatty Acids (FISH OIL) 600 MG CAPS Take 2 capsules by mouth 2 (two) times daily.  . pantoprazole (PROTONIX) 40 MG tablet Take 1 tablet (40 mg total) by mouth daily.  . pravastatin (PRAVACHOL) 20 MG tablet Take 1 tablet (20 mg total) by mouth daily.  . sitaGLIPtin (JANUVIA) 25 MG tablet Take 1 tablet (25 mg total)  by mouth daily.  . Thiamine HCl (VITAMIN B-1) 250 MG tablet Take 250 mg by mouth daily.  . TRUE METRIX BLOOD GLUCOSE TEST test strip USE AS DIRECTED UP TO 4 TIMES DAILY  . TRUEPLUS INSULIN SYRINGE 30G X 5/16" 1 ML MISC USE AS DIRECTED TO ADMINISTER INSULIN DAILY.  Marland Kitchen TRUEPLUS INSULIN SYRINGE 30G X 5/16" 1 ML MISC 30 mg.  . TRUEPLUS LANCETS 30G MISC USE AS DIRECTED UP TO FOUR TIMES DAILY  . VITAMIN D PO Take 250 mg by mouth daily.  . Vitamin D,  Ergocalciferol, (DRISDOL) 1.25 MG (50000 UT) CAPS capsule Take 1 capsule (50,000 Units total) by mouth every 7 (seven) days.   Past Medical History:  Diagnosis Date  . Abdominal pain 09/17/2016  . Alcohol use disorder 09/26/2016  . Allergic rhinitis 08/28/2012  . Bilateral bunions 12/18/2014  . Cerebellar stroke, acute ([place]) 10/05/2016  . 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 Corns and callosity 08/28/2012  . Diabetes mellitus without complication ([place])   . Diverticulitis 09/17/2016  . Early cataracts, bilateral 01/03/2014  . Encounter for long-term (current) use of other medications 08/28/2012  . Essential hypertension 09/17/2016  . First degree heart block 08/28/2012  . GI bleed 09/17/2016  . High cholesterol   . Hx of adenomatous polyps of colon 09/09/2018  . Hypercholesterolemia 04/13/2013  . Hypertension   . Increased band cell count 09/26/2016  . Male hypogonadism 11/16/2013  . Nondependent alcohol abuse 09/17/2016  . Onychomycosis of toenail 12/18/2014  . Positive RPR test 09/27/2016  . Premature beats 08/28/2012  . Proteinuria 04/13/2013  . 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/2013  . Tinea pedis 08/28/2012  . Tinea pedis of both feet 12/18/2014  . Tobacco use 09/17/2016  . Type 2 diabetes mellitus, without long-term current use of insulin ([place]) 09/17/2016  . Undiagnosed cardiac murmurs 04/20/2012  . Vitamin D deficiency 04/13/2013   Past Surgical History:  Procedure Laterality Date  . BACK SURGERY    . COLONOSCOPY  2013  . LOOP RECORDER INSERTION N/A 10/17/2016   Procedure: Loop Recorder Insertion;  Surgeon: Evans Lance, MD;  Location: [facility];  Service: Cardiovascular;  Laterality: N/A;  . PACEMAKER IMPLANT     June 2018 or June 2019   Social History   Social History Narrative   Single, unemployed   Uninsured   Hx EtOH   Smoker   No drugs   family history includes AAA (abdominal aortic aneurysm) in his brother; Diabetes in his father and  mother; Heart disease in his brother; Hypercalcemia in his father; Hypertension in his mother; Stomach cancer in his brother.   Review of Systems As above

## 2018-09-09 NOTE — Telephone Encounter (Signed)
[facility] Pre-operative Risk Assessment     Request for surgical clearance:     Endoscopy Procedure  What type of surgery is being performed?     colonoscopy  When is this surgery scheduled?     09/27/2018  What type of clearance is required ?   Pharmacy  Are there any medications that need to be held prior to surgery and how long? Eliquis, for 2 days  Practice name and name of physician performing surgery?      [facility]  What is your office phone and fax number?      Phone- [phone]  [phone]  Anesthesia type (None, local, MAC, general) ?       MAC

## 2018-09-09 NOTE — Assessment & Plan Note (Signed)
Will modify insulin regimen to reduce the risk of hypoglycemia peri-procedure.

## 2018-09-10 ENCOUNTER — Encounter: Payer: Self-pay | Admitting: Internal Medicine

## 2018-09-10 NOTE — Telephone Encounter (Signed)
Patient informed per Cardiology to hold Eliquis 2 days prior to Colonoscopy. Patient verbalized understanding.

## 2018-09-21 ENCOUNTER — Ambulatory Visit (INDEPENDENT_AMBULATORY_CARE_PROVIDER_SITE_OTHER): Payer: Self-pay | Admitting: Family Medicine

## 2018-09-21 ENCOUNTER — Encounter: Payer: Self-pay | Admitting: Family Medicine

## 2018-09-21 ENCOUNTER — Other Ambulatory Visit: Payer: Self-pay

## 2018-09-21 VITALS — BP 116/62 | HR 90 | Temp 97.9°F | Ht 64.0 in | Wt 172.2 lb

## 2018-09-21 DIAGNOSIS — I10 Essential (primary) hypertension: Secondary | ICD-10-CM

## 2018-09-21 DIAGNOSIS — Z8673 Personal history of transient ischemic attack (TIA), and cerebral infarction without residual deficits: Secondary | ICD-10-CM | POA: Insufficient documentation

## 2018-09-21 DIAGNOSIS — Z09 Encounter for follow-up examination after completed treatment for conditions other than malignant neoplasm: Secondary | ICD-10-CM

## 2018-09-21 DIAGNOSIS — E559 Vitamin D deficiency, unspecified: Secondary | ICD-10-CM

## 2018-09-21 DIAGNOSIS — Z7901 Long term (current) use of anticoagulants: Secondary | ICD-10-CM

## 2018-09-21 DIAGNOSIS — E119 Type 2 diabetes mellitus without complications: Secondary | ICD-10-CM

## 2018-09-21 DIAGNOSIS — R7309 Other abnormal glucose: Secondary | ICD-10-CM | POA: Insufficient documentation

## 2018-09-21 DIAGNOSIS — R531 Weakness: Secondary | ICD-10-CM

## 2018-09-21 LAB — POCT URINALYSIS DIP (MANUAL ENTRY)
Bilirubin, UA: NEGATIVE
Blood, UA: NEGATIVE
Glucose, UA: NEGATIVE mg/dL
Ketones, POC UA: NEGATIVE mg/dL
Leukocytes, UA: NEGATIVE
Nitrite, UA: NEGATIVE
Protein Ur, POC: 30 mg/dL — AB
Spec Grav, UA: 1.025 (ref 1.010–1.025)
Urobilinogen, UA: 0.2 E.U./dL
pH, UA: 5.5 (ref 5.0–8.0)

## 2018-09-21 LAB — POCT GLYCOSYLATED HEMOGLOBIN (HGB A1C): Hemoglobin A1C: 12.1 % — AB (ref 4.0–5.6)

## 2018-09-21 LAB — GLUCOSE, POCT (MANUAL RESULT ENTRY): POC Glucose: 243 mg/dl — AB (ref 70–99)

## 2018-09-21 MED ORDER — INSULIN GLARGINE 100 UNIT/ML ~~LOC~~ SOLN: 40.0000 [IU] | Freq: Every day | SUBCUTANEOUS | 11 refills | Status: DC

## 2018-09-21 MED FILL — $LANTUS 100 UNITS/ML VIAL: 100 | 75 days supply | Qty: 30 | Fill #0

## 2018-09-21 NOTE — Patient Instructions (Signed)

## 2018-09-21 NOTE — Progress Notes (Signed)
Patient [name]    Established Patient Office Visit  Subjective:  Patient ID: Caleb Hernandez, male    DOB: Sep 29, 1960  Age: 58 y.o. MRN: [id number]  CC:  Chief Complaint  Patient presents with   Follow-up    diabetes    HPI Caleb Hernandez is a 58 year old male who presents for follow up today.   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 ([place]) 09/26/2016   Corns and callosity 08/28/2012   Diabetes mellitus without complication ([place])    Diverticulitis 09/17/2016   Early cataracts, bilateral 01/03/2014   Encounter for long-term (current) use of other medications 08/28/2012   Essential hypertension 09/17/2016   First degree heart block 08/28/2012   GI bleed 09/17/2016   High cholesterol    Hx of adenomatous polyps of colon 09/09/2018   Hypercholesterolemia 04/13/2013   Hypertension    Increased band cell count 09/26/2016   Male hypogonadism 11/16/2013   Nondependent alcohol abuse 09/17/2016   Onychomycosis of toenail 12/18/2014   Positive RPR test 09/27/2016   Premature beats 08/28/2012   Proteinuria 04/13/2013   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/2013   Tinea pedis 08/28/2012   Tinea pedis of both feet 12/18/2014   Tobacco use 09/17/2016   Type 2 diabetes mellitus, without long-term current use of insulin ([place]) 09/17/2016   Undiagnosed cardiac murmurs 04/20/2012   Vitamin D deficiency 04/13/2013   Current Status: Since his last office visit, he is doing well with no complaints. His most recent normal range of preprandial blood glucose levels have been between 250-275. He has seen low range of 250 and high of 289 since his last office visit. He denies fatigue, frequent urination, blurred vision, excessive  hunger, excessive thirst, weight gain, weight loss, and poor wound healing. He continues to check his feet regularly. He denies visual changes, chest pain, cough, shortness of breath, heart palpitations, and falls. He has occasional headaches and dizziness with position changes. Denies severe headaches, confusion, seizures, double vision, and blurred vision, nausea and vomiting. He is scheduled for Colonoscopy on 09/27/2018. He reports that he has not been eating healthy lately. He arrives today with assistance of cane for ambulation.   He denies fevers, chills, recent infections, weight loss, and night sweats. He has not had any and falls. No chest pain, heart palpitations, cough and shortness of breath reported. No reports of GI problems such as diarrhea, and constipation. He has no reports of blood in stools, dysuria and hematuria. No depression or anxiety reported. He denies pain today.  Past Surgical History:  Procedure Laterality Date   BACK SURGERY     COLONOSCOPY  2013   LOOP RECORDER INSERTION N/A 10/17/2016   Procedure: Loop Recorder Insertion;  Surgeon: Evans Lance, MD;  Location: [facility];  Service: Cardiovascular;  Laterality: N/A;   PACEMAKER IMPLANT     June 2018 or June 2019    Family History  Problem Relation Age of Onset   Heart disease Brother    Diabetes Mother    Hypertension Mother    Diabetes Father    Hypercalcemia Father    Stomach cancer Brother    AAA (abdominal aortic aneurysm) Brother    Colon cancer Neg Hx    Esophageal  cancer Neg Hx     Social History   Socioeconomic History   Marital status: Single    Spouse name: Not on file   Number of children: Not on file   Years of education: Not on file   Highest education level: Not on file  Occupational History   Not on file  Social Needs   Financial resource strain: Not on file   Food insecurity    Worry: Not on file    Inability: Not on file   Transportation needs     Medical: Not on file    Non-medical: Not on file  Tobacco Use   Smoking status: Current Every Day Smoker    Types: Cigarettes   Smokeless tobacco: Never Used   Tobacco comment: Smoke about 5 cigarrettes per day  Substance and Sexual Activity   Alcohol use: Yes    Comment: 40 oz per day/ former   Drug use: No   Sexual activity: Yes    Partners: Female  Lifestyle   Physical activity    Days per week: Not on file    Minutes per session: Not on file   Stress: Not on file  Relationships   Social connections    Talks on phone: Not on file    Gets together: Not on file    Attends religious service: Not on file    Active member of club or organization: Not on file    Attends meetings of clubs or organizations: Not on file    Relationship status: Not on file   Intimate partner violence    Fear of current or ex partner: Not on file    Emotionally abused: Not on file    Physically abused: Not on file    Forced sexual activity: Not on file  Other Topics Concern   Not on file  Social History Narrative   Single, unemployed   Uninsured   Hx EtOH   Smoker   No drugs    Outpatient Medications Prior to Visit  Medication Sig Dispense Refill   amLODipine (NORVASC) 5 MG tablet Take 5 mg by mouth daily.     apixaban (ELIQUIS) 5 MG TABS tablet Take 1 tablet (5 mg total) by mouth 2 (two) times daily. 180 tablet 1   blood glucose meter kit and supplies KIT Dispense based on patient and insurance preference. Use up to four times daily as directed. (FOR ICD-10. E.11.8). 1 each 0   folic acid (FOLVITE) 1 MG tablet Take 1 tablet (1 mg total) by mouth daily. 90 tablet 1   gemfibrozil (LOPID) 600 MG tablet TAKE 1 TABLET (600 MG TOTAL) BY MOUTH 2 (TWO) TIMES DAILY BEFORE A MEAL. 180 tablet 1   Insulin Pen Needle (NOVOFINE) 30G X 8 MM MISC Inject 10 each into the skin as needed. 200 each 2   Insulin Syringe-Needle U-100 28G X 1/2" 1 ML MISC Use to administer insulin once  daily. e11.8 300 each 0   lisinopril (PRINIVIL,ZESTRIL) 20 MG tablet Take 1.5 tablets (30 mg total) by mouth daily. 60 tablet 2   Magnesium 250 MG TABS Take 1 tablet by mouth daily.     metFORMIN (GLUCOPHAGE) 500 MG tablet Take 2 tablets (1,000 mg total) by mouth 2 (two) times daily with a meal. 180 tablet 1   Omega-3 Fatty Acids (FISH OIL) 600 MG CAPS Take 2 capsules by mouth 2 (two) times daily.     pantoprazole (PROTONIX) 40 MG tablet Take 1 tablet (40 mg  total) by mouth daily. 30 tablet 2   pravastatin (PRAVACHOL) 20 MG tablet Take 1 tablet (20 mg total) by mouth daily. 90 tablet 1   sitaGLIPtin (JANUVIA) 25 MG tablet Take 1 tablet (25 mg total) by mouth daily. 90 tablet 3   Thiamine HCl (VITAMIN B-1) 250 MG tablet Take 250 mg by mouth daily.     TRUE METRIX BLOOD GLUCOSE TEST test strip USE AS DIRECTED UP TO 4 TIMES DAILY 200 each 2   TRUEPLUS INSULIN SYRINGE 30G X 5/16" 1 ML MISC USE AS DIRECTED TO ADMINISTER INSULIN DAILY. 300 each 0   TRUEPLUS INSULIN SYRINGE 30G X 5/16" 1 ML MISC 30 mg.     TRUEPLUS LANCETS 30G MISC USE AS DIRECTED UP TO FOUR TIMES DAILY 200 each 0   VITAMIN D PO Take 250 mg by mouth daily.     Vitamin D, Ergocalciferol, (DRISDOL) 1.25 MG (50000 UT) CAPS capsule Take 1 capsule (50,000 Units total) by mouth every 7 (seven) days. 5 capsule 3   insulin glargine (LANTUS) 100 UNIT/ML injection Inject 0.2 mLs (20 Units total) into the skin at bedtime. 40 mL 3   No facility-administered medications prior to visit.     No Known Allergies  ROS Review of Systems  Constitutional: Negative.   HENT: Negative.   Eyes: Negative.   Respiratory: Negative.   Cardiovascular: Negative.   Gastrointestinal: Positive for abdominal distention (obese).  Endocrine: Negative.   Genitourinary: Negative.   Musculoskeletal: Positive for arthralgias (generalized).  Skin: Negative.   Allergic/Immunologic: Negative.   Neurological: Positive for dizziness (occasional),  weakness (left sided weakness r/t history of stroke. ) and headaches (occasional).  Hematological: Negative.   Psychiatric/Behavioral: Negative.       Objective:    Physical Exam  Constitutional: He is oriented to person, place, and time. He appears well-developed and well-nourished.  HENT:  Head: Normocephalic and atraumatic.  Eyes: Conjunctivae are normal.  Neck: Normal range of motion. Neck supple.  Cardiovascular: Normal rate, regular rhythm, normal heart sounds and intact distal pulses.  Pulmonary/Chest: Effort normal and breath sounds normal.  Abdominal: Soft. Bowel sounds are normal. He exhibits distension (obese).  Musculoskeletal: Normal range of motion.  Neurological: He is alert and oriented to person, place, and time. He has normal reflexes.  Skin: Skin is warm and dry.  Psychiatric: He has a normal mood and affect. His behavior is normal. Judgment and thought content normal.  Nursing note and vitals reviewed.   BP 116/62 (BP Location: Left Arm, Patient Position: Sitting, Cuff Size: Small)    Pulse 90    Temp 97.9 F (36.6 C) (Oral)    Ht '5\' 4"'$  (1.626 m)    Wt 172 lb 3.2 oz (78.1 kg)    SpO2 100%    BMI 29.56 kg/m  Wt Readings from Last 3 Encounters:  09/21/18 172 lb 3.2 oz (78.1 kg)  09/09/18 180 lb (81.6 kg)  06/21/18 178 lb (80.7 kg)     Health Maintenance Due  Topic Date Due   PNEUMOCOCCAL POLYSACCHARIDE VACCINE AGE 5-64 HIGH RISK  06/09/1963   OPHTHALMOLOGY EXAM  06/09/1971   COLONOSCOPY  06/09/2011    There are no preventive care reminders to display for this patient.  Lab Results  Component Value Date   TSH 0.514 06/21/2018   Lab Results  Component Value Date   WBC 9.5 06/21/2018   HGB 14.6 06/21/2018   HCT 41.6 06/21/2018   MCV 83 06/21/2018   PLT 329 06/21/2018  Lab Results  Component Value Date   NA 139 06/21/2018   K 4.5 06/21/2018   CO2 18 (L) 06/21/2018   GLUCOSE 202 (H) 06/21/2018   BUN 12 06/21/2018   CREATININE 0.87  06/21/2018   BILITOT 0.3 06/21/2018   ALKPHOS 90 06/21/2018   AST 16 06/21/2018   ALT 17 06/21/2018   PROT 7.1 06/21/2018   ALBUMIN 4.6 06/21/2018   CALCIUM 10.3 (H) 06/21/2018   ANIONGAP 9 09/20/2016   Lab Results  Component Value Date   CHOL 161 07/01/2017   Lab Results  Component Value Date   HDL 29 (L) 07/01/2017   Lab Results  Component Value Date   LDLCALC 98 07/01/2017   Lab Results  Component Value Date   TRIG 169 (H) 07/01/2017   Lab Results  Component Value Date   CHOLHDL 5.8 (H) 06/01/2017   Lab Results  Component Value Date   HGBA1C 12.1 (A) 09/21/2018      Assessment & Plan:   1. Type 2 diabetes mellitus without complication, without long-term current use of insulin (HCC) Worsened. Lantus dose increased to 40 units QHS.  - POCT glycosylated hemoglobin (Hb A1C) - POCT urinalysis dipstick - POCT glucose (manual entry) - insulin glargine (LANTUS) 100 UNIT/ML injection; Inject 0.4 mLs (40 Units total) into the skin daily.  Dispense: 10 mL; Refill: 11  2. Hemoglobin A1C greater than 9%, indicating poor diabetic control Hgb A1c increased at 12.1 today, from 10.3 on 08/2018. He will continue to decrease foods/beverages high in sugars and carbs and follow Heart Healthy or DASH diet. Increase physical activity to at least 30 minutes cardio exercise daily.   3. Essential hypertension The current medical regimen is effective; blood pressure is stable at 116/62 today; continue present plan and medications as prescribed. He will continue to decrease high sodium intake, excessive alcohol intake, increase potassium intake, smoking cessation, and increase physical activity of at least 30 minutes of cardio activity daily. He will continue to follow Heart Healthy or DASH diet.  4. S/P stroke due to cerebrovascular disease Stable. No signs or symptoms of recurrence noted or reported.   5. Left-sided weakness He will continue to use cane for assistance with ambulation.    6. Vitamin D deficiency  7. Chronic anticoagulation Continue Eliquis as prescribed.   8. Follow up He will follow up in 3 months.   Current Outpatient Medications on File Prior to Visit  Medication Sig Dispense Refill   amLODipine (NORVASC) 5 MG tablet Take 5 mg by mouth daily.     apixaban (ELIQUIS) 5 MG TABS tablet Take 1 tablet (5 mg total) by mouth 2 (two) times daily. 180 tablet 1   blood glucose meter kit and supplies KIT Dispense based on patient and insurance preference. Use up to four times daily as directed. (FOR ICD-10. E.11.8). 1 each 0   folic acid (FOLVITE) 1 MG tablet Take 1 tablet (1 mg total) by mouth daily. 90 tablet 1   gemfibrozil (LOPID) 600 MG tablet TAKE 1 TABLET (600 MG TOTAL) BY MOUTH 2 (TWO) TIMES DAILY BEFORE A MEAL. 180 tablet 1   Insulin Pen Needle (NOVOFINE) 30G X 8 MM MISC Inject 10 each into the skin as needed. 200 each 2   Insulin Syringe-Needle U-100 28G X 1/2" 1 ML MISC Use to administer insulin once daily. e11.8 300 each 0   lisinopril (PRINIVIL,ZESTRIL) 20 MG tablet Take 1.5 tablets (30 mg total) by mouth daily. 60 tablet 2   Magnesium  250 MG TABS Take 1 tablet by mouth daily.     metFORMIN (GLUCOPHAGE) 500 MG tablet Take 2 tablets (1,000 mg total) by mouth 2 (two) times daily with a meal. 180 tablet 1   Omega-3 Fatty Acids (FISH OIL) 600 MG CAPS Take 2 capsules by mouth 2 (two) times daily.     pantoprazole (PROTONIX) 40 MG tablet Take 1 tablet (40 mg total) by mouth daily. 30 tablet 2   pravastatin (PRAVACHOL) 20 MG tablet Take 1 tablet (20 mg total) by mouth daily. 90 tablet 1   sitaGLIPtin (JANUVIA) 25 MG tablet Take 1 tablet (25 mg total) by mouth daily. 90 tablet 3   Thiamine HCl (VITAMIN B-1) 250 MG tablet Take 250 mg by mouth daily.     TRUE METRIX BLOOD GLUCOSE TEST test strip USE AS DIRECTED UP TO 4 TIMES DAILY 200 each 2   TRUEPLUS INSULIN SYRINGE 30G X 5/16" 1 ML MISC USE AS DIRECTED TO ADMINISTER INSULIN DAILY. 300 each  0   TRUEPLUS INSULIN SYRINGE 30G X 5/16" 1 ML MISC 30 mg.     TRUEPLUS LANCETS 30G MISC USE AS DIRECTED UP TO FOUR TIMES DAILY 200 each 0   VITAMIN D PO Take 250 mg by mouth daily.     Vitamin D, Ergocalciferol, (DRISDOL) 1.25 MG (50000 UT) CAPS capsule Take 1 capsule (50,000 Units total) by mouth every 7 (seven) days. 5 capsule 3   No current facility-administered medications on file prior to visit.     Referral Orders  No referral(s) requested today    Kathe Becton,  MSN, FNP-BC Patient [place], Christine [phone]  Problem List Items Addressed This Visit      Cardiovascular and Mediastinum   Essential hypertension     Other   Chronic anticoagulation after embolic stroke   Vitamin D deficiency    Other Visit Diagnoses    Type 2 diabetes mellitus without complication, without long-term current use of insulin ([place])    -  Primary   Relevant Medications   insulin glargine (LANTUS) 100 UNIT/ML injection   Other Relevant Orders   POCT glycosylated hemoglobin (Hb A1C) (Completed)   POCT urinalysis dipstick (Completed)   POCT glucose (manual entry) (Completed)   Hemoglobin A1C greater than 9%, indicating poor diabetic control       Relevant Medications   insulin glargine (LANTUS) 100 UNIT/ML injection   S/P stroke due to cerebrovascular disease       Left-sided weakness       Follow up          Meds ordered this encounter  Medications   insulin glargine (LANTUS) 100 UNIT/ML injection    Sig: Inject 0.4 mLs (40 Units total) into the skin daily.    Dispense:  10 mL    Refill:  11    Follow-up: Return in about 3 months (around 12/22/2018).    Azzie Glatter, FNP

## 2018-09-22 ENCOUNTER — Other Ambulatory Visit: Payer: Self-pay

## 2018-09-22 DIAGNOSIS — E119 Type 2 diabetes mellitus without complications: Secondary | ICD-10-CM

## 2018-09-22 DIAGNOSIS — K219 Gastro-esophageal reflux disease without esophagitis: Secondary | ICD-10-CM

## 2018-09-22 MED ORDER — PANTOPRAZOLE SODIUM 40 MG PO TBEC: 40.0000 mg | DELAYED_RELEASE_TABLET | Freq: Every day | ORAL | 2 refills | Status: DC

## 2018-09-22 MED ORDER — INSULIN GLARGINE 100 UNIT/ML ~~LOC~~ SOLN: 40.0000 [IU] | Freq: Every day | SUBCUTANEOUS | 11 refills | Status: DC

## 2018-09-22 MED FILL — ?PANTOPRAZOLE SO DR 40MG TA: 40 | 30 days supply | Qty: 30 | Fill #0

## 2018-09-23 ENCOUNTER — Telehealth: Payer: Self-pay | Admitting: Internal Medicine

## 2018-09-23 NOTE — Telephone Encounter (Signed)
Spoke with patient regarding Covid-19 screening questions °Covid-19 Screening Questions ° °Do you now or have you had a fever in the last 14 days?   no   ° °Do you have any respiratory symptoms of shortness of breath or cough now or in the last 14 days?  no   ° °Do you have any family members or close contacts with diagnosed or suspected Covid-19 in the past 14 days?  no ° °Have you been tested for Covid-19 and found to be positive?  no  ° ° °

## 2018-09-27 ENCOUNTER — Ambulatory Visit (AMBULATORY_SURGERY_CENTER): Payer: Self-pay | Admitting: Internal Medicine

## 2018-09-27 ENCOUNTER — Other Ambulatory Visit: Payer: Self-pay | Admitting: Family Medicine

## 2018-09-27 ENCOUNTER — Encounter: Payer: Self-pay | Admitting: Internal Medicine

## 2018-09-27 ENCOUNTER — Other Ambulatory Visit: Payer: Self-pay

## 2018-09-27 VITALS — BP 102/69 | HR 67 | Temp 98.4°F | Resp 20 | Ht 64.0 in | Wt 180.0 lb

## 2018-09-27 DIAGNOSIS — D123 Benign neoplasm of transverse colon: Secondary | ICD-10-CM

## 2018-09-27 DIAGNOSIS — Z86010 Personal history of colonic polyps: Secondary | ICD-10-CM

## 2018-09-27 DIAGNOSIS — D122 Benign neoplasm of ascending colon: Secondary | ICD-10-CM

## 2018-09-27 DIAGNOSIS — D120 Benign neoplasm of cecum: Secondary | ICD-10-CM

## 2018-09-27 DIAGNOSIS — D125 Benign neoplasm of sigmoid colon: Secondary | ICD-10-CM

## 2018-09-27 DIAGNOSIS — D124 Benign neoplasm of descending colon: Secondary | ICD-10-CM

## 2018-09-27 MED ORDER — SODIUM CHLORIDE 0.9 % IV SOLN: 500.0000 mL | Freq: Once | INTRAVENOUS | Status: DC

## 2018-09-27 MED FILL — ?AMLODIPINE BESYLATE 5MG TA: 5 | 30 days supply | Qty: 15 | Fill #3

## 2018-09-27 MED FILL — ?METFORMIN HCL 500MG TABLET: 500 | 30 days supply | Qty: 120 | Fill #2

## 2018-09-27 MED FILL — ?FOLIC ACID 1MG TAB: 1 | 30 days supply | Qty: 30 | Fill #5

## 2018-09-27 MED FILL — GEMFIBROZIL 600 MG TAB: 600 | 30 days supply | Qty: 60 | Fill #3

## 2018-09-27 MED FILL — VIT D2 1.25 MG (50,000 UNIT: 1.25 MG | 21 days supply | Qty: 3 | Fill #2

## 2018-09-27 NOTE — Progress Notes (Signed)
Called to room to assist during endoscopic procedure.  Patient ID and intended procedure confirmed with present staff. Received instructions for my participation in the procedure from the performing physician.  

## 2018-09-27 NOTE — Patient Instructions (Addendum)
I found and removed 5 small polyps. Nothing looks like cancer. They will be tested. I will let you know pathology results and when to have another routine colonoscopy by mail and/or My Chart.  You also have a condition called diverticulosis - common and not usually a problem. Please read the handout provided.  Restart Eliquis tomorrow.  I appreciate the opportunity to care for you. Gatha Mayer, MD, FACG    YOU HAD AN ENDOSCOPIC PROCEDURE TODAY AT [facility]:   Refer to the procedure report that was given to you for any specific questions about what was found during the examination.  If the procedure report does not answer your questions, please call your gastroenterologist to clarify.  If you requested that your care partner not be given the details of your procedure findings, then the procedure report has been included in a sealed envelope for you to review at your convenience later.  YOU SHOULD EXPECT: Some feelings of bloating in the abdomen. Passage of more gas than usual.  Walking can help get rid of the air that was put into your GI tract during the procedure and reduce the bloating. If you had a lower endoscopy (such as a colonoscopy or flexible sigmoidoscopy) you may notice spotting of blood in your stool or on the toilet paper. If you underwent a bowel prep for your procedure, you may not have a normal bowel movement for a few days.  Please Note:  You might notice some irritation and congestion in your nose or some drainage.  This is from the oxygen used during your procedure.  There is no need for concern and it should clear up in a day or so.  SYMPTOMS TO REPORT IMMEDIATELY:   Following lower endoscopy (colonoscopy or flexible sigmoidoscopy):  Excessive amounts of blood in the stool  Significant tenderness or worsening of abdominal pains  Swelling of the abdomen that is new, acute  Fever of 100F or higher    For urgent or emergent issues, a  gastroenterologist can be reached at any hour by calling [phone].   DIET:  We do recommend a small meal at first, but then you may proceed to your regular diet.  Drink plenty of fluids but you should avoid alcoholic beverages for 24 hours.  ACTIVITY:  You should plan to take it easy for the rest of today and you should NOT DRIVE or use heavy machinery until tomorrow (because of the sedation medicines used during the test).    FOLLOW UP: Our staff will call the number listed on your records 48-72 hours following your procedure to check on you and address any questions or concerns that you may have regarding the information given to you following your procedure. If we do not reach you, we will leave a message.  We will attempt to reach you two times.  During this call, we will ask if you have developed any symptoms of COVID 19. If you develop any symptoms (ie: fever, flu-like symptoms, shortness of breath, cough etc.) before then, please call [phone].  If you test positive for Covid 19 in the 2 weeks post procedure, please call and report this information to [place].    If any biopsies were taken you will be contacted by phone or by letter within the next 1-3 weeks.  Please call us at [phone] if you have not heard about the biopsies in 3 weeks.    SIGNATURES/CONFIDENTIALITY: You and/or your care partner have  signed paperwork which will be entered into your electronic medical record.  These signatures attest to the fact that that the information above on your After Visit Summary has been reviewed and is understood.  Full responsibility of the confidentiality of this discharge information lies with you and/or your care-partner.    Handouts were given to you on polyps, diverticulosis, and hemorrhoids. Resume your ELIQUIS at prior dose tomorrow. Your blood sugar was 95 in the recovery room. You may resume your other current medications today. Await biopsy results. Please call if any  questions or concerns.

## 2018-09-27 NOTE — Progress Notes (Signed)
No problems noted in the recovery room. maw 

## 2018-09-27 NOTE — Progress Notes (Signed)
PT taken to PACU. Monitors in place. VSS. Report given to RN. 

## 2018-09-27 NOTE — Progress Notes (Signed)
Pt's states no medical or surgical changes since previsit or office visit. 

## 2018-09-27 NOTE — Progress Notes (Signed)
Courtney Washington took temp and Judy Branson took vitals. 

## 2018-09-27 NOTE — Op Note (Signed)
Caleb Hernandez Patient Name: Caleb Hernandez Procedure Date: 09/27/2018 9:08 AM MRN: [id number] Endoscopist: Gatha Mayer , MD Age: 58 Referring MD:  Date of Birth: May 11, 1960 Gender: Male Account #: [id number] Procedure:                Colonoscopy Indications:              Surveillance: Personal history of adenomatous                            polyps on last colonoscopy > 5 years ago Medicines:                Propofol per Anesthesia, Monitored Anesthesia Care Procedure:                Pre-Anesthesia Assessment:                           - Prior to the procedure, a History and Physical                            was performed, and patient medications and                            allergies were reviewed. The patient's tolerance of                            previous anesthesia was also reviewed. The risks                            and benefits of the procedure and the sedation                            options and risks were discussed with the patient.                            All questions were answered, and informed consent                            was obtained. Prior Anticoagulants: The patient                            last took Eliquis (apixaban) 2 days prior to the                            procedure. ASA Grade Assessment: III - A patient                            with severe systemic disease. After reviewing the                            risks and benefits, the patient was deemed in                            satisfactory condition to undergo the procedure.  After obtaining informed consent, the colonoscope                            was passed under direct vision. Throughout the                            procedure, the patient's blood pressure, pulse, and                            oxygen saturations were monitored continuously. The                            Colonoscope was introduced through the anus and                             advanced to the the cecum, identified by                            appendiceal orifice and ileocecal valve. The                            colonoscopy was performed without difficulty. The                            patient tolerated the procedure well. The quality                            of the bowel preparation was excellent. The bowel                            preparation used was Miralax via split dose                            instruction. The ileocecal valve, appendiceal                            orifice, and rectum were photographed. Scope In: 9:16:52 AM Scope Out: 9:35:37 AM Scope Withdrawal Time: 0 hours 16 minutes 39 seconds  Total Procedure Duration: 0 hours 18 minutes 45 seconds  Findings:                 The perianal and digital rectal examinations were                            normal. Pertinent negatives include normal prostate                            (size, shape, and consistency).                           Five sessile polyps were found in the sigmoid                            colon, descending colon, transverse colon,  ascending colon and cecum. The polyps were                            diminutive in size. These polyps were removed with                            a cold snare. Resection and retrieval were                            complete. Verification of patient identification                            for the specimen was done. Estimated blood loss was                            minimal.                           Multiple diverticula were found in the transverse                            colon, ascending colon and cecum.                           External and internal hemorrhoids were found during                            retroflexion.                           The exam was otherwise without abnormality on                            direct and retroflexion views. Complications:            No immediate  complications. Estimated Blood Loss:     Estimated blood loss was minimal. Impression:               - Five diminutive polyps in the sigmoid colon, in                            the descending colon, in the transverse colon, in                            the ascending colon and in the cecum, removed with                            a cold snare. Resected and retrieved.                           - Diverticulosis in the transverse colon, in the                            ascending colon and in the cecum.                           -  External and internal hemorrhoids.                           - The examination was otherwise normal on direct                            and retroflexion views.                           - Personal history of colonic polyps. 2 subcm                            adenomas 2013 Recommendation:           - Patient has a contact number available for                            emergencies. The signs and symptoms of potential                            delayed complications were discussed with the                            patient. Return to normal activities tomorrow.                            Written discharge instructions were provided to the                            patient.                           - Resume previous diet.                           - Continue present medications.                           - Resume Eliquis (apixaban) at prior dose tomorrow.                           - Repeat colonoscopy is recommended for                            surveillance. The colonoscopy date will be                            determined after pathology results from today's                            exam become available for review. Gatha Mayer, MD 09/27/2018 9:46:23 AM This report has been signed electronically.

## 2018-09-28 ENCOUNTER — Telehealth: Payer: Self-pay

## 2018-09-28 MED ORDER — LISINOPRIL 20 MG PO TABS: 30.0000 mg | ORAL_TABLET | Freq: Every day | ORAL | 2 refills | Status: DC

## 2018-09-28 MED FILL — LISINOPRIL 20 MG TABLET: 20 | 30 days supply | Qty: 45 | Fill #0

## 2018-09-28 NOTE — Telephone Encounter (Signed)
Medication sent to pharmacy  

## 2018-09-29 ENCOUNTER — Telehealth: Payer: Self-pay | Admitting: *Deleted

## 2018-09-29 ENCOUNTER — Encounter: Payer: Self-pay | Admitting: Internal Medicine

## 2018-09-29 NOTE — Progress Notes (Signed)
3+ adenomas recall 2023

## 2018-09-29 NOTE — Telephone Encounter (Signed)
1. Have you developed a fever since your procedure? no  2.   Have you had an respiratory symptoms (SOB or cough) since your procedure? no  3.   Have you tested positive for COVID 19 since your procedure no  4.   Have you had any family members/close contacts diagnosed with the COVID 19 since your procedure?  no   If yes to any of these questions please route to Joylene John, RN and Alphonsa Gin, Therapist, sports.  Follow up Call-  Call back number 09/27/2018  Post procedure Call Back phone  # [phone]  Permission to leave phone message Yes     Patient questions:  Do you have a fever, pain , or abdominal swelling? No. Pain Score  0 *  Have you tolerated food without any problems? Yes.    Have you been able to return to your normal activities? Yes.    Do you have any questions about your discharge instructions: Diet   No. Medications  No. Follow up visit  No.  Do you have questions or concerns about your Care? No.  Actions: * If pain score is 4 or above: No action needed, pain <4.

## 2018-10-25 ENCOUNTER — Other Ambulatory Visit: Payer: Self-pay | Admitting: Family Medicine

## 2018-10-25 DIAGNOSIS — E559 Vitamin D deficiency, unspecified: Secondary | ICD-10-CM

## 2018-10-25 MED FILL — PANTOPRAZOLE SOD DR 40 MG T: 40 | 30 days supply | Qty: 30 | Fill #1

## 2018-10-25 MED FILL — LISINOPRIL 20 MG TABLET: 20 | 30 days supply | Qty: 45 | Fill #1

## 2018-10-25 MED FILL — FOLIC ACID 1 MG TABS: 1 | 30 days supply | Qty: 30 | Fill #0

## 2018-10-25 MED FILL — TRUEPLUS SYR 1ML 30GX5/16: 30G X 5/16" | 30 days supply | Qty: 100 | Fill #1

## 2018-10-25 MED FILL — metFORMIN HCL 500 MG TABS: 500 | 30 days supply | Qty: 120 | Fill #0

## 2018-10-25 MED FILL — VIT D2 1.25 MG (50,000 UNIT: 1.25 MG | 84 days supply | Qty: 12 | Fill #0

## 2018-10-25 MED FILL — GEMFIBROZIL 600 MG TAB: 600 | 30 days supply | Qty: 60 | Fill #4

## 2018-10-26 ENCOUNTER — Other Ambulatory Visit: Payer: Self-pay | Admitting: Family Medicine

## 2018-10-26 MED ORDER — FOLIC ACID 1 MG PO TABS: 1.0000 mg | ORAL_TABLET | Freq: Every day | ORAL | 1 refills | Status: DC

## 2018-10-26 MED ORDER — AMLODIPINE BESYLATE 5 MG PO TABS: 5.0000 mg | ORAL_TABLET | Freq: Every day | ORAL | 2 refills | Status: DC

## 2018-10-26 MED FILL — AMLODIPINE BESYLATE 5 MG TA: 5 | 30 days supply | Qty: 30 | Fill #0

## 2018-10-26 MED FILL — PRAVASTATIN SODIUM 20 MG TA: 20 | 30 days supply | Qty: 30 | Fill #0

## 2018-10-26 NOTE — Telephone Encounter (Signed)
Medication sent to pharmacy  

## 2018-10-29 ENCOUNTER — Other Ambulatory Visit: Payer: Self-pay

## 2018-10-29 MED ORDER — TRUE METRIX BLOOD GLUCOSE TEST VI STRP: ORAL_STRIP | 2 refills | Status: AC

## 2018-10-29 NOTE — Telephone Encounter (Signed)
Medication sent to pharmacy  

## 2018-11-22 ENCOUNTER — Ambulatory Visit: Payer: Self-pay | Attending: Family Medicine

## 2018-11-22 ENCOUNTER — Other Ambulatory Visit: Payer: Self-pay

## 2018-11-22 ENCOUNTER — Telehealth: Payer: Self-pay | Admitting: Family Medicine

## 2018-11-22 NOTE — Telephone Encounter (Signed)
I called Pt since he has a Tele visit with financial @ 10;30, LVM to remind him of the appt

## 2018-11-24 MED FILL — FOLIC ACID 1 MG TABS: 1 | 30 days supply | Qty: 30 | Fill #1

## 2018-11-24 MED FILL — GEMFIBROZIL 600 MG TAB: 600 | 30 days supply | Qty: 60 | Fill #5

## 2018-11-24 MED FILL — LISINOPRIL 20 MG TABLET: 20 | 30 days supply | Qty: 45 | Fill #2

## 2018-11-24 MED FILL — PANTOPRAZOLE SOD DR 40 MG T: 40 | 30 days supply | Qty: 30 | Fill #2

## 2018-11-24 MED FILL — PRAVASTATIN SODIUM 20 MG TA: 20 | 30 days supply | Qty: 30 | Fill #1

## 2018-11-24 MED FILL — metFORMIN HCL 500 MG TABS: 500 | 30 days supply | Qty: 120 | Fill #1

## 2018-11-24 MED FILL — AMLODIPINE BESYLATE 5 MG TA: 5 | 30 days supply | Qty: 30 | Fill #1

## 2018-12-01 MED FILL — $LANTUS 100 UNITS/ML VIAL: 100 | 75 days supply | Qty: 30 | Fill #1

## 2018-12-21 ENCOUNTER — Ambulatory Visit (INDEPENDENT_AMBULATORY_CARE_PROVIDER_SITE_OTHER): Payer: Self-pay | Admitting: Family Medicine

## 2018-12-21 ENCOUNTER — Other Ambulatory Visit: Payer: Self-pay

## 2018-12-21 VITALS — BP 134/76 | HR 111 | Temp 98.4°F | Ht 64.0 in | Wt 178.8 lb

## 2018-12-21 DIAGNOSIS — R7309 Other abnormal glucose: Secondary | ICD-10-CM

## 2018-12-21 DIAGNOSIS — Z23 Encounter for immunization: Secondary | ICD-10-CM

## 2018-12-21 DIAGNOSIS — Z8673 Personal history of transient ischemic attack (TIA), and cerebral infarction without residual deficits: Secondary | ICD-10-CM

## 2018-12-21 DIAGNOSIS — Z09 Encounter for follow-up examination after completed treatment for conditions other than malignant neoplasm: Secondary | ICD-10-CM

## 2018-12-21 DIAGNOSIS — E119 Type 2 diabetes mellitus without complications: Secondary | ICD-10-CM

## 2018-12-21 DIAGNOSIS — R531 Weakness: Secondary | ICD-10-CM

## 2018-12-21 DIAGNOSIS — I10 Essential (primary) hypertension: Secondary | ICD-10-CM

## 2018-12-21 LAB — POCT URINALYSIS DIPSTICK
Bilirubin, UA: NEGATIVE
Glucose, UA: POSITIVE — AB
Ketones, UA: NEGATIVE
Nitrite, UA: NEGATIVE
Protein, UA: POSITIVE — AB
Spec Grav, UA: >=1.03 — AB (ref 1.010–1.025)
Urobilinogen, UA: 0.2 E.U./dL
pH, UA: 5 (ref 5.0–8.0)

## 2018-12-21 LAB — POCT GLYCOSYLATED HEMOGLOBIN (HGB A1C)
HbA1c POC (<> result, manual entry): 9.8 % (ref 4.0–5.6)
HbA1c, POC (controlled diabetic range): 9.8 % — AB (ref 0.0–7.0)
HbA1c, POC (prediabetic range): 9.8 % — AB (ref 5.7–6.4)
Hemoglobin A1C: 9.8 % — AB (ref 4.0–5.6)

## 2018-12-21 LAB — POCT GLUCOSE (DEVICE FOR HOME USE)
Glucose Fasting, POC: 240 mg/dL — AB (ref 70–99)
POC Glucose: 240 mg/dl — AB (ref 70–99)

## 2018-12-21 MED ORDER — LANTUS SOLOSTAR 100 UNIT/ML ~~LOC~~ SOPN: 50.0000 [IU] | PEN_INJECTOR | Freq: Every day | SUBCUTANEOUS | 99 refills | Status: DC

## 2018-12-21 NOTE — Progress Notes (Signed)
Patient [name]   Established Patient Office Visit  Subjective:  Patient ID: Caleb Hernandez, male    DOB: November 22, 1960  Age: 58 y.o. MRN: [id number]  CC:  Chief Complaint  Patient presents with   Follow-up    follow up , pt has no other concerns     HPI Caleb Hernandez is a 58presents for Follow Up today.   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 ([place]) 09/26/2016   Corns and callosity 08/28/2012   Diabetes mellitus without complication ([place])    Diverticulitis 09/17/2016   Early cataracts, bilateral 01/03/2014   Encounter for long-term (current) use of other medications 08/28/2012   Essential hypertension 09/17/2016   First degree heart block 08/28/2012   GI bleed 09/17/2016   High cholesterol    Hx of adenomatous polyps of colon 09/09/2018   Hypercholesterolemia 04/13/2013   Hypertension    Increased band cell count 09/26/2016   Male hypogonadism 11/16/2013   Nondependent alcohol abuse 09/17/2016   Onychomycosis of toenail 12/18/2014   Positive RPR test 09/27/2016   Premature beats 08/28/2012   Proteinuria 04/13/2013   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/2013   Tinea pedis 08/28/2012   Tinea pedis of both feet 12/18/2014   Tobacco use 09/17/2016   Type 2 diabetes mellitus, without long-term current use of insulin ([place]) 09/17/2016   Undiagnosed cardiac murmurs 04/20/2012   Vitamin D deficiency 04/13/2013   Current Status: Since his last office visit, he is doing well with no complaints. His most recent normal range of preprandial blood glucose levels have been between 240-245. He has seen low range of 245 and high of 280-290 since his last office visit. He has increase eating. He denies fatigue, frequent  urination, blurred vision, excessive hunger, excessive thirst, weight gain, weight loss, and poor wound healing. He continues to check his feet regularly. He denies visual changes, chest pain, cough, shortness of breath, heart palpitations, and falls. He has occasional headaches and dizziness with position changes. Denies severe headaches, confusion, seizures, double vision, and blurred vision, nausea and vomiting. He denies fevers, chills, recent infections, weight loss, and night sweats. No reports of GI problems such as diarrhea, and constipation. He has no reports of blood in stools, dysuria and hematuria. No depression or anxiety reported. He denies pain today.   Past Surgical History:  Procedure Laterality Date   BACK SURGERY     COLONOSCOPY  2013   LOOP RECORDER INSERTION N/A 10/17/2016   Procedure: Loop Recorder Insertion;  Surgeon: Evans Lance, MD;  Location: [facility];  Service: Cardiovascular;  Laterality: N/A;   PACEMAKER IMPLANT     June 2018 or June 2019    Family History  Problem Relation Age of Onset   Heart disease Brother    Diabetes Mother    Hypertension Mother    Diabetes Father    Hypercalcemia Father    Stomach cancer Brother    AAA (abdominal aortic aneurysm) Brother    Colon cancer Neg Hx    Esophageal cancer Neg Hx    Rectal cancer Neg Hx     Social History   Socioeconomic History   Marital status: Single    Spouse name: Not on file   Number of children:  Not on file   Years of education: Not on file   Highest education level: Not on file  Occupational History   Not on file  Social Needs   Financial resource strain: Not on file   Food insecurity    Worry: Not on file    Inability: Not on file   Transportation needs    Medical: Not on file    Non-medical: Not on file  Tobacco Use   Smoking status: Current Every Day Smoker    Types: Cigarettes   Smokeless tobacco: Never Used   Tobacco comment: Smoke about 5  cigarrettes per day  Substance and Sexual Activity   Alcohol use: Yes    Comment: 40 oz per day/ former   Drug use: No   Sexual activity: Yes    Partners: Female  Lifestyle   Physical activity    Days per week: Not on file    Minutes per session: Not on file   Stress: Not on file  Relationships   Social connections    Talks on phone: Not on file    Gets together: Not on file    Attends religious service: Not on file    Active member of club or organization: Not on file    Attends meetings of clubs or organizations: Not on file    Relationship status: Not on file   Intimate partner violence    Fear of current or ex partner: Not on file    Emotionally abused: Not on file    Physically abused: Not on file    Forced sexual activity: Not on file  Other Topics Concern   Not on file  Social History Narrative   Single, unemployed   Uninsured   Hx EtOH   Smoker   No drugs    Outpatient Medications Prior to Visit  Medication Sig Dispense Refill   amLODipine (NORVASC) 5 MG tablet Take 1 tablet (5 mg total) by mouth daily. 30 tablet 2   apixaban (ELIQUIS) 5 MG TABS tablet Take 1 tablet (5 mg total) by mouth 2 (two) times daily. 180 tablet 1   blood glucose meter kit and supplies KIT Dispense based on patient and insurance preference. Use up to four times daily as directed. (FOR ICD-10. E.11.8). 1 each 0   folic acid (FOLVITE) 1 MG tablet Take 1 tablet (1 mg total) by mouth daily. 90 tablet 1   gemfibrozil (LOPID) 600 MG tablet TAKE 1 TABLET (600 MG TOTAL) BY MOUTH 2 (TWO) TIMES DAILY BEFORE A MEAL. 180 tablet 1   glucose blood (TRUE METRIX BLOOD GLUCOSE TEST) test strip USE AS DIRECTED UP TO 4 TIMES DAILY 200 each 2   Insulin Pen Needle (NOVOFINE) 30G X 8 MM MISC Inject 10 each into the skin as needed. 200 each 2   Insulin Syringe-Needle U-100 28G X 1/2" 1 ML MISC Use to administer insulin once daily. e11.8 300 each 0   lisinopril (ZESTRIL) 20 MG tablet Take 1.5  tablets (30 mg total) by mouth daily. 60 tablet 2   Magnesium 250 MG TABS Take 1 tablet by mouth daily.     metFORMIN (GLUCOPHAGE) 500 MG tablet TAKE 2 TABLETS (1,000 MG TOTAL) BY MOUTH 2 (TWO) TIMES DAILY WITH A MEAL. 180 tablet 1   Omega-3 Fatty Acids (FISH OIL) 600 MG CAPS Take 2 capsules by mouth 2 (two) times daily.     pantoprazole (PROTONIX) 40 MG tablet Take 1 tablet (40 mg total) by mouth daily. [place]  tablet 2   pravastatin (PRAVACHOL) 20 MG tablet TAKE 1 TABLET (20 MG TOTAL) BY MOUTH DAILY. 90 tablet 1   sitaGLIPtin (JANUVIA) 25 MG tablet Take 1 tablet (25 mg total) by mouth daily. 90 tablet 3   TRUEPLUS INSULIN SYRINGE 30G X 5/16" 1 ML MISC USE AS DIRECTED TO ADMINISTER INSULIN DAILY. 300 each 0   TRUEPLUS INSULIN SYRINGE 30G X 5/16" 1 ML MISC 30 mg.     TRUEPLUS LANCETS 30G MISC USE AS DIRECTED UP TO FOUR TIMES DAILY 200 each 0   Vitamin D, Ergocalciferol, (DRISDOL) 1.25 MG (50000 UT) CAPS capsule TAKE 1 CAPSULE BY MOUTH EVERY 7 DAYS. 5 capsule 3   insulin glargine (LANTUS) 100 UNIT/ML injection Inject 0.4 mLs (40 Units total) into the skin daily. 10 mL 11   Thiamine HCl (VITAMIN B-1) 250 MG tablet Take 250 mg by mouth daily.     VITAMIN D PO Take 250 mg by mouth daily.     No facility-administered medications prior to visit.     No Known Allergies  ROS Review of Systems  Constitutional: Positive for fatigue.  HENT: Negative.   Eyes: Negative.   Respiratory: Negative.   Cardiovascular: Negative.   Gastrointestinal: Negative.   Endocrine: Negative.   Genitourinary: Negative.   Musculoskeletal: Negative.   Skin: Negative.   Allergic/Immunologic: Negative.   Neurological: Positive for dizziness (occasional) and headaches (occasional ).  Hematological: Negative.   Psychiatric/Behavioral: Negative.       Objective:    Physical Exam  Constitutional: He is oriented to person, place, and time. He appears well-developed and well-nourished.  HENT:  Head:  Normocephalic and atraumatic.  Eyes: Conjunctivae are normal.  Neck: Normal range of motion. Neck supple.  Cardiovascular: Normal rate, regular rhythm, normal heart sounds and intact distal pulses.  Pulmonary/Chest: Effort normal and breath sounds normal.  Abdominal: Soft. Bowel sounds are normal.  Musculoskeletal: Normal range of motion.  Neurological: He is alert and oriented to person, place, and time. He has normal reflexes.  Skin: Skin is warm and dry.  Psychiatric: He has a normal mood and affect. His behavior is normal. Thought content normal.  Nursing note and vitals reviewed.   BP 134/76 (BP Location: Right Arm, Patient Position: Sitting, Cuff Size: Normal)    Pulse (!) 111    Temp 98.4 F (36.9 C) (Oral)    Ht '5\' 4"'$  (1.626 m)    Wt 178 lb 12.8 oz (81.1 kg)    SpO2 100%    BMI 30.69 kg/m  Wt Readings from Last 3 Encounters:  12/21/18 178 lb 12.8 oz (81.1 kg)  09/27/18 180 lb (81.6 kg)  09/21/18 172 lb 3.2 oz (78.1 kg)     Health Maintenance Due  Topic Date Due   PNEUMOCOCCAL POLYSACCHARIDE VACCINE AGE 76-64 HIGH RISK  06/09/1963   OPHTHALMOLOGY EXAM  06/09/1971   INFLUENZA VACCINE  10/23/2018   FOOT EXAM  12/15/2018    There are no preventive care reminders to display for this patient.  Lab Results  Component Value Date   TSH 0.514 06/21/2018   Lab Results  Component Value Date   WBC 9.5 06/21/2018   HGB 14.6 06/21/2018   HCT 41.6 06/21/2018   MCV 83 06/21/2018   PLT 329 06/21/2018   Lab Results  Component Value Date   NA 139 06/21/2018   K 4.5 06/21/2018   CO2 18 (L) 06/21/2018   GLUCOSE 202 (H) 06/21/2018   BUN 12 06/21/2018   CREATININE  0.87 06/21/2018   BILITOT 0.3 06/21/2018   ALKPHOS 90 06/21/2018   AST 16 06/21/2018   ALT 17 06/21/2018   PROT 7.1 06/21/2018   ALBUMIN 4.6 06/21/2018   CALCIUM 10.3 (H) 06/21/2018   ANIONGAP 9 09/20/2016   Lab Results  Component Value Date   CHOL 161 07/01/2017   Lab Results  Component Value Date    HDL 29 (L) 07/01/2017   Lab Results  Component Value Date   LDLCALC 98 07/01/2017   Lab Results  Component Value Date   TRIG 169 (H) 07/01/2017   Lab Results  Component Value Date   CHOLHDL 5.8 (H) 06/01/2017   Lab Results  Component Value Date   HGBA1C 9.8 (A) 12/21/2018   HGBA1C 9.8 12/21/2018   HGBA1C 9.8 (A) 12/21/2018   HGBA1C 9.8 (A) 12/21/2018      Assessment & Plan:   1. Type 2 diabetes mellitus without complication, without long-term current use of insulin (HCC) We will increase Lantus to 50 units today.  - POCT Urinalysis Dipstick - Flu Vaccine QUAD 6+ mos PF IM (Fluarix Quad PF) - HgB A1c - POCT Glucose (Device for Home Use) - Insulin Glargine (LANTUS SOLOSTAR) 100 UNIT/ML Solostar Pen; Inject 50 Units into the skin daily.  Dispense: 5 pen; Refill: PRN  2. Hemoglobin A1C greater than 9%, indicating poor diabetic control Improved. Hgb A1c is decreased at 9.8 today, from 12.1 on 08/2018. He will continue medication as prescribed, to decrease foods/beverages high in sugars and carbs and follow Heart Healthy or DASH diet. Increase physical activity to at least 30 minutes cardio exercise daily.  3. Essential hypertension The current medical regimen is effective; blood pressure is stable at 134/76 today; continue present plan and medications as prescribed. He will continue to take medications as prescribed, to decrease high sodium intake, excessive alcohol intake, increase potassium intake, smoking cessation, and increase physical activity of at least 30 minutes of cardio activity daily. He will continue to follow Heart Healthy or DASH diet.  4. S/P stroke due to cerebrovascular disease No signs or symptoms of recurrence.   5. Left-sided weakness Stable. Not worsening.   6. Follow up He will follow up in 3 months.   Meds ordered this encounter  Medications   Insulin Glargine (LANTUS SOLOSTAR) 100 UNIT/ML Solostar Pen    Sig: Inject 50 Units into the skin daily.      Dispense:  5 pen    Refill:  PRN    Orders Placed This Encounter  Procedures   Flu Vaccine QUAD 6+ mos PF IM (Fluarix Quad PF)   POCT Urinalysis Dipstick   HgB A1c   POCT Glucose (Device for Home Use)   Referral Orders  No referral(s) requested today    Kathe Becton,  MSN, FNP-BC [address] [place], [place] [address] [phone]- fax   Problem List Items Addressed This Visit      Cardiovascular and Mediastinum   Essential hypertension     Endocrine   Hemoglobin A1C greater than 9%, indicating poor diabetic control   Relevant Medications   Insulin Glargine (LANTUS SOLOSTAR) 100 UNIT/ML Solostar Pen     Other   S/P stroke due to cerebrovascular disease    Other Visit Diagnoses    Type 2 diabetes mellitus without complication, without long-term current use of insulin (HCC)    -  Primary   Relevant Medications   Insulin Glargine (  LANTUS SOLOSTAR) 100 UNIT/ML Solostar Pen   Other Relevant Orders   POCT Urinalysis Dipstick (Completed)   Flu Vaccine QUAD 6+ mos PF IM (Fluarix Quad PF)   HgB A1c (Completed)   POCT Glucose (Device for Home Use) (Completed)   Left-sided weakness       Follow up          Meds ordered this encounter  Medications   Insulin Glargine (LANTUS SOLOSTAR) 100 UNIT/ML Solostar Pen    Sig: Inject 50 Units into the skin daily.    Dispense:  5 pen    Refill:  PRN    Follow-up: Return in about 3 months (around 03/22/2019).    Azzie Glatter, FNP

## 2018-12-27 MED FILL — LISINOPRIL 20 MG TABLET: 20 | 30 days supply | Qty: 45 | Fill #3

## 2018-12-27 MED FILL — PRAVASTATIN SODIUM 20 MG TA: 20 | 30 days supply | Qty: 30 | Fill #2

## 2018-12-27 MED FILL — metFORMIN HCL 500 MG TABS: 500 | 30 days supply | Qty: 120 | Fill #2

## 2018-12-27 MED FILL — FOLIC ACID 1 MG TABS: 1 | 30 days supply | Qty: 30 | Fill #2

## 2018-12-27 MED FILL — PANTOPRAZOLE SOD DR 40 MG T: 40 | 30 days supply | Qty: 30 | Fill #0

## 2018-12-27 MED FILL — AMLODIPINE BESYLATE 5 MG TA: 5 | 30 days supply | Qty: 30 | Fill #2

## 2018-12-28 ENCOUNTER — Other Ambulatory Visit: Payer: Self-pay | Admitting: Family Medicine

## 2018-12-28 ENCOUNTER — Telehealth: Payer: Self-pay | Admitting: Family Medicine

## 2018-12-28 DIAGNOSIS — E7800 Pure hypercholesterolemia, unspecified: Secondary | ICD-10-CM

## 2018-12-28 MED ORDER — GEMFIBROZIL 600 MG PO TABS: 600.0000 mg | ORAL_TABLET | Freq: Two times a day (BID) | ORAL | 1 refills | Status: DC

## 2018-12-29 ENCOUNTER — Other Ambulatory Visit: Payer: Self-pay

## 2018-12-29 ENCOUNTER — Other Ambulatory Visit: Payer: Self-pay | Admitting: Cardiology

## 2018-12-29 ENCOUNTER — Telehealth: Payer: Self-pay | Admitting: Family Medicine

## 2018-12-29 DIAGNOSIS — E7800 Pure hypercholesterolemia, unspecified: Secondary | ICD-10-CM

## 2018-12-29 MED ORDER — GEMFIBROZIL 600 MG PO TABS: 600.0000 mg | ORAL_TABLET | Freq: Two times a day (BID) | ORAL | 1 refills | Status: DC

## 2018-12-29 MED FILL — GEMFIBROZIL 600 MG TAB: 600 | 30 days supply | Qty: 60 | Fill #0

## 2018-12-29 NOTE — Telephone Encounter (Signed)
rx filled yesterday

## 2018-12-29 NOTE — Telephone Encounter (Signed)
Detailed vm left

## 2019-01-26 ENCOUNTER — Other Ambulatory Visit: Payer: Self-pay | Admitting: Family Medicine

## 2019-01-26 MED FILL — GEMFIBROZIL 600 MG TAB: 600 | 30 days supply | Qty: 60 | Fill #1

## 2019-01-26 MED FILL — PANTOPRAZOLE SOD DR 40 MG T: 40 | 30 days supply | Qty: 30 | Fill #1

## 2019-01-26 MED FILL — $LANTUS 100 UNITS/ML VIAL: 100 | 75 days supply | Qty: 30 | Fill #2

## 2019-01-26 MED FILL — FOLIC ACID 1 MG TABS: 1 | 30 days supply | Qty: 30 | Fill #3

## 2019-01-26 MED FILL — ?AMLODIPINE BESYLATE 5 MG T: 5 MG | 30 days supply | Qty: 30 | Fill #0

## 2019-01-26 MED FILL — VIT D2 1.25 MG (50,000 UNIT: 1.25 MG | 55 days supply | Qty: 8 | Fill #1

## 2019-01-26 MED FILL — LISINOPRIL 20 MG TABLET: 20 | 30 days supply | Qty: 45 | Fill #0

## 2019-01-26 MED FILL — ?METFORMIN HCL 500MG TABLET: 500 | 30 days supply | Qty: 120 | Fill #0

## 2019-02-21 MED FILL — PANTOPRAZOLE SOD DR 40 MG T: 40 | 30 days supply | Qty: 30 | Fill #2

## 2019-02-21 MED FILL — TRUEPLUS SYR 1ML 30GX5/16": 30G X 5/16" | 30 days supply | Qty: 100 | Fill #2

## 2019-02-21 MED FILL — GEMFIBROZIL 600 MG TAB: 600 | 30 days supply | Qty: 60 | Fill #2

## 2019-02-21 MED FILL — ?METFORMIN HCL 500MG TABLET: 500 | 30 days supply | Qty: 120 | Fill #1

## 2019-02-21 MED FILL — LISINOPRIL 20 MG TABLET: 20 | 30 days supply | Qty: 45 | Fill #1

## 2019-02-21 MED FILL — TRUEPLUS SYR 1ML 30GX5/16: 30G X 5/16" | 30 days supply | Qty: 100 | Fill #2

## 2019-02-21 MED FILL — ?FOLIC ACID 1MG TAB: 1 | 30 days supply | Qty: 30 | Fill #4

## 2019-02-21 MED FILL — ?AMLODIPINE BESYLATE 5 MG T: 5 MG | 30 days supply | Qty: 30 | Fill #1

## 2019-03-22 ENCOUNTER — Other Ambulatory Visit: Payer: Self-pay | Admitting: Family Medicine

## 2019-03-22 DIAGNOSIS — K219 Gastro-esophageal reflux disease without esophagitis: Secondary | ICD-10-CM

## 2019-03-22 MED FILL — LISINOPRIL 20 MG TABLET: 20 | 30 days supply | Qty: 45 | Fill #2

## 2019-03-22 MED FILL — GEMFIBROZIL 600 MG TAB: 600 | 30 days supply | Qty: 60 | Fill #3

## 2019-03-22 MED FILL — AMLODIPINE BESYLATE 5 MG TA: 5 | 30 days supply | Qty: 30 | Fill #2

## 2019-03-22 MED FILL — ?FOLIC ACID 1MG TAB: 1 | 30 days supply | Qty: 30 | Fill #5

## 2019-03-23 MED FILL — ?METFORMIN HCL 500MG TABLET: 500 | 30 days supply | Qty: 120 | Fill #0

## 2019-03-23 MED FILL — PANTOPRAZOLE SOD DR 40 MG T: 40 | 30 days supply | Qty: 30 | Fill #0

## 2019-04-07 MED FILL — !LANTUS 100 UNITS/ML VIAL: 100 | 25 days supply | Qty: 10 | Fill #3

## 2019-04-19 ENCOUNTER — Other Ambulatory Visit: Payer: Self-pay | Admitting: Internal Medicine

## 2019-04-22 ENCOUNTER — Other Ambulatory Visit: Payer: Self-pay | Admitting: Family Medicine

## 2019-04-22 MED FILL — PANTOPRAZOLE SOD DR 40 MG T: 40 | 30 days supply | Qty: 30 | Fill #1

## 2019-04-22 MED FILL — FOLIC ACID 1 MG TABS: 1 | 30 days supply | Qty: 30 | Fill #0

## 2019-04-22 MED FILL — GEMFIBROZIL 600 MG TAB: 600 | 30 days supply | Qty: 60 | Fill #4

## 2019-04-22 MED FILL — LISINOPRIL 20 MG TABLET: 20 | 30 days supply | Qty: 45 | Fill #0

## 2019-04-22 MED FILL — AMLODIPINE BESYLATE 5 MG TA: 5 | 30 days supply | Qty: 30 | Fill #0

## 2019-04-22 MED FILL — ?METFORMIN HCL 500MG TABLET: 500 | 30 days supply | Qty: 120 | Fill #1

## 2019-05-02 MED FILL — !LANTUS 100 UNITS/ML VIAL: 100 | 25 days supply | Qty: 10 | Fill #4

## 2019-05-12 IMAGING — MR MR ABDOMEN WO/W CM
9 of 18 series · 19 of 48 positions shown · IV contrast (multihance)
Comparison: 09/17/2016 abdominal sonogram and CT abdomen/pelvis.

CLINICAL DATA: Indeterminate low-attenuation regions in the left
liver lobe on recent CT study performed for epigastric abdominal
pain.

EXAM:
MRI ABDOMEN WITHOUT AND WITH CONTRAST
TECHNIQUE: Multiplanar multisequence MR imaging of the abdomen was performed
both before and after the administration of intravenous contrast.
CONTRAST:  20mL MULTIHANCE GADOBENATE DIMEGLUMINE 529 MG/ML IV SOLN

[Series 3: T2 fat-sat · axial · 5.0mm · 0.78mm/px · z∈[-119,+161]mm · 2 of 57 slices shown]
[im 1/57]
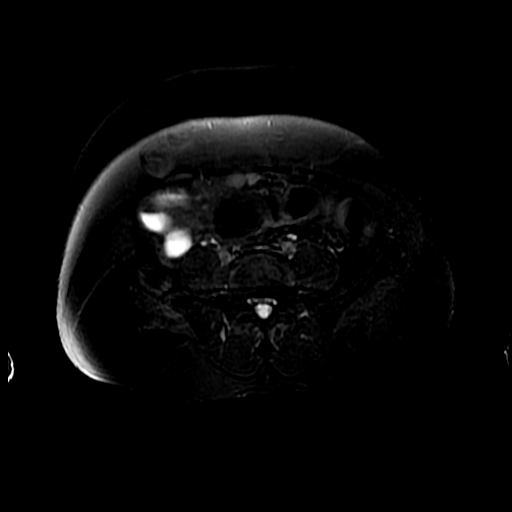
[im 57/57]
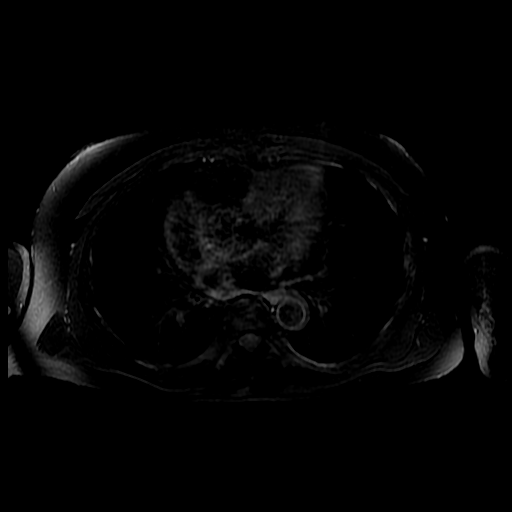

[Series 4: DWI b500 · axial · 6.0mm · 1.48mm/px · z∈[-114,+159]mm · 2 of 72 slices shown]
[im 1/72]
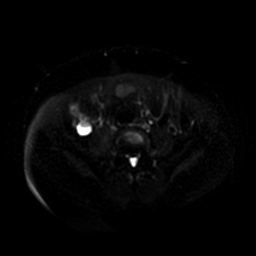
[im 72/72]
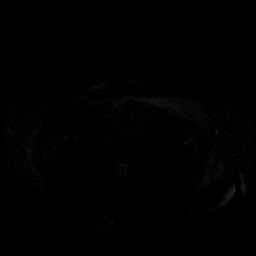

[Series 5: ax dualecho · axial · 5.0mm · 0.78mm/px · z∈[-119,+161]mm · 4 of 114 slices shown]
[im 1/114]
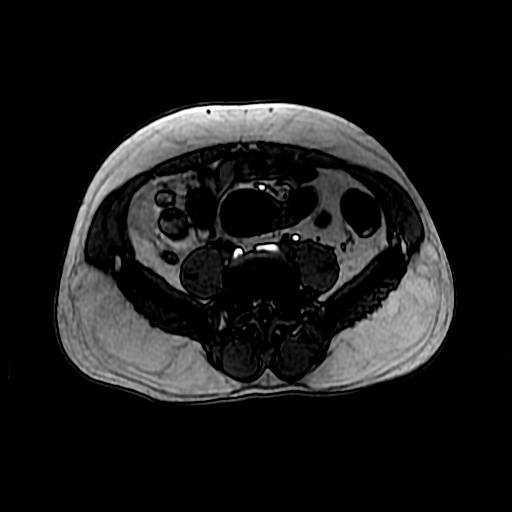
[im 38/114]
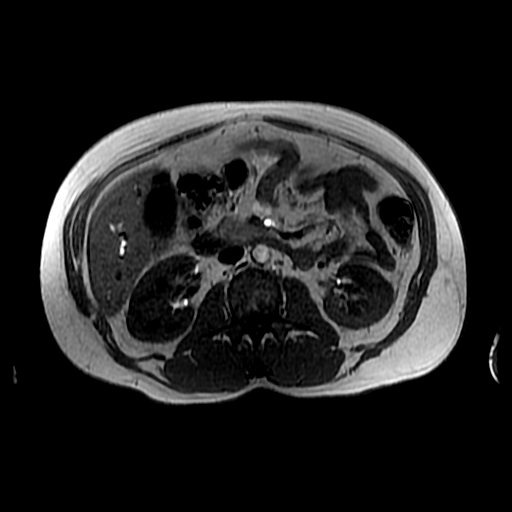
[im 76/114]
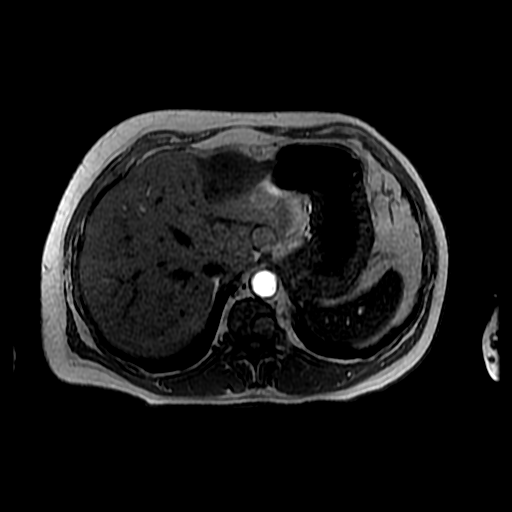
[im 114/114]
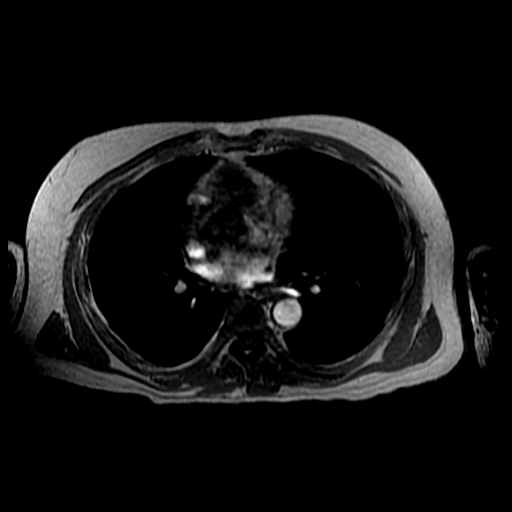

[Series 6: T2 · axial · 5.0mm · 0.78mm/px · z∈[-114,+161]mm · 2 of 56 slices shown (1 of 2)]
[im 1/56]
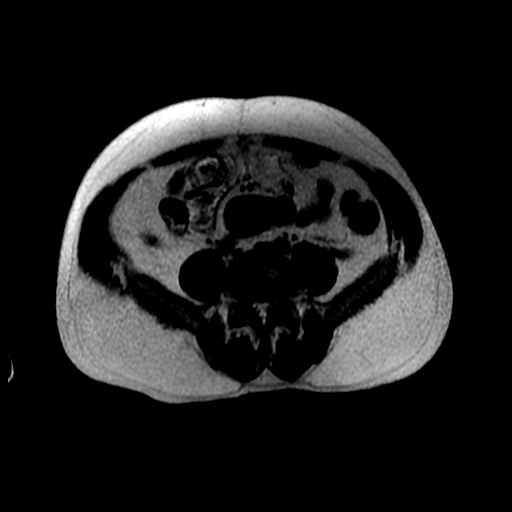
[im 56/56]
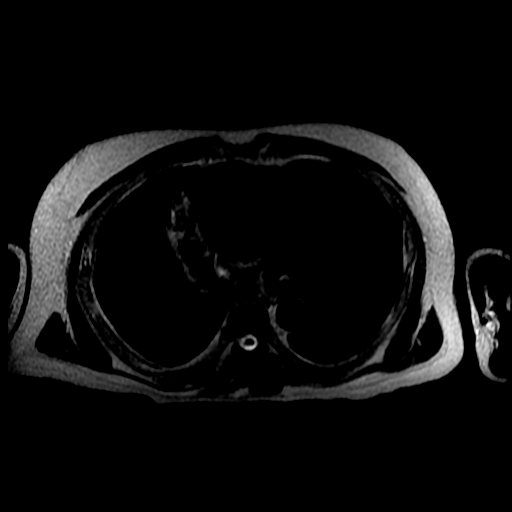

[Series 7: T2 · coronal · 5.0mm · 0.74mm/px · 2 of 48 slices shown (2 of 2)]
[im 1/48]
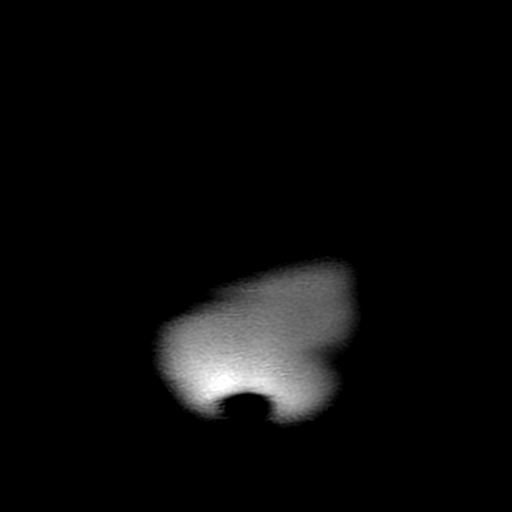
[im 48/48]
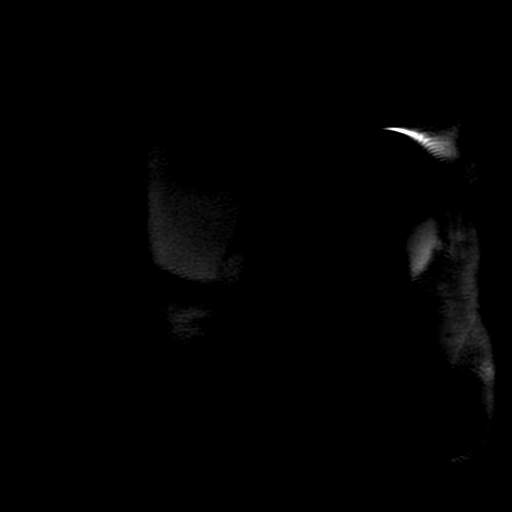

[Series 8: bSSFP · axial · 5.0mm · 0.78mm/px · z∈[-119,+161]mm · 2 of 57 slices shown]
[im 1/57]
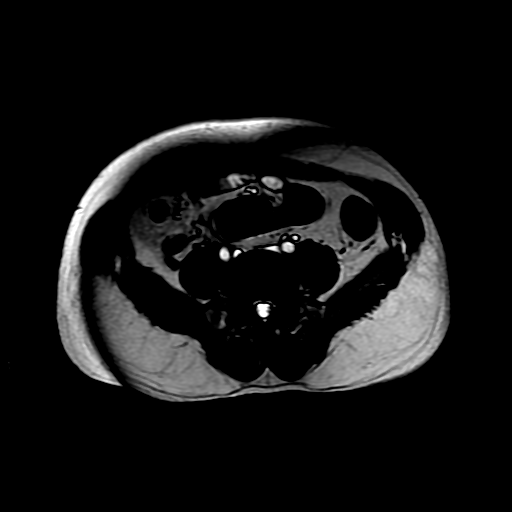
[im 57/57]
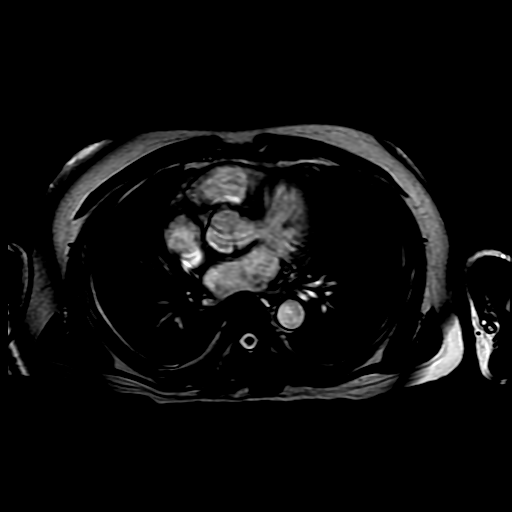

[Series 400: DWI · axial · 6.0mm · 1.48mm/px · 1 of 36 slices shown]
[im 1/36]
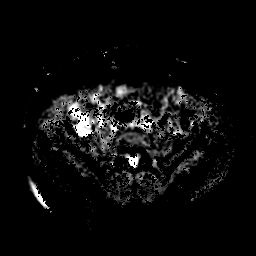

[Series 900: T1 dynamic · axial · 5.8mm · 0.78mm/px · z∈[-97,+155]mm · 3 of 88 slices shown (1 of 2)]
[im 1/88]
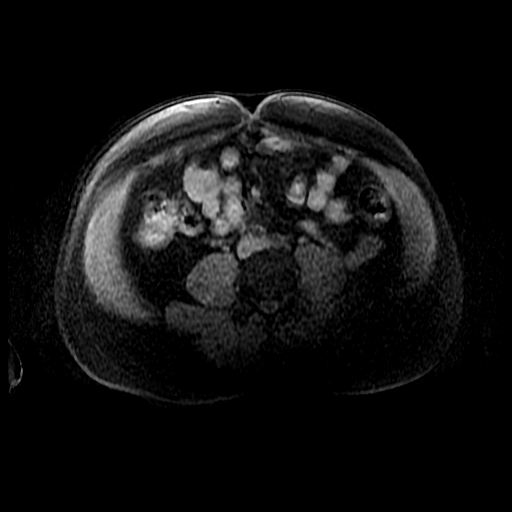
[im 44/88]
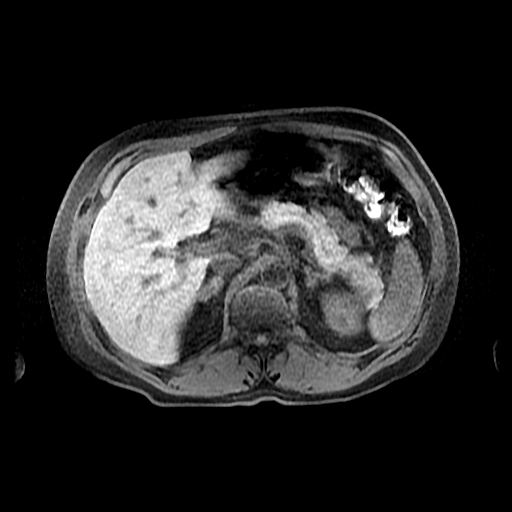
[im 88/88]
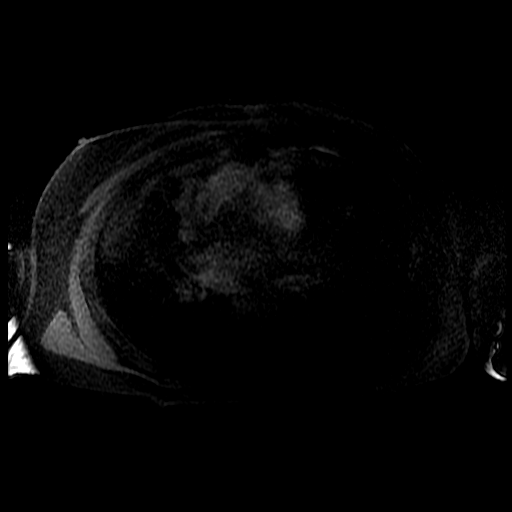

[Series 901: T1 dynamic · axial · 5.8mm · 0.78mm/px · 1 of 88 slices shown (2 of 2)]
[im 1/88]
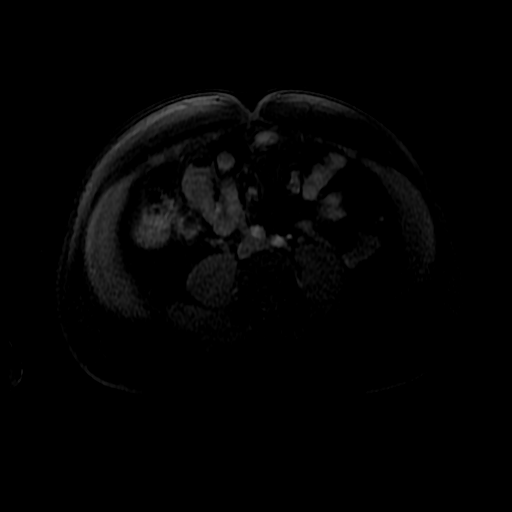

[19 of 48 positions shown; findings below may reference images not displayed]

FINDINGS: Lower chest: Clear lung bases.

Hepatobiliary: There is interval development of atrophy in the
anterior portion of the lateral segment left liver lobe, correlating
with thrombosis of the associated portal venous branch (series
30602/ image 35), correlating with the region of low attenuation on
the recent CT study. The portal veins are otherwise patent. There is
a new 3.3 x 2.8 cm heterogeneous T2 hyperintense T1 hypointense
nonenhancing mass within the infarcted anterolateral segment left
liver lobe (series 3/ image 20), which demonstrates prominent
restricted diffusion, compatible with a biloma/abscess. No hepatic
steatosis. Segment 5 right liver lobe 1.4 cm hemangioma. No
additional liver lesions. Normal gallbladder with no cholelithiasis.
No biliary ductal dilatation. Common bile duct diameter 5 mm. No
choledocholithiasis.

Pancreas: Tiny 0.3 cm unilocular cystic pancreatic lesion in the
pancreatic head (series 6/ image 37), which demonstrates no
enhancement, wall thickening or internal complexity. No additional
pancreatic lesions. No pancreatic duct dilation. No pancreas
divisum.

Spleen: Normal size. No mass.

Adrenals/Urinary Tract: Diffuse irregular thickening of both adrenal
glands without discrete adrenal nodules, suggesting adrenal
hyperplasia. No hydronephrosis. Normal kidneys with no renal mass.

Stomach/Bowel: Grossly normal stomach. Normal caliber visualized
small and large bowel. Persistent mild wall thickening in the cecum
at the site of acute diverticulitis seen on the recent CT study,
decreased. No additional sites of bowel wall thickening.

Vascular/Lymphatic: Normal caliber abdominal aorta. Patent hepatic,
main portal, splenic and renal veins. Subacute nonocclusive SMV
thrombosis (series 905/ image 64). No pathologically enlarged lymph
nodes in the abdomen.

Other: No abdominal ascites or focal fluid collection.

Musculoskeletal: No aggressive appearing focal osseous lesions.
IMPRESSION: 1. Evolving infarct in the anterior aspect of the lateral segment
left liver lobe. New 3.3 cm biloma/abscess within the infarcted
portion of the left liver lobe.
2. Subacute occlusive thrombosis of a branch of the left portal vein
(the cause of the above hepatic infarct). Subacute nonocclusive SMV
thrombosis. These thrombi are probably the sequela of the episode of
acute diverticulitis in the cecum (which is located in the right
upper quadrant of the abdomen). Mild residual wall thickening in the
cecum at the site of diverticulitis appears decreased.
3. Cystic 0.3 cm pancreatic head lesion, without high risk MRI
features. Follow-up MRI abdomen without and with IV contrast
recommended in 1 year. This recommendation follows ACR consensus
guidelines: Management of Incidental Pancreatic Cysts: A White Paper
of the ACR Incidental Findings Committee. [HOSPITAL]
9433;[DATE].
4. Adrenal hyperplasia.

These results will be called to the ordering clinician or
representative by the Radiologist Assistant, and communication
documented in the PACS or zVision Dashboard.

## 2019-05-18 ENCOUNTER — Other Ambulatory Visit: Payer: Self-pay | Admitting: Family Medicine

## 2019-05-18 MED FILL — PANTOPRAZOLE SOD DR 40 MG T: 40 | 30 days supply | Qty: 30 | Fill #2

## 2019-05-18 MED FILL — TRUEPLUS SYR 1ML 30GX5/16": 30G X 5/16" | 100 days supply | Qty: 100 | Fill #0

## 2019-05-18 MED FILL — ?METFORMIN HCL 500MG TABLET: 500 | 30 days supply | Qty: 120 | Fill #0

## 2019-05-18 MED FILL — TRUEPLUS SYR 1ML 30GX5/16: 30G X 5/16" | 100 days supply | Qty: 100 | Fill #0

## 2019-05-18 MED FILL — FOLIC ACID 1 MG TABS: 1 | 30 days supply | Qty: 30 | Fill #1

## 2019-05-18 MED FILL — AMLODIPINE BESYLATE 5 MG TA: 5 | 30 days supply | Qty: 30 | Fill #1

## 2019-05-18 MED FILL — LISINOPRIL 20 MG TABLET: 20 | 30 days supply | Qty: 45 | Fill #1

## 2019-05-20 MED FILL — GEMFIBROZIL 600 MG TAB: 600 | 30 days supply | Qty: 60 | Fill #5

## 2019-05-23 MED FILL — !LANTUS 100 UNITS/ML VIAL: 100 | 25 days supply | Qty: 10 | Fill #5

## 2019-05-24 ENCOUNTER — Ambulatory Visit: Payer: Self-pay | Admitting: Family Medicine

## 2019-06-08 ENCOUNTER — Encounter: Payer: Self-pay | Admitting: Family Medicine

## 2019-06-08 ENCOUNTER — Other Ambulatory Visit: Payer: Self-pay

## 2019-06-08 ENCOUNTER — Ambulatory Visit (INDEPENDENT_AMBULATORY_CARE_PROVIDER_SITE_OTHER): Payer: Medicare Other | Admitting: Family Medicine

## 2019-06-08 VITALS — BP 134/68 | HR 98 | Temp 98.3°F | Ht 64.0 in | Wt 181.2 lb

## 2019-06-08 DIAGNOSIS — E119 Type 2 diabetes mellitus without complications: Secondary | ICD-10-CM | POA: Diagnosis not present

## 2019-06-08 DIAGNOSIS — Z8673 Personal history of transient ischemic attack (TIA), and cerebral infarction without residual deficits: Secondary | ICD-10-CM | POA: Diagnosis not present

## 2019-06-08 DIAGNOSIS — R531 Weakness: Secondary | ICD-10-CM | POA: Diagnosis not present

## 2019-06-08 DIAGNOSIS — R7309 Other abnormal glucose: Secondary | ICD-10-CM | POA: Diagnosis not present

## 2019-06-08 DIAGNOSIS — Z09 Encounter for follow-up examination after completed treatment for conditions other than malignant neoplasm: Secondary | ICD-10-CM

## 2019-06-08 DIAGNOSIS — I10 Essential (primary) hypertension: Secondary | ICD-10-CM | POA: Diagnosis not present

## 2019-06-08 DIAGNOSIS — R8290 Unspecified abnormal findings in urine: Secondary | ICD-10-CM | POA: Diagnosis not present

## 2019-06-08 LAB — POCT URINALYSIS DIPSTICK
Bilirubin, UA: NEGATIVE
Blood, UA: NEGATIVE
Glucose, UA: POSITIVE — AB
Ketones, UA: NEGATIVE
Nitrite, UA: NEGATIVE
Protein, UA: POSITIVE — AB
Spec Grav, UA: >=1.03 — AB (ref 1.010–1.025)
Urobilinogen, UA: 0.2 E.U./dL
pH, UA: 6 (ref 5.0–8.0)

## 2019-06-08 LAB — POCT GLYCOSYLATED HEMOGLOBIN (HGB A1C): Hemoglobin A1C: 11.2 % — AB (ref 4.0–5.6)

## 2019-06-08 LAB — GLUCOSE, POCT (MANUAL RESULT ENTRY): POC Glucose: 170 mg/dl — AB (ref 70–99)

## 2019-06-08 MED ORDER — LANTUS 100 UNIT/ML ~~LOC~~ SOLN: 50.0000 [IU] | Freq: Every day | SUBCUTANEOUS | 11 refills | Status: DC

## 2019-06-08 MED FILL — LANTUS 100 UNITS/ML VIAL: 100 | 20 days supply | Qty: 10 | Fill #0

## 2019-06-08 NOTE — Progress Notes (Signed)
Patient [facility]    Established Patient Office Visit  Subjective:  Patient ID: Caleb Hernandez, male    DOB: August 28, 1960  Age: 59 y.o. MRN: [id number]  CC:  Chief Complaint  Patient presents with  . Follow-up    DM    HPI Caleb Hernandez is a 59 year old male who presents for Follow Up today.   Past Medical History:  Diagnosis Date  . Abdominal pain 09/17/2016  . Alcohol use disorder 09/26/2016  . Allergic rhinitis 08/28/2012  . Bilateral bunions 12/18/2014  . Cerebellar stroke, acute ([place]) 10/05/2016  . 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 Corns and callosity 08/28/2012  . Diabetes mellitus without complication ([place])   . Diverticulitis 09/17/2016  . Early cataracts, bilateral 01/03/2014  . Encounter for long-term (current) use of other medications 08/28/2012  . Essential hypertension 09/17/2016  . First degree heart block 08/28/2012  . GI bleed 09/17/2016  . High cholesterol   . Hx of adenomatous polyps of colon 09/09/2018  . Hypercholesterolemia 04/13/2013  . Hypertension   . Increased band cell count 09/26/2016  . Male hypogonadism 11/16/2013  . Nondependent alcohol abuse 09/17/2016  . Onychomycosis of toenail 12/18/2014  . Positive RPR test 09/27/2016  . Premature beats 08/28/2012  . Proteinuria 04/13/2013  . 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/2013  . Tinea pedis 08/28/2012  . Tinea pedis of both feet 12/18/2014  . Tobacco use 09/17/2016  . Type 2 diabetes mellitus, without long-term current use of insulin ([place]) 09/17/2016  . Undiagnosed cardiac murmurs 04/20/2012  . Vitamin D deficiency 04/13/2013    Current Status: Since he last office visit, he is doing well with no complaints.  He has not been monitoring his blood glucose levels regularly. He has seen low range of 200 and high of 280 since his last office visit. He denies fatigue, frequent urination, blurred vision, excessive hunger, excessive thirst, weight  gain, weight loss, and poor wound healing. He continues to check his feet regularly. He denies visual changes, chest pain, cough, shortness of breath, heart palpitations, and falls. He has occasional headaches and dizziness with position changes. Denies severe headaches, confusion, seizures, double vision, and blurred vision, nausea and vomiting. His is a social drinker. He currently drinks 2 alcoholic beverages a week. He denies fevers, chills, recent infections, weight loss, and night sweats. No reports of GI problems such as diarrhea, and constipation. He has no reports of blood in stools, dysuria and hematuria. No depression or anxiety reported today. He denies suicidal ideations, homicidal ideations, or auditory hallucinations. He denies pain today.   Past Surgical History:  Procedure Laterality Date  . BACK SURGERY    . COLONOSCOPY  2013  . LOOP RECORDER INSERTION N/A 10/17/2016   Procedure: Loop Recorder Insertion;  Surgeon: Evans Lance, MD;  Location: [facility];  Service: Cardiovascular;  Laterality: N/A;  . PACEMAKER IMPLANT     June 2018 or June 2019    Family History  Problem Relation Age of Onset  . Heart disease Brother   . Diabetes Mother   . Hypertension Mother   . Diabetes Father   . Hypercalcemia Father   . Stomach cancer Brother   . AAA (abdominal aortic aneurysm) Brother   . Colon cancer Neg Hx   . Esophageal cancer Neg Hx   . Rectal cancer Neg Hx     Social History   Socioeconomic History  . Marital status:  Single    Spouse name: Not on file  . Number of children: Not on file  . Years of education: Not on file  . Highest education level: Not on file  Occupational History  . Not on file  Tobacco Use  . Smoking status: Current Every Day Smoker    Types: Cigarettes  . Smokeless tobacco: Never Used  . Tobacco comment: Smoke about 5 cigarrettes per day  Substance and Sexual Activity  . Alcohol use: Yes    Comment: 40 oz per day/ former  . Drug  use: No  . Sexual activity: Yes    Partners: Female  Other Topics Concern  . Not on file  Social History Narrative   Single, unemployed   Uninsured   Hx EtOH   Smoker   No drugs   Social Determinants of Radio broadcast assistant Strain:   . Difficulty of Paying Living Expenses:   Food Insecurity:   . Worried About Charity fundraiser in the Last Year:   . Arboriculturist in the Last Year:   Transportation Needs:   . Film/video editor (Medical):   Marland Kitchen Lack of Transportation (Non-Medical):   Physical Activity:   . Days of Exercise per Week:   . Minutes of Exercise per Session:   Stress:   . Feeling of Stress :   Social Connections:   . Frequency of Communication with Friends and Family:   . Frequency of Social Gatherings with Friends and Family:   . Attends Religious Services:   . Active Member of Clubs or Organizations:   . Attends Archivist Meetings:   Marland Kitchen Marital Status:   Intimate Partner Violence:   . Fear of Current or Ex-Partner:   . Emotionally Abused:   Marland Kitchen Physically Abused:   . Sexually Abused:     Outpatient Medications Prior to Visit  Medication Sig Dispense Refill  . Magnesium (V-R MAGNESIUM) 250 MG TABS Take by mouth.    . thiamine 100 MG tablet Take by mouth.    Marland Kitchen amLODipine (NORVASC) 5 MG tablet TAKE 1 TABLET (5 MG TOTAL) BY MOUTH DAILY. 30 tablet 2  . blood glucose meter kit and supplies KIT Dispense based on patient and insurance preference. Use up to four times daily as directed. (FOR ICD-10. E.11.8). 1 each 0  . ELIQUIS 5 MG TABS tablet TAKE 1 TABLET BY MOUTH TWICE DAILY 818 tablet 10  . folic acid (FOLVITE) 1 MG tablet Take 1 tablet (1 mg total) by mouth daily. 90 tablet 1  . gemfibrozil (LOPID) 600 MG tablet Take 1 tablet (600 mg total) by mouth 2 (two) times daily before a meal. 180 tablet 1  . glucose blood (TRUE METRIX BLOOD GLUCOSE TEST) test strip USE AS DIRECTED UP TO 4 TIMES DAILY 200 each 2  . Insulin Glargine (LANTUS  SOLOSTAR) 100 UNIT/ML Solostar Pen Inject 50 Units into the skin daily. 5 pen PRN  . Insulin Pen Needle (NOVOFINE) 30G X 8 MM MISC Inject 10 each into the skin as needed. 200 each 2  . Insulin Syringe-Needle U-100 28G X 1/2" 1 ML MISC Use to administer insulin once daily. e11.8 300 each 0  . LANTUS 100 UNIT/ML injection     . lisinopril (ZESTRIL) 20 MG tablet TAKE 1.5 TABLETS (30 MG TOTAL) BY MOUTH DAILY. 45 tablet 2  . Magnesium 250 MG TABS Take 1 tablet by mouth daily.    . metFORMIN (GLUCOPHAGE) 500 MG tablet TAKE  2 TABLETS (1,000 MG TOTAL) BY MOUTH 2 (TWO) TIMES DAILY WITH A MEAL. 120 tablet 1  . Omega-3 Fatty Acids (FISH OIL) 600 MG CAPS Take 2 capsules by mouth 2 (two) times daily.    . pantoprazole (PROTONIX) 40 MG tablet TAKE 1 TABLET BY MOUTH DAILY. 30 tablet 2  . sitaGLIPtin (JANUVIA) 25 MG tablet Take 1 tablet (25 mg total) by mouth daily. 90 tablet 3  . Thiamine HCl (VITAMIN B-1) 250 MG tablet Take 250 mg by mouth daily.    Karen Chafe INSULIN SYRINGE 30G X 5/16" 1 ML MISC 30 mg.    . TRUEPLUS INSULIN SYRINGE 30G X 5/16" 1 ML MISC USE AS DIRECTED TO ADMINISTER INSULIN DAILY. 100 each 0  . TRUEPLUS LANCETS 30G MISC USE AS DIRECTED UP TO FOUR TIMES DAILY 200 each 0  . Vitamin D, Ergocalciferol, (DRISDOL) 1.25 MG (50000 UT) CAPS capsule TAKE 1 CAPSULE BY MOUTH EVERY 7 DAYS. 5 capsule 3   No facility-administered medications prior to visit.    No Known Allergies  ROS Review of Systems  Constitutional: Negative.   HENT: Negative.   Eyes: Negative.   Respiratory: Negative.   Cardiovascular: Negative.   Gastrointestinal: Negative.   Endocrine: Negative.   Genitourinary: Negative.   Musculoskeletal: Positive for arthralgias (generalized ).  Skin: Negative.   Allergic/Immunologic: Negative.   Neurological: Positive for dizziness (occasional ) and headaches (occasional ).  Hematological: Negative.   Psychiatric/Behavioral: Negative.       Objective:    Physical Exam    Constitutional: He is oriented to person, place, and time. He appears well-developed and well-nourished.  He arrives with assistance of cane.   HENT:  Head: Normocephalic and atraumatic.  Eyes: Conjunctivae are normal.  Cardiovascular: Normal rate, regular rhythm, normal heart sounds and intact distal pulses.  Pulmonary/Chest: Effort normal and breath sounds normal.  Abdominal: Soft. Bowel sounds are normal.  Musculoskeletal:        General: Normal range of motion.     Cervical back: Normal range of motion and neck supple.  Neurological: He is alert and oriented to person, place, and time. He has normal reflexes.  Skin: Skin is warm.  Psychiatric: He has a normal mood and affect. His behavior is normal. Judgment and thought content normal.  Nursing note and vitals reviewed.   Ht 5' 4" (1.626 m)   Wt 181 lb 3.2 oz (82.2 kg)   BMI 31.10 kg/m  Wt Readings from Last 3 Encounters:  06/08/19 181 lb 3.2 oz (82.2 kg)  12/21/18 178 lb 12.8 oz (81.1 kg)  09/27/18 180 lb (81.6 kg)     Health Maintenance Due  Topic Date Due  . PNEUMOCOCCAL POLYSACCHARIDE VACCINE AGE 80-64 HIGH RISK  Never done  . OPHTHALMOLOGY EXAM  Never done  . FOOT EXAM  12/15/2018    There are no preventive care reminders to display for this patient.  Lab Results  Component Value Date   TSH 0.514 06/21/2018   Lab Results  Component Value Date   WBC 9.5 06/21/2018   HGB 14.6 06/21/2018   HCT 41.6 06/21/2018   MCV 83 06/21/2018   PLT 329 06/21/2018   Lab Results  Component Value Date   NA 139 06/21/2018   K 4.5 06/21/2018   CO2 18 (L) 06/21/2018   GLUCOSE 202 (H) 06/21/2018   BUN 12 06/21/2018   CREATININE 0.87 06/21/2018   BILITOT 0.3 06/21/2018   ALKPHOS 90 06/21/2018   AST 16 06/21/2018  ALT 17 06/21/2018   PROT 7.1 06/21/2018   ALBUMIN 4.6 06/21/2018   CALCIUM 10.3 (H) 06/21/2018   ANIONGAP 9 09/20/2016   Lab Results  Component Value Date   CHOL 161 07/01/2017   Lab Results   Component Value Date   HDL 29 (L) 07/01/2017   Lab Results  Component Value Date   LDLCALC 98 07/01/2017   Lab Results  Component Value Date   TRIG 169 (H) 07/01/2017   Lab Results  Component Value Date   CHOLHDL 5.8 (H) 06/01/2017   Lab Results  Component Value Date   HGBA1C 11.2 (A) 06/08/2019    Assessment & Plan:   1. Type 2 diabetes mellitus without complication, without long-term current use of insulin ([place]) He will continue medication as prescribed, to decrease foods/beverages high in sugars and carbs and follow Heart Healthy or DASH diet. Increase physical activity to at least 30 minutes cardio exercise daily.  - POCT glycosylated hemoglobin (Hb A1C) - POCT urinalysis dipstick - POCT glucose (manual entry)  2. Hemoglobin A1C greater than 9%, indicating poor diabetic control Worsened. Hgb A1c increased at 11.2, from 9.8 on 12/21/2018. Continue to monitor.   3. Essential hypertension The current medical regimen is effective; blood pressure is stable at 134/68 today; continue present plan and medications as prescribed. He will continue to take medications as prescribed, to decrease high sodium intake, excessive alcohol intake, increase potassium intake, smoking cessation, and increase physical activity of at least 30 minutes of cardio activity daily. He will continue to follow Heart Healthy or DASH diet.  4. S/P stroke due to cerebrovascular disease Stable. No signs or symptoms of recurrence noted or reported.   5. Left-sided weakness He will continue to ambulate carefully to prevent falls.   6. Follow up He will follow up in 3 months.   No orders of the defined types were placed in this encounter.   Orders Placed This Encounter  Procedures  . POCT glycosylated hemoglobin (Hb A1C)  . POCT urinalysis dipstick  . POCT glucose (manual entry)    Referral Orders  No referral(s) requested today    Kathe Becton,  MSN, FNP-BC [address] [place], [place] [address] [phone] fax  Problem List Items Addressed This Visit      Cardiovascular and Mediastinum   Essential hypertension     Endocrine   Hemoglobin A1C greater than 9%, indicating poor diabetic control   Relevant Medications   LANTUS 100 UNIT/ML injection     Other   S/P stroke due to cerebrovascular disease    Other Visit Diagnoses    Type 2 diabetes mellitus without complication, without long-term current use of insulin ([facility])    -  Primary   Relevant Medications   LANTUS 100 UNIT/ML injection   Other Relevant Orders   POCT glycosylated hemoglobin (Hb A1C) (Completed)   POCT urinalysis dipstick   POCT glucose (manual entry) (Completed)   Left-sided weakness       Follow up          No orders of the defined types were placed in this encounter.   Follow-up: No follow-ups on file.    Azzie Glatter, FNP

## 2019-06-09 LAB — COMPREHENSIVE METABOLIC PANEL
ALT: 11 IU/L (ref 0–44)
AST: 12 IU/L (ref 0–40)
Albumin/Globulin Ratio: 1.5 (ref 1.2–2.2)
Albumin: 4.4 g/dL (ref 3.8–4.9)
Alkaline Phosphatase: 94 IU/L (ref 39–117)
BUN/Creatinine Ratio: 10 (ref 9–20)
BUN: 8 mg/dL (ref 6–24)
Bilirubin Total: 0.4 mg/dL (ref 0.0–1.2)
CO2: 21 mmol/L (ref 20–29)
Calcium: 10.1 mg/dL (ref 8.7–10.2)
Chloride: 103 mmol/L (ref 96–106)
Creatinine, Ser: 0.83 mg/dL (ref 0.76–1.27)
GFR calc Af Amer: 113 mL/min/{1.73_m2} (ref >59)
GFR calc non Af Amer: 98 mL/min/{1.73_m2} (ref >59)
Globulin, Total: 3 g/dL (ref 1.5–4.5)
Glucose: 177 mg/dL — ABNORMAL HIGH (ref 65–99)
Potassium: 4.1 mmol/L (ref 3.5–5.2)
Sodium: 141 mmol/L (ref 134–144)
Total Protein: 7.4 g/dL (ref 6.0–8.5)

## 2019-06-09 LAB — VITAMIN B12: Vitamin B-12: 364 pg/mL (ref 232–1245)

## 2019-06-09 LAB — CBC
Hematocrit: 44.6 % (ref 37.5–51.0)
Hemoglobin: 15.4 g/dL (ref 13.0–17.7)
MCH: 29.1 pg (ref 26.6–33.0)
MCHC: 34.5 g/dL (ref 31.5–35.7)
MCV: 84 fL (ref 79–97)
Platelets: 355 10*3/uL (ref 150–450)
RBC: 5.29 x10E6/uL (ref 4.14–5.80)
RDW: 13.2 % (ref 11.6–15.4)
WBC: 8.4 10*3/uL (ref 3.4–10.8)

## 2019-06-09 LAB — PSA: Prostate Specific Ag, Serum: 1.2 ng/mL (ref 0.0–4.0)

## 2019-06-09 LAB — LIPID PANEL
Chol/HDL Ratio: 7.8 ratio — ABNORMAL HIGH (ref 0.0–5.0)
Cholesterol, Total: 204 mg/dL — ABNORMAL HIGH (ref 100–199)
HDL: 26 mg/dL — ABNORMAL LOW (ref >39)
LDL Chol Calc (NIH): 109 mg/dL — ABNORMAL HIGH (ref 0–99)
Triglycerides: 402 mg/dL — ABNORMAL HIGH (ref 0–149)
VLDL Cholesterol Cal: 69 mg/dL — ABNORMAL HIGH (ref 5–40)

## 2019-06-09 LAB — VITAMIN D 25 HYDROXY (VIT D DEFICIENCY, FRACTURES): Vit D, 25-Hydroxy: 19.6 ng/mL — ABNORMAL LOW (ref 30.0–100.0)

## 2019-06-10 LAB — URINE CULTURE

## 2019-06-12 DIAGNOSIS — R531 Weakness: Secondary | ICD-10-CM | POA: Insufficient documentation

## 2019-06-13 DIAGNOSIS — I63412 Cerebral infarction due to embolism of left middle cerebral artery: Secondary | ICD-10-CM | POA: Diagnosis not present

## 2019-06-13 DIAGNOSIS — I471 Supraventricular tachycardia: Secondary | ICD-10-CM | POA: Diagnosis not present

## 2019-06-13 DIAGNOSIS — I10 Essential (primary) hypertension: Secondary | ICD-10-CM | POA: Diagnosis not present

## 2019-06-13 DIAGNOSIS — E11628 Type 2 diabetes mellitus with other skin complications: Secondary | ICD-10-CM | POA: Diagnosis not present

## 2019-06-13 DIAGNOSIS — I441 Atrioventricular block, second degree: Secondary | ICD-10-CM | POA: Diagnosis not present

## 2019-06-13 MED FILL — FLECAINIDE ACETATE 50 MG TA: 50 | 30 days supply | Qty: 60 | Fill #0

## 2019-06-15 ENCOUNTER — Other Ambulatory Visit: Payer: Self-pay | Admitting: Family Medicine

## 2019-06-15 ENCOUNTER — Encounter: Payer: Self-pay | Admitting: Family Medicine

## 2019-06-15 DIAGNOSIS — E559 Vitamin D deficiency, unspecified: Secondary | ICD-10-CM

## 2019-06-15 MED ORDER — VITAMIN D (ERGOCALCIFEROL) 1.25 MG (50000 UNIT) PO CAPS: ORAL_CAPSULE | ORAL | 3 refills | Status: DC

## 2019-06-15 MED FILL — VIT D2 1.25 MG (50,000 UNIT: 1.25 MG | 28 days supply | Qty: 4 | Fill #0

## 2019-06-20 ENCOUNTER — Other Ambulatory Visit: Payer: Self-pay | Admitting: Family Medicine

## 2019-06-20 DIAGNOSIS — K219 Gastro-esophageal reflux disease without esophagitis: Secondary | ICD-10-CM

## 2019-06-20 MED FILL — FOLIC ACID 1 MG TABS: 1 | 30 days supply | Qty: 30 | Fill #2

## 2019-06-20 MED FILL — LISINOPRIL 20 MG TABLET: 20 | 30 days supply | Qty: 45 | Fill #2

## 2019-06-20 MED FILL — ?PANTOPRAZOLE SO DR 40MG TA: 40 | 30 days supply | Qty: 30 | Fill #0

## 2019-06-20 MED FILL — ?METFORMIN HCL 500MG TABLET: 500 | 30 days supply | Qty: 120 | Fill #1

## 2019-06-20 MED FILL — GEMFIBROZIL 600 MG TAB: 600 | 30 days supply | Qty: 60 | Fill #0

## 2019-06-20 MED FILL — AMLODIPINE BESYLATE 5 MG TA: 5 | 30 days supply | Qty: 30 | Fill #2

## 2019-07-04 MED FILL — LANTUS 100 UNITS/ML VIAL: 100 | 20 days supply | Qty: 10 | Fill #1

## 2019-07-05 DIAGNOSIS — Z45018 Encounter for adjustment and management of other part of cardiac pacemaker: Secondary | ICD-10-CM | POA: Diagnosis not present

## 2019-07-05 DIAGNOSIS — I440 Atrioventricular block, first degree: Secondary | ICD-10-CM | POA: Diagnosis not present

## 2019-07-11 MED FILL — VIT D2 1.25 MG (50,000 UNIT: 1.25 MG | 28 days supply | Qty: 4 | Fill #1

## 2019-07-15 ENCOUNTER — Other Ambulatory Visit: Payer: Self-pay | Admitting: Family Medicine

## 2019-07-15 DIAGNOSIS — I517 Cardiomegaly: Secondary | ICD-10-CM | POA: Diagnosis not present

## 2019-07-15 MED FILL — GEMFIBROZIL 600 MG TAB: 600 | 30 days supply | Qty: 60 | Fill #1

## 2019-07-15 MED FILL — LISINOPRIL 20 MG TABLET: 20 | 30 days supply | Qty: 45 | Fill #0

## 2019-07-15 MED FILL — ?PANTOPRAZOLE SO DR 40MG TA: 40 | 30 days supply | Qty: 30 | Fill #1

## 2019-07-15 MED FILL — FOLIC ACID 1 MG TABS: 1 | 30 days supply | Qty: 30 | Fill #3

## 2019-07-15 MED FILL — METFORMIN HCL 500 MG TABS: 500 | 30 days supply | Qty: 120 | Fill #0

## 2019-07-15 MED FILL — AMLODIPINE BESYLATE 5 MG TA: 5 | 30 days supply | Qty: 30 | Fill #0

## 2019-08-08 ENCOUNTER — Other Ambulatory Visit: Payer: Self-pay | Admitting: Family Medicine

## 2019-08-08 DIAGNOSIS — E119 Type 2 diabetes mellitus without complications: Secondary | ICD-10-CM

## 2019-08-08 MED FILL — VIT D2 1.25 MG (50,000 UNIT: 1.25 MG | 28 days supply | Qty: 4 | Fill #2

## 2019-08-08 MED FILL — JANUVIA 25 MG TABLET: 25 | 30 days supply | Qty: 30 | Fill #0

## 2019-08-18 MED FILL — METFORMIN HCL 500 MG TABS: 500 | 30 days supply | Qty: 120 | Fill #1

## 2019-08-18 MED FILL — FOLIC ACID 1 MG TABS: 1 | 30 days supply | Qty: 30 | Fill #4

## 2019-08-18 MED FILL — AMLODIPINE BESYLATE 5 MG TA: 5 | 30 days supply | Qty: 30 | Fill #1

## 2019-08-18 MED FILL — GEMFIBROZIL 600 MG TAB: 600 | 30 days supply | Qty: 60 | Fill #2

## 2019-08-18 MED FILL — PANTOPRAZOLE SOD DR 40 MG T: 40 | 30 days supply | Qty: 30 | Fill #2

## 2019-08-18 MED FILL — LISINOPRIL 20 MG TABLET: 20 | 30 days supply | Qty: 45 | Fill #1

## 2019-09-05 MED FILL — VIT D2 1.25 MG (50,000 UNIT: 1.25 MG | 28 days supply | Qty: 4 | Fill #3

## 2019-09-05 MED FILL — JANUVIA 25 MG TABLET: 25 | 90 days supply | Qty: 90 | Fill #1

## 2019-09-08 DIAGNOSIS — I700 Atherosclerosis of aorta: Secondary | ICD-10-CM | POA: Diagnosis not present

## 2019-09-08 DIAGNOSIS — K760 Fatty (change of) liver, not elsewhere classified: Secondary | ICD-10-CM | POA: Diagnosis not present

## 2019-09-08 DIAGNOSIS — R109 Unspecified abdominal pain: Secondary | ICD-10-CM | POA: Diagnosis not present

## 2019-09-08 DIAGNOSIS — Z950 Presence of cardiac pacemaker: Secondary | ICD-10-CM | POA: Diagnosis not present

## 2019-09-08 DIAGNOSIS — K5732 Diverticulitis of large intestine without perforation or abscess without bleeding: Secondary | ICD-10-CM | POA: Diagnosis not present

## 2019-09-08 DIAGNOSIS — K5792 Diverticulitis of intestine, part unspecified, without perforation or abscess without bleeding: Secondary | ICD-10-CM | POA: Diagnosis not present

## 2019-09-08 DIAGNOSIS — K5730 Diverticulosis of large intestine without perforation or abscess without bleeding: Secondary | ICD-10-CM | POA: Diagnosis not present

## 2019-09-12 ENCOUNTER — Ambulatory Visit: Payer: Medicare Other | Admitting: Family Medicine

## 2019-09-12 ENCOUNTER — Other Ambulatory Visit: Payer: Self-pay | Admitting: Family Medicine

## 2019-09-12 MED FILL — LISINOPRIL 20 MG TABLET: 20 | 30 days supply | Qty: 45 | Fill #2

## 2019-09-12 MED FILL — ELIQUIS 5 MG TABLET: 5 | 30 days supply | Qty: 60 | Fill #0

## 2019-09-23 ENCOUNTER — Encounter: Payer: Self-pay | Admitting: Family Medicine

## 2019-09-23 ENCOUNTER — Other Ambulatory Visit: Payer: Self-pay

## 2019-09-23 ENCOUNTER — Ambulatory Visit (INDEPENDENT_AMBULATORY_CARE_PROVIDER_SITE_OTHER): Payer: Medicare HMO | Admitting: Family Medicine

## 2019-09-23 VITALS — BP 143/82 | HR 96 | Temp 97.5°F | Ht 64.0 in | Wt 178.4 lb

## 2019-09-23 DIAGNOSIS — Z8673 Personal history of transient ischemic attack (TIA), and cerebral infarction without residual deficits: Secondary | ICD-10-CM

## 2019-09-23 DIAGNOSIS — R531 Weakness: Secondary | ICD-10-CM

## 2019-09-23 DIAGNOSIS — R7309 Other abnormal glucose: Secondary | ICD-10-CM | POA: Diagnosis not present

## 2019-09-23 DIAGNOSIS — E119 Type 2 diabetes mellitus without complications: Secondary | ICD-10-CM

## 2019-09-23 DIAGNOSIS — Z09 Encounter for follow-up examination after completed treatment for conditions other than malignant neoplasm: Secondary | ICD-10-CM | POA: Diagnosis not present

## 2019-09-23 DIAGNOSIS — K5792 Diverticulitis of intestine, part unspecified, without perforation or abscess without bleeding: Secondary | ICD-10-CM

## 2019-09-23 DIAGNOSIS — I10 Essential (primary) hypertension: Secondary | ICD-10-CM

## 2019-09-23 LAB — POCT URINALYSIS DIPSTICK
Bilirubin, UA: NEGATIVE
Blood, UA: NEGATIVE
Glucose, UA: NEGATIVE
Ketones, UA: NEGATIVE
Leukocytes, UA: NEGATIVE
Nitrite, UA: NEGATIVE
Protein, UA: POSITIVE — AB
Spec Grav, UA: 1.025 (ref 1.010–1.025)
Urobilinogen, UA: 0.2 E.U./dL
pH, UA: 5.5 (ref 5.0–8.0)

## 2019-09-23 LAB — POCT GLYCOSYLATED HEMOGLOBIN (HGB A1C)
HbA1c POC (<> result, manual entry): 9.4 % (ref 4.0–5.6)
HbA1c, POC (controlled diabetic range): 9.4 % — AB (ref 0.0–7.0)
HbA1c, POC (prediabetic range): 9.4 % — AB (ref 5.7–6.4)
Hemoglobin A1C: 9.4 % — AB (ref 4.0–5.6)

## 2019-09-23 LAB — GLUCOSE, POCT (MANUAL RESULT ENTRY): POC Glucose: 132 mg/dl — AB (ref 70–99)

## 2019-09-23 NOTE — Progress Notes (Signed)
Patient [name] Follow Up  Subjective:  Patient ID: Caleb Hernandez, male    DOB: Aug 11, 1960  Age: 59 y.o. MRN: [id number]  CC:  Chief Complaint  Patient presents with  . 3 month FU    no complaints    HPI  Caleb Hernandez is a 59 year old male who presents for Hospital Follow Up today.    Patient Active Problem List   Diagnosis Date Noted  . Left-sided weakness 06/12/2019  . Hemoglobin A1C greater than 9%, indicating poor diabetic control 09/21/2018  . S/P stroke due to cerebrovascular disease 09/21/2018  . Type 2 diabetes mellitus with insulin therapy ([place]) 09/09/2018  . Status post placement of implantable loop recorder 11/17/2016  . Chronic anticoagulation after embolic stroke 29/92/4268  . Cerebellar stroke, acute ([place]) 10/05/2016  . Positive RPR test 09/27/2016  . Alcohol use disorder 09/26/2016  . 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 Abdominal pain 09/17/2016  . Essential hypertension 09/17/2016  . Nondependent alcohol abuse 09/17/2016  . Tobacco use 09/17/2016  . Bilateral bunions 12/18/2014  . Onychomycosis of toenail 12/18/2014  . Tinea pedis of both feet 12/18/2014  . Early cataracts, bilateral 01/03/2014  . Male hypogonadism 11/16/2013  . Hypercholesterolemia 04/13/2013  . Proteinuria 04/13/2013  . 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/2013  . Vitamin D deficiency 04/13/2013  . Allergic rhinitis 08/28/2012  . Corns and callosity 08/28/2012  . Encounter for long-term (current) use of other medications 08/28/2012  . First degree heart block 08/28/2012  . Premature beats 08/28/2012  . Undiagnosed cardiac murmurs 04/20/2012  . Hx of adenomatous polyps of colon 12/01/2011   Past Medical History:  Diagnosis Date  . Abdominal pain 09/17/2016  . Alcohol use disorder 09/26/2016  . Allergic rhinitis 08/28/2012  . Bilateral bunions 12/18/2014  . Cerebellar stroke, acute  ([place]) 10/05/2016  . 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 Corns and callosity 08/28/2012  . Diabetes mellitus without complication ([place])   . Diverticulitis 09/17/2016  . Diverticulitis 09/08/2019  . Early cataracts, bilateral 01/03/2014  . Encounter for long-term (current) use of other medications 08/28/2012  . Essential hypertension 09/17/2016  . First degree heart block 08/28/2012  . GI bleed 09/17/2016  . High cholesterol   . Hx of adenomatous polyps of colon 09/09/2018  . Hypercholesterolemia 04/13/2013  . Hypertension   . Increased band cell count 09/26/2016  . Male hypogonadism 11/16/2013  . Nondependent alcohol abuse 09/17/2016  . Onychomycosis of toenail 12/18/2014  . Positive RPR test 09/27/2016  . Premature beats 08/28/2012  . Proteinuria 04/13/2013  . 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/2013  . Tinea pedis 08/28/2012  . Tinea pedis of both feet 12/18/2014  . Tobacco use 09/17/2016  . Type 2 diabetes mellitus, without long-term current use of insulin ([place]) 09/17/2016  . Undiagnosed cardiac murmurs 04/20/2012  . Vitamin D deficiency 04/13/2013   Current Status: Since his last office visit, he has had an ED visit for Diverticulitis on 09/08/2019. Today, he is doing well with no left lower abdominal pain or diarrhea complaints. his denies visual changes, chest pain, cough, shortness of breath, heart palpitations, and falls. He has occasional headaches and dizziness with position changes. Denies severe headaches, confusion, seizures, double vision, and blurred vision, nausea and vomiting. He denies fevers, chills, fatigue, recent infections, weight loss, and night sweats. Denies GI problems such as diarrhea, and constipation. He has no reports  of blood in stools, dysuria and hematuria. No depression or anxiety today. He is takingg all medications as prescribed. He denies pain today.   Past Surgical History:  Procedure Laterality Date  . BACK SURGERY    . COLONOSCOPY   2013  . LOOP RECORDER INSERTION N/A 10/17/2016   Procedure: Loop Recorder Insertion;  Surgeon: Evans Lance, MD;  Location: [facility];  Service: Cardiovascular;  Laterality: N/A;  . PACEMAKER IMPLANT     June 2018 or June 2019    Family History  Problem Relation Age of Onset  . Heart disease Brother   . Diabetes Mother   . Hypertension Mother   . Diabetes Father   . Hypercalcemia Father   . Stomach cancer Brother   . AAA (abdominal aortic aneurysm) Brother   . Colon cancer Neg Hx   . Esophageal cancer Neg Hx   . Rectal cancer Neg Hx     Social History   Socioeconomic History  . Marital status: Single    Spouse name: Not on file  . Number of children: Not on file  . Years of education: Not on file  . Highest education level: Not on file  Occupational History  . Not on file  Tobacco Use  . Smoking status: Current Every Day Smoker    Types: Cigarettes  . Smokeless tobacco: Never Used  . Tobacco comment: Smoke about 5 cigarrettes per day  Vaping Use  . Vaping Use: Never used  Substance and Sexual Activity  . Alcohol use: Yes    Comment: 40 oz per day/ former  . Drug use: No  . Sexual activity: Yes    Partners: Female  Other Topics Concern  . Not on file  Social History Narrative   Single, unemployed   Uninsured   Hx EtOH   Smoker   No drugs   Social Determinants of Radio broadcast assistant Strain:   . Difficulty of Paying Living Expenses:   Food Insecurity:   . Worried About Charity fundraiser in the Last Year:   . Arboriculturist in the Last Year:   Transportation Needs:   . Film/video editor (Medical):   Marland Kitchen Lack of Transportation (Non-Medical):   Physical Activity:   . Days of Exercise per Week:   . Minutes of Exercise per Session:   Stress:   . Feeling of Stress :   Social Connections:   . Frequency of Communication with Friends and Family:   . Frequency of Social Gatherings with Friends and Family:   . Attends Religious  Services:   . Active Member of Clubs or Organizations:   . Attends Archivist Meetings:   Marland Kitchen Marital Status:   Intimate Partner Violence:   . Fear of Current or Ex-Partner:   . Emotionally Abused:   Marland Kitchen Physically Abused:   . Sexually Abused:     Outpatient Medications Prior to Visit  Medication Sig Dispense Refill  . amLODipine (NORVASC) 5 MG tablet TAKE 1 TABLET (5 MG TOTAL) BY MOUTH DAILY. 30 tablet 2  . blood glucose meter kit and supplies KIT Dispense based on patient and insurance preference. Use up to four times daily as directed. (FOR ICD-10. E.11.8). 1 each 0  . ELIQUIS 5 MG TABS tablet TAKE 1 TABLET (5 MG TOTAL) BY MOUTH 2 (TWO) TIMES DAILY. 60 tablet 0  . folic acid (FOLVITE) 1 MG tablet Take 1 tablet (1 mg total) by mouth daily. [place]  tablet 1  . gemfibrozil (LOPID) 600 MG tablet Take 1 tablet (600 mg total) by mouth 2 (two) times daily before a meal. 180 tablet 1  . glucose blood (TRUE METRIX BLOOD GLUCOSE TEST) test strip USE AS DIRECTED UP TO 4 TIMES DAILY 200 each 2  . Insulin Pen Needle (NOVOFINE) 30G X 8 MM MISC Inject 10 each into the skin as needed. 200 each 2  . Insulin Syringe-Needle U-100 28G X 1/2" 1 ML MISC Use to administer insulin once daily. e11.8 300 each 0  . JANUVIA 25 MG tablet TAKE 1 TABLET (25 MG TOTAL) BY MOUTH DAILY. 30 tablet 3  . LANTUS 100 UNIT/ML injection Inject 0.5 mLs (50 Units total) into the skin at bedtime. 10 mL 11  . lisinopril (ZESTRIL) 20 MG tablet TAKE 1.5 TABLETS (30 MG TOTAL) BY MOUTH DAILY. 45 tablet 2  . Magnesium 250 MG TABS Take 1 tablet by mouth daily.    . metFORMIN (GLUCOPHAGE) 500 MG tablet TAKE 2 TABLETS (1,000 MG TOTAL) BY MOUTH 2 (TWO) TIMES DAILY WITH A MEAL. 120 tablet 1  . Omega-3 Fatty Acids (FISH OIL) 600 MG CAPS Take 2 capsules by mouth 2 (two) times daily.    . pantoprazole (PROTONIX) 40 MG tablet TAKE 1 TABLET BY MOUTH DAILY. 30 tablet 2  . Thiamine HCl (VITAMIN B-1) 250 MG tablet Take 250 mg by mouth daily.      Karen Chafe INSULIN SYRINGE 30G X 5/16" 1 ML MISC 30 mg.    . TRUEPLUS INSULIN SYRINGE 30G X 5/16" 1 ML MISC USE AS DIRECTED TO ADMINISTER INSULIN DAILY. 100 each 0  . TRUEPLUS LANCETS 30G MISC USE AS DIRECTED UP TO FOUR TIMES DAILY 200 each 0  . Vitamin D, Ergocalciferol, (DRISDOL) 1.25 MG (50000 UNIT) CAPS capsule TAKE 1 CAPSULE BY MOUTH EVERY 7 DAYS. 5 capsule 3   No facility-administered medications prior to visit.    No Known Allergies  ROS Review of Systems  Constitutional:       Ambulates with cane  HENT: Negative.   Eyes: Negative.   Respiratory: Negative.   Cardiovascular: Negative.   Gastrointestinal: Positive for abdominal distention and diarrhea (occasional ).  Endocrine: Negative.   Genitourinary: Negative.   Musculoskeletal: Positive for arthralgias (generalized joint pain).  Skin: Negative.   Allergic/Immunologic: Negative.   Neurological: Positive for dizziness (occasional ) and headaches (occasional ).  Hematological: Negative.   Psychiatric/Behavioral: Negative.       Objective:    Physical Exam Vitals and nursing note reviewed.  Constitutional:      Appearance: Normal appearance. He is normal weight.  HENT:     Head: Normocephalic and atraumatic.     Nose: Nose normal.     Mouth/Throat:     Mouth: Mucous membranes are moist.     Pharynx: Oropharynx is clear.  Cardiovascular:     Rate and Rhythm: Normal rate and regular rhythm.     Pulses: Normal pulses.     Heart sounds: Normal heart sounds.  Pulmonary:     Effort: Pulmonary effort is normal.     Breath sounds: Normal breath sounds.  Abdominal:     General: Bowel sounds are normal.     Palpations: Abdomen is soft.  Musculoskeletal:        General: Normal range of motion.     Cervical back: Normal range of motion and neck supple.  Skin:    General: Skin is warm and dry.  Neurological:  General: No focal deficit present.     Mental Status: He is alert and oriented to person, place, and  time.  Psychiatric:        Mood and Affect: Mood normal.        Behavior: Behavior normal.        Thought Content: Thought content normal.        Judgment: Judgment normal.     BP (!) 143/82 (BP Location: Left Arm, Patient Position: Sitting, Cuff Size: Normal)   Pulse 96   Temp (!) 97.5 F (36.4 C)   Ht _0  (1.626 m)   Wt 178 lb 6.4 oz (80.9 kg)   SpO2 99%   BMI 30.62 kg/m  Wt Readings from Last 3 Encounters:  09/23/19 178 lb 6.4 oz (80.9 kg)  06/08/19 181 lb 3.2 oz (82.2 kg)  12/21/18 178 lb 12.8 oz (81.1 kg)     Health Maintenance Due  Topic Date Due  . PNEUMOCOCCAL POLYSACCHARIDE VACCINE AGE 47-64 HIGH RISK  Never done  . OPHTHALMOLOGY EXAM  Never done  . COVID-19 Vaccine (1) Never done  . FOOT EXAM  12/15/2018    There are no preventive care reminders to display for this patient.  Lab Results  Component Value Date   TSH 0.514 06/21/2018   Lab Results  Component Value Date   WBC 8.4 06/08/2019   HGB 15.4 06/08/2019   HCT 44.6 06/08/2019   MCV 84 06/08/2019   PLT 355 06/08/2019   Lab Results  Component Value Date   NA 141 06/08/2019   K 4.1 06/08/2019   CO2 21 06/08/2019   GLUCOSE 177 (H) 06/08/2019   BUN 8 06/08/2019   CREATININE 0.83 06/08/2019   BILITOT 0.4 06/08/2019   ALKPHOS 94 06/08/2019   AST 12 06/08/2019   ALT 11 06/08/2019   PROT 7.4 06/08/2019   ALBUMIN 4.4 06/08/2019   CALCIUM 10.1 06/08/2019   ANIONGAP 9 09/20/2016   Lab Results  Component Value Date   CHOL 204 (H) 06/08/2019   Lab Results  Component Value Date   HDL 26 (L) 06/08/2019   Lab Results  Component Value Date   LDLCALC 109 (H) 06/08/2019   Lab Results  Component Value Date   TRIG 402 (H) 06/08/2019   Lab Results  Component Value Date   CHOLHDL 7.8 (H) 06/08/2019   Lab Results  Component Value Date   HGBA1C 9.4 (A) 09/23/2019   HGBA1C 9.4 09/23/2019   HGBA1C 9.4 (A) 09/23/2019   HGBA1C 9.4 (A) 09/23/2019      Assessment & Plan:   1. Hospital  discharge follow-up  2. Diverticulitis Stable today. No signs or symptoms of recurrence noted or reported.   3. Type 2 diabetes mellitus without complication, without long-term current use of insulin ([place]) She will continue medication as prescribed, to decrease foods/beverages high in sugars and carbs and follow Heart Healthy or DASH diet. Increase physical activity to at least 30 minutes cardio exercise daily.  - POC HgB A1c - POC Glucose (CBG) - POCT Urinalysis Dipstick  4. Hemoglobin A1C greater than 9%, indicating poor diabetic control Decreased. Hgb A1c is at 9.4 today, from 11.2 on 06/08/2019. Monitor.   5. S/P stroke due to cerebrovascular disease No sign or symptoms of recurrence noted or reported.   6. Left-sided weakness  7. Essential hypertension The current medical regimen is effective; blood pressure is stable at 143/82 today; continue present plan and medications as prescribed. He will continue to take  medications as prescribed, to decrease high sodium intake, excessive alcohol intake, increase potassium intake, smoking cessation, and increase physical activity of at least 30 minutes of cardio activity daily. He will continue to follow Heart Healthy or DASH diet.  8. Follow up He will follow up 3 months.    No orders of the defined types were placed in this encounter.   Orders Placed This Encounter  Procedures  . POC HgB A1c  . POC Glucose (CBG)  . POCT Urinalysis Dipstick    Referral Orders  No referral(s) requested today    Kathe Becton,  MSN, FNP-BC [address] [place], [place] [address] [phone] fax   Problem List Items Addressed This Visit      Cardiovascular and Mediastinum   Essential hypertension     Endocrine   Hemoglobin A1C greater than 9%, indicating poor diabetic control     Nervous and Auditory   Left-sided  weakness     Other   S/P stroke due to cerebrovascular disease    Other Visit Diagnoses    Hospital discharge follow-up    -  Primary   Diverticulitis       Type 2 diabetes mellitus without complication, without long-term current use of insulin ([facility])       Relevant Orders   POC HgB A1c (Completed)   POC Glucose (CBG) (Completed)   POCT Urinalysis Dipstick   Follow up          No orders of the defined types were placed in this encounter.   Follow-up: No follow-ups on file.    Azzie Glatter, FNP

## 2019-09-23 NOTE — Patient Instructions (Signed)

## 2019-09-27 ENCOUNTER — Other Ambulatory Visit: Payer: Self-pay | Admitting: Family Medicine

## 2019-09-27 DIAGNOSIS — K219 Gastro-esophageal reflux disease without esophagitis: Secondary | ICD-10-CM

## 2019-09-27 MED FILL — GEMFIBROZIL 600 MG TAB: 600 | 30 days supply | Qty: 60 | Fill #3

## 2019-09-27 MED FILL — VIT D2 1.25 MG (50,000 UNIT: 1.25 MG | 28 days supply | Qty: 4 | Fill #4

## 2019-09-27 MED FILL — METFORMIN HCL 500 MG TABS: 500 | 30 days supply | Qty: 120 | Fill #0

## 2019-09-27 MED FILL — AMLODIPINE BESYLATE 5 MG TA: 5 | 30 days supply | Qty: 30 | Fill #2

## 2019-09-27 MED FILL — PANTOPRAZOLE SOD DR 40 MG T: 40 | 30 days supply | Qty: 30 | Fill #0

## 2019-09-27 MED FILL — FOLIC ACID 1 MG TABS: 1 | 30 days supply | Qty: 30 | Fill #5

## 2019-09-28 ENCOUNTER — Telehealth: Payer: Self-pay | Admitting: Family Medicine

## 2019-09-29 ENCOUNTER — Other Ambulatory Visit: Payer: Self-pay | Admitting: Family Medicine

## 2019-09-29 MED FILL — TRUEPLUS SYR 1ML 30GX5/16: 30G X 5/16" | 100 days supply | Qty: 100 | Fill #0

## 2019-09-30 NOTE — Telephone Encounter (Signed)
Done

## 2019-10-03 ENCOUNTER — Other Ambulatory Visit: Payer: Self-pay | Admitting: Family Medicine

## 2019-10-04 DIAGNOSIS — Z950 Presence of cardiac pacemaker: Secondary | ICD-10-CM | POA: Diagnosis not present

## 2019-10-11 ENCOUNTER — Other Ambulatory Visit: Payer: Self-pay | Admitting: Family Medicine

## 2019-10-12 MED FILL — TRUEPLUS SYR 1ML 30GX5/16: 30G X 5/16" | 100 days supply | Qty: 100 | Fill #0 | Status: TO

## 2019-10-12 MED FILL — LISINOPRIL 20 MG TABLET: 20 | 30 days supply | Qty: 45 | Fill #0 | Status: TO

## 2019-10-12 MED FILL — ELIQUIS 5 MG TABLET: 5 | 30 days supply | Qty: 60 | Fill #0 | Status: TO

## 2019-10-24 MED FILL — PANTOPRAZOLE SOD DR 40 MG T: 40 | 30 days supply | Qty: 30 | Fill #1

## 2019-10-24 MED FILL — METFORMIN HCL 500 MG TABS: 500 | 30 days supply | Qty: 120 | Fill #1

## 2019-10-25 MED FILL — TRUEPLUS SYR 1ML 30GX5/16: 30G X 5/16" | 90 days supply | Qty: 100 | Fill #0

## 2019-10-26 ENCOUNTER — Other Ambulatory Visit: Payer: Self-pay | Admitting: Family Medicine

## 2019-10-26 DIAGNOSIS — K5732 Diverticulitis of large intestine without perforation or abscess without bleeding: Secondary | ICD-10-CM | POA: Diagnosis not present

## 2019-10-26 DIAGNOSIS — E559 Vitamin D deficiency, unspecified: Secondary | ICD-10-CM

## 2019-10-26 DIAGNOSIS — K760 Fatty (change of) liver, not elsewhere classified: Secondary | ICD-10-CM | POA: Diagnosis not present

## 2019-10-26 DIAGNOSIS — K5792 Diverticulitis of intestine, part unspecified, without perforation or abscess without bleeding: Secondary | ICD-10-CM | POA: Diagnosis not present

## 2019-10-26 DIAGNOSIS — K6389 Other specified diseases of intestine: Secondary | ICD-10-CM | POA: Diagnosis not present

## 2019-10-26 MED FILL — VIT D2 1.25 MG (50,000 UNIT: 1.25 MG | 28 days supply | Qty: 4 | Fill #0 | Status: TO

## 2019-10-26 MED FILL — GEMFIBROZIL 600 MG TAB: 600 | 30 days supply | Qty: 60 | Fill #4 | Status: TO

## 2019-10-26 MED FILL — AMLODIPINE BESYLATE 5 MG TA: 5 | 30 days supply | Qty: 30 | Fill #0 | Status: TO

## 2019-10-26 MED FILL — FOLIC ACID 1 MG TABS: 1 | 30 days supply | Qty: 30 | Fill #0 | Status: TO

## 2019-11-03 MED FILL — LANTUS 100 UNITS/ML VIAL: 100 | 20 days supply | Qty: 10 | Fill #2

## 2019-11-03 MED FILL — FLECAINIDE ACETATE 50 MG TA: 50 | 30 days supply | Qty: 60 | Fill #1

## 2019-11-03 MED FILL — GEMFIBROZIL 600 MG TAB: 600 | 30 days supply | Qty: 60 | Fill #4 | Status: TO

## 2019-11-03 MED FILL — AMLODIPINE BESYLATE 5 MG TA: 5 | 30 days supply | Qty: 30 | Fill #0 | Status: TO

## 2019-11-03 MED FILL — FOLIC ACID 1 MG TABS: 1 | 30 days supply | Qty: 30 | Fill #0 | Status: TO

## 2019-11-06 ENCOUNTER — Other Ambulatory Visit: Payer: Self-pay | Admitting: Family Medicine

## 2019-11-06 DIAGNOSIS — E119 Type 2 diabetes mellitus without complications: Secondary | ICD-10-CM

## 2019-11-06 MED ORDER — INSULIN DETEMIR 100 UNIT/ML ~~LOC~~ SOLN: 50.0000 [IU] | Freq: Every day | SUBCUTANEOUS | 11 refills | Status: DC

## 2019-11-14 ENCOUNTER — Other Ambulatory Visit: Payer: Self-pay | Admitting: Family Medicine

## 2019-11-14 MED FILL — LISINOPRIL 20 MG TABLET: 20 | 30 days supply | Qty: 45 | Fill #1 | Status: TO

## 2019-11-16 ENCOUNTER — Telehealth: Payer: Self-pay | Admitting: Family Medicine

## 2019-11-16 ENCOUNTER — Other Ambulatory Visit: Payer: Self-pay

## 2019-11-16 ENCOUNTER — Other Ambulatory Visit: Payer: Self-pay | Admitting: Family Medicine

## 2019-11-16 DIAGNOSIS — I440 Atrioventricular block, first degree: Secondary | ICD-10-CM

## 2019-11-16 DIAGNOSIS — I639 Cerebral infarction, unspecified: Secondary | ICD-10-CM

## 2019-11-16 DIAGNOSIS — Z8673 Personal history of transient ischemic attack (TIA), and cerebral infarction without residual deficits: Secondary | ICD-10-CM

## 2019-11-16 DIAGNOSIS — E119 Type 2 diabetes mellitus without complications: Secondary | ICD-10-CM

## 2019-11-16 MED ORDER — APIXABAN 5 MG PO TABS: 5.0000 mg | ORAL_TABLET | Freq: Two times a day (BID) | ORAL | 6 refills | Status: DC

## 2019-11-16 MED ORDER — APIXABAN 5 MG PO TABS: 5.0000 mg | ORAL_TABLET | Freq: Once | ORAL | 0 refills | Status: DC

## 2019-11-16 MED FILL — ELIQUIS 5 MG TABLET: 5 | 1 days supply | Qty: 1 | Fill #0

## 2019-11-16 NOTE — Telephone Encounter (Signed)
Done

## 2019-11-17 ENCOUNTER — Telehealth: Payer: Self-pay | Admitting: Family Medicine

## 2019-11-17 NOTE — Telephone Encounter (Signed)
rx refill for eliquis. Only has one pill left

## 2019-11-18 MED FILL — ELIQUIS 5 MG TABLET: 5 | 30 days supply | Qty: 60 | Fill #0

## 2019-11-22 NOTE — Telephone Encounter (Signed)
Refill was sent in on 11-16-2019

## 2019-11-23 DIAGNOSIS — L0231 Cutaneous abscess of buttock: Secondary | ICD-10-CM | POA: Diagnosis not present

## 2019-11-24 MED FILL — CLINDAMYCIN HCL 300 MG CAPS: 300 | 7 days supply | Qty: 21 | Fill #0

## 2019-11-29 ENCOUNTER — Other Ambulatory Visit: Payer: Self-pay | Admitting: Family Medicine

## 2019-11-29 DIAGNOSIS — E119 Type 2 diabetes mellitus without complications: Secondary | ICD-10-CM

## 2019-11-29 MED FILL — AMLODIPINE BESYLATE 5 MG TA: 5 | 30 days supply | Qty: 30 | Fill #1 | Status: TO

## 2019-11-29 MED FILL — GEMFIBROZIL 600 MG TAB: 600 | 30 days supply | Qty: 60 | Fill #5 | Status: TO

## 2019-11-29 MED FILL — FLECAINIDE ACETATE 50 MG TA: 50 | 30 days supply | Qty: 60 | Fill #2

## 2019-11-29 MED FILL — LANTUS 100 UNITS/ML VIAL: 100 | 20 days supply | Qty: 10 | Fill #3

## 2019-11-29 MED FILL — PANTOPRAZOLE SOD DR 40 MG T: 40 | 30 days supply | Qty: 30 | Fill #2

## 2019-11-29 MED FILL — FOLIC ACID 1 MG TABS: 1 | 30 days supply | Qty: 30 | Fill #1 | Status: TO

## 2019-12-01 ENCOUNTER — Telehealth: Payer: Self-pay | Admitting: Family Medicine

## 2019-12-01 ENCOUNTER — Other Ambulatory Visit: Payer: Self-pay | Admitting: Family Medicine

## 2019-12-01 DIAGNOSIS — E119 Type 2 diabetes mellitus without complications: Secondary | ICD-10-CM

## 2019-12-01 MED ORDER — METFORMIN HCL 500 MG PO TABS: 1000.0000 mg | ORAL_TABLET | Freq: Two times a day (BID) | ORAL | 11 refills | Status: DC

## 2019-12-01 NOTE — Telephone Encounter (Signed)
.  str

## 2019-12-02 ENCOUNTER — Other Ambulatory Visit: Payer: Self-pay

## 2019-12-02 DIAGNOSIS — E119 Type 2 diabetes mellitus without complications: Secondary | ICD-10-CM

## 2019-12-02 MED ORDER — METFORMIN HCL 500 MG PO TABS: 1000.0000 mg | ORAL_TABLET | Freq: Two times a day (BID) | ORAL | 11 refills | Status: AC

## 2019-12-02 MED FILL — METFORMIN HCL 500 MG TABS: 500 | 30 days supply | Qty: 120 | Fill #0

## 2019-12-02 NOTE — Telephone Encounter (Signed)
Sent to NP and CMA

## 2019-12-06 ENCOUNTER — Telehealth: Payer: Self-pay | Admitting: Family Medicine

## 2019-12-06 ENCOUNTER — Other Ambulatory Visit: Payer: Self-pay | Admitting: Family Medicine

## 2019-12-06 DIAGNOSIS — E119 Type 2 diabetes mellitus without complications: Secondary | ICD-10-CM

## 2019-12-06 MED ORDER — SITAGLIPTIN PHOSPHATE 25 MG PO TABS: 25.0000 mg | ORAL_TABLET | Freq: Every day | ORAL | 11 refills | Status: DC

## 2019-12-06 MED FILL — JANUVIA 25 MG TABLET: 25 | 30 days supply | Qty: 30 | Fill #0

## 2019-12-07 NOTE — Telephone Encounter (Signed)
done

## 2019-12-19 MED FILL — ELIQUIS 5 MG TABLET: 5 | 30 days supply | Qty: 60 | Fill #1

## 2019-12-19 MED FILL — LISINOPRIL 20 MG TABLET: 20 | 30 days supply | Qty: 45 | Fill #2 | Status: TO

## 2019-12-21 ENCOUNTER — Other Ambulatory Visit: Payer: Self-pay | Admitting: Family Medicine

## 2019-12-21 DIAGNOSIS — R7309 Other abnormal glucose: Secondary | ICD-10-CM

## 2019-12-21 DIAGNOSIS — E119 Type 2 diabetes mellitus without complications: Secondary | ICD-10-CM

## 2019-12-23 ENCOUNTER — Other Ambulatory Visit: Payer: Self-pay

## 2019-12-23 ENCOUNTER — Ambulatory Visit (INDEPENDENT_AMBULATORY_CARE_PROVIDER_SITE_OTHER): Payer: Medicare Other | Admitting: Family Medicine

## 2019-12-23 ENCOUNTER — Encounter: Payer: Self-pay | Admitting: Family Medicine

## 2019-12-23 VITALS — BP 138/77 | HR 88 | Temp 97.7°F | Resp 17 | Ht 64.0 in | Wt 180.8 lb

## 2019-12-23 DIAGNOSIS — Z23 Encounter for immunization: Secondary | ICD-10-CM

## 2019-12-23 DIAGNOSIS — R8290 Unspecified abnormal findings in urine: Secondary | ICD-10-CM

## 2019-12-23 DIAGNOSIS — Z09 Encounter for follow-up examination after completed treatment for conditions other than malignant neoplasm: Secondary | ICD-10-CM

## 2019-12-23 DIAGNOSIS — E119 Type 2 diabetes mellitus without complications: Secondary | ICD-10-CM | POA: Diagnosis not present

## 2019-12-23 DIAGNOSIS — Z0000 Encounter for general adult medical examination without abnormal findings: Secondary | ICD-10-CM

## 2019-12-23 DIAGNOSIS — Z8673 Personal history of transient ischemic attack (TIA), and cerebral infarction without residual deficits: Secondary | ICD-10-CM

## 2019-12-23 DIAGNOSIS — I10 Essential (primary) hypertension: Secondary | ICD-10-CM

## 2019-12-23 DIAGNOSIS — K5792 Diverticulitis of intestine, part unspecified, without perforation or abscess without bleeding: Secondary | ICD-10-CM | POA: Diagnosis not present

## 2019-12-23 DIAGNOSIS — R809 Proteinuria, unspecified: Secondary | ICD-10-CM

## 2019-12-23 DIAGNOSIS — R739 Hyperglycemia, unspecified: Secondary | ICD-10-CM

## 2019-12-23 DIAGNOSIS — R531 Weakness: Secondary | ICD-10-CM

## 2019-12-23 DIAGNOSIS — R7309 Other abnormal glucose: Secondary | ICD-10-CM | POA: Diagnosis not present

## 2019-12-23 DIAGNOSIS — I440 Atrioventricular block, first degree: Secondary | ICD-10-CM

## 2019-12-23 LAB — POCT URINALYSIS DIPSTICK
Bilirubin, UA: NEGATIVE
Blood, UA: NEGATIVE
Glucose, UA: NEGATIVE
Ketones, UA: NEGATIVE
Nitrite, UA: NEGATIVE
Protein, UA: POSITIVE — AB
Spec Grav, UA: >=1.03 — AB (ref 1.010–1.025)
Urobilinogen, UA: 0.2 E.U./dL
pH, UA: 5.5 (ref 5.0–8.0)

## 2019-12-23 LAB — POCT GLYCOSYLATED HEMOGLOBIN (HGB A1C)
HbA1c POC (<> result, manual entry): 9.3 % (ref 4.0–5.6)
HbA1c, POC (controlled diabetic range): 9.3 % — AB (ref 0.0–7.0)
HbA1c, POC (prediabetic range): 9.3 % — AB (ref 5.7–6.4)
Hemoglobin A1C: 9.3 % — AB (ref 4.0–5.6)

## 2019-12-23 LAB — GLUCOSE, POCT (MANUAL RESULT ENTRY): POC Glucose: 177 mg/dl — AB (ref 70–99)

## 2019-12-23 MED FILL — !LANTUS 100 UNITS/ML VIAL: 100 | 20 days supply | Qty: 10 | Fill #4

## 2019-12-23 NOTE — Progress Notes (Signed)
Patient Caleb Hernandez [facility]   Established Patient Office Visit  Subjective:  Patient ID: Caleb Hernandez, male    DOB: September 21, 1960  Age: 59 y.o. MRN: [id number]  CC:  Chief Complaint  Patient presents with  . Follow-up    Pt states he has no question or concerns to speak about today.    HPI Caleb Hernandez is a 59 year old male who presents for Follow Up today.    Patient Active Problem List   Diagnosis Date Noted  . Left-sided weakness 06/12/2019  . Hemoglobin A1C greater than 9%, indicating poor diabetic control 09/21/2018  . S/P stroke due to cerebrovascular disease 09/21/2018  . Type 2 diabetes mellitus with insulin therapy ([place]) 09/09/2018  . Status post placement of implantable loop recorder 11/17/2016  . Chronic anticoagulation after embolic stroke 29/93/7169  . Cerebellar stroke, acute ([place]) 10/05/2016  . Positive RPR test 09/27/2016  . Alcohol use disorder 09/26/2016  . 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 Abdominal pain 09/17/2016  . Essential hypertension 09/17/2016  . Nondependent alcohol abuse 09/17/2016  . Tobacco use 09/17/2016  . Bilateral bunions 12/18/2014  . Onychomycosis of toenail 12/18/2014  . Tinea pedis of both feet 12/18/2014  . Early cataracts, bilateral 01/03/2014  . Male hypogonadism 11/16/2013  . Hypercholesterolemia 04/13/2013  . Proteinuria 04/13/2013  . 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/2013  . Vitamin D deficiency 04/13/2013  . Allergic rhinitis 08/28/2012  . Corns and callosity 08/28/2012  . Encounter for long-term (current) use of other medications 08/28/2012  . First degree heart block 08/28/2012  . Premature beats 08/28/2012  . Undiagnosed cardiac murmurs 04/20/2012  . Hx of adenomatous polyps of colon 12/01/2011   Current Status: Since his last office visit, he is doing well with no complaints. He denies visual changes, chest pain, cough, shortness of  breath, heart palpitations, and falls. He has occasional headaches and dizziness with position changes. Denies severe headaches, confusion, seizures, double vision, and blurred vision, nausea and vomiting. He denies visual changes, chest pain, cough, shortness of breath, heart palpitations, and falls. He has occasional headaches and dizziness with position changes. Denies severe headaches, confusion, seizures, double vision, and blurred vision, nausea and vomiting. He has not been monitoring his blood glucose levels regularly lately. He denies fatigue, frequent urination, excessive hunger, excessive thirst, weight gain, weight loss, and poor wound healing. He continues to check his feet regularly. He is in the process of getting a new PCP closer to his home, as he currently lives on the [place] of [place], [place] and gas prices are increasing. He denies fevers, chills, recent infections, weight loss, and night sweats. Denies GI problems such as diarrhea, and constipation. He has no reports of blood in stools, dysuria and hematuria. No depression or anxiety reported today. He is taking all medications as prescribed. He denies pain today.   Past Medical History:  Diagnosis Date  . Abdominal pain 09/17/2016  . Alcohol use disorder 09/26/2016  . Allergic rhinitis 08/28/2012  . Bilateral bunions 12/18/2014  . Cerebellar stroke, acute ([place]) 10/05/2016  . 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 Corns and callosity 08/28/2012  . Diabetes mellitus without complication ([place])   . Diverticulitis 09/17/2016  . Diverticulitis 09/08/2019  . Early cataracts, bilateral 01/03/2014  . Encounter for long-term (current) use of other medications 08/28/2012  . Essential hypertension 09/17/2016  . First degree heart block 08/28/2012  .  GI bleed 09/17/2016  . High cholesterol   . Hx of adenomatous polyps of colon 09/09/2018  . Hypercholesterolemia 04/13/2013  . Hypertension   . Increased band  cell count 09/26/2016  . Male hypogonadism 11/16/2013  . Nondependent alcohol abuse 09/17/2016  . Onychomycosis of toenail 12/18/2014  . Positive RPR test 09/27/2016  . Premature beats 08/28/2012  . Proteinuria 04/13/2013  . 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/2013  . Tinea pedis 08/28/2012  . Tinea pedis of both feet 12/18/2014  . Tobacco use 09/17/2016  . Type 2 diabetes mellitus, without long-term current use of insulin ([facility]) 09/17/2016  . Undiagnosed cardiac murmurs 04/20/2012  . Vitamin D deficiency 04/13/2013    Past Surgical History:  Procedure Laterality Date  . BACK SURGERY    . COLONOSCOPY  2013  . LOOP RECORDER INSERTION N/A 10/17/2016   Procedure: Loop Recorder Insertion;  Surgeon: Marinus Maw, MD;  Location: [facility];  Service: Cardiovascular;  Laterality: N/A;  . PACEMAKER IMPLANT     June 2018 or June 2019    Family History  Problem Relation Age of Onset  . Heart disease Brother   . Diabetes Mother   . Hypertension Mother   . Diabetes Father   . Hypercalcemia Father   . Stomach cancer Brother   . AAA (abdominal aortic aneurysm) Brother   . Colon cancer Neg Hx   . Esophageal cancer Neg Hx   . Rectal cancer Neg Hx     Social History   Socioeconomic History  . Marital status: Single    Spouse name: Not on file  . Number of children: Not on file  . Years of education: Not on file  . Highest education level: Not on file  Occupational History  . Not on file  Tobacco Use  . Smoking status: Current Every Day Smoker    Types: Cigarettes  . Smokeless tobacco: Never Used  . Tobacco comment: Smoke about 5 cigarrettes per day  Vaping Use  . Vaping Use: Never used  Substance and Sexual Activity  . Alcohol use: Yes    Comment: 40 oz per day/ former  . Drug use: No  . Sexual activity: Yes    Partners: Female  Other Topics Concern  . Not on file  Social History Narrative   Single, unemployed   Uninsured   Hx EtOH   Smoker   No drugs   Social Determinants of  Corporate investment banker Strain:   . Difficulty of Paying Living Expenses: Not on file  Food Insecurity:   . Worried About Programme researcher, broadcasting/film/video in the Last Year: Not on file  . Ran Out of Food in the Last Year: Not on file  Transportation Needs:   . Lack of Transportation (Medical): Not on file  . Lack of Transportation (Non-Medical): Not on file  Physical Activity:   . Days of Exercise per Week: Not on file  . Minutes of Exercise per Session: Not on file  Stress:   . Feeling of Stress : Not on file  Social Connections:   . Frequency of Communication with Friends and Family: Not on file  . Frequency of Social Gatherings with Friends and Family: Not on file  . Attends Religious Services: Not on file  . Active Member of Clubs or Organizations: Not on file  . Attends Banker Meetings: Not on file  . Marital Status: Not on file  Intimate Partner Violence:   . Fear of Current or  Ex-Partner: Not on file  . Emotionally Abused: Not on file  . Physically Abused: Not on file  . Sexually Abused: Not on file    Outpatient Medications Prior to Visit  Medication Sig Dispense Refill  . amLODipine (NORVASC) 5 MG tablet TAKE 1 TABLET (5 MG TOTAL) BY MOUTH DAILY. 30 tablet 2  . apixaban (ELIQUIS) 5 MG TABS tablet Take 1 tablet (5 mg total) by mouth 2 (two) times daily. 60 tablet 6  . blood glucose meter kit and supplies KIT Dispense based on patient and insurance preference. Use up to four times daily as directed. (FOR ICD-10. E.11.8). 1 each 0  . folic acid (FOLVITE) 1 MG tablet TAKE 1 TABLET (1 MG TOTAL) BY MOUTH DAILY. 30 tablet 1  . gemfibrozil (LOPID) 600 MG tablet Take 1 tablet (600 mg total) by mouth 2 (two) times daily before a meal. 180 tablet 1  . glucose blood (TRUE METRIX BLOOD GLUCOSE TEST) test strip USE AS DIRECTED UP TO 4 TIMES DAILY 200 each 2  . insulin detemir (LEVEMIR) 100 UNIT/ML injection Inject 0.5 mLs (50 Units total) into the skin daily. 10 mL 11  . Insulin  Pen Needle (NOVOFINE) 30G X 8 MM MISC Inject 10 each into the skin as needed. 200 each 2  . Insulin Syringe-Needle U-100 28G X 1/2" 1 ML MISC Use to administer insulin once daily. e11.8 300 each 0  . lisinopril (ZESTRIL) 20 MG tablet TAKE 1 & 1/2 TABLETS (30 MG TOTAL) BY MOUTH DAILY. 45 tablet 2  . Magnesium 250 MG TABS Take 1 tablet by mouth daily.    . metFORMIN (GLUCOPHAGE) 500 MG tablet Take 2 tablets (1,000 mg total) by mouth 2 (two) times daily with a meal. 120 tablet 11  . Omega-3 Fatty Acids (FISH OIL) 600 MG CAPS Take 2 capsules by mouth 2 (two) times daily.    . pantoprazole (PROTONIX) 40 MG tablet TAKE 1 TABLET BY MOUTH DAILY. 30 tablet 2  . sitaGLIPtin (JANUVIA) 25 MG tablet Take 1 tablet (25 mg total) by mouth daily. 30 tablet 11  . Thiamine HCl (VITAMIN B-1) 250 MG tablet Take 250 mg by mouth daily.    Karen Chafe INSULIN SYRINGE 30G X 5/16" 1 ML MISC 30 mg.    . TRUEPLUS INSULIN SYRINGE 30G X 5/16" 1 ML MISC USE AS DIRECTED TO ADMINISTER INSULIN DAILY. 100 each 1  . TRUEPLUS LANCETS 30G MISC USE AS DIRECTED UP TO FOUR TIMES DAILY 200 each 0  . Vitamin D, Ergocalciferol, (DRISDOL) 1.25 MG (50000 UNIT) CAPS capsule TAKE 1 CAPSULE BY MOUTH EVERY 7 DAYS. (Patient not taking: Reported on 12/23/2019) 4 capsule 3   No facility-administered medications prior to visit.    No Known Allergies  ROS Review of Systems  Constitutional: Negative.   HENT: Negative.   Eyes: Negative.   Respiratory: Negative.   Cardiovascular: Negative.   Gastrointestinal: Positive for abdominal distention (obese).  Endocrine: Negative.   Genitourinary: Negative.   Musculoskeletal: Positive for arthralgias (generalized joint pain).  Skin: Negative.   Allergic/Immunologic: Negative.   Neurological: Positive for dizziness (occasional ), weakness (left sided weakness) and headaches (occasional).  Hematological: Negative.   Psychiatric/Behavioral: Negative.       Objective:    Physical Exam Vitals and  nursing note reviewed.  Constitutional:      Appearance: Normal appearance.     Comments: cane  HENT:     Head: Normocephalic.     Nose: Nose normal.  Cardiovascular:  Rate and Rhythm: Normal rate and regular rhythm.     Pulses: Normal pulses.     Heart sounds: Normal heart sounds.  Pulmonary:     Effort: Pulmonary effort is normal.     Breath sounds: Normal breath sounds.  Musculoskeletal:     Cervical back: Normal range of motion and neck supple.  Neurological:     Mental Status: He is alert.    BP 138/77 (BP Location: Left Arm, Patient Position: Sitting, Cuff Size: Normal)   Pulse 88   Temp 97.7 F (36.5 C)   Resp 17   Ht $R'5\' 4"'of$  (1.626 m)   Wt 180 lb 12.8 oz (82 kg)   SpO2 99%   BMI 31.03 kg/m  Wt Readings from Last 3 Encounters:  12/23/19 180 lb 12.8 oz (82 kg)  09/23/19 178 lb 6.4 oz (80.9 kg)  06/08/19 181 lb 3.2 oz (82.2 kg)    Health Maintenance Due  Topic Date Due  . PNEUMOCOCCAL POLYSACCHARIDE VACCINE AGE 1-64 HIGH RISK  Never done  . OPHTHALMOLOGY EXAM  Never done  . COVID-19 Vaccine (1) Never done  . FOOT EXAM  12/15/2018    There are no preventive care reminders to display for this patient.  Lab Results  Component Value Date   TSH 0.514 06/21/2018   Lab Results  Component Value Date   WBC 8.4 06/08/2019   HGB 15.4 06/08/2019   HCT 44.6 06/08/2019   MCV 84 06/08/2019   PLT 355 06/08/2019   Lab Results  Component Value Date   NA 141 06/08/2019   K 4.1 06/08/2019   CO2 21 06/08/2019   GLUCOSE 177 (H) 06/08/2019   BUN 8 06/08/2019   CREATININE 0.83 06/08/2019   BILITOT 0.4 06/08/2019   ALKPHOS 94 06/08/2019   AST 12 06/08/2019   ALT 11 06/08/2019   PROT 7.4 06/08/2019   ALBUMIN 4.4 06/08/2019   CALCIUM 10.1 06/08/2019   ANIONGAP 9 09/20/2016   Lab Results  Component Value Date   CHOL 204 (H) 06/08/2019   Lab Results  Component Value Date   HDL 26 (L) 06/08/2019   Lab Results  Component Value Date   LDLCALC 109 (H)  06/08/2019   Lab Results  Component Value Date   TRIG 402 (H) 06/08/2019   Lab Results  Component Value Date   CHOLHDL 7.8 (H) 06/08/2019   Lab Results  Component Value Date   HGBA1C 9.3 (A) 12/23/2019   HGBA1C 9.3 12/23/2019   HGBA1C 9.3 (A) 12/23/2019   HGBA1C 9.3 (A) 12/23/2019      Assessment & Plan:   1. Type 2 diabetes mellitus without complication, without long-term current use of insulin ([place]) He will continue medication as prescribed, to decrease foods/beverages high in sugars and carbs and follow Heart Healthy or DASH diet. Increase physical activity to at least 30 minutes cardio exercise daily.  - Urinalysis Dipstick - POC Glucose (CBG) - POC HgB A1c - Comprehensive metabolic panel; Future - Lipid Panel  2. Hemoglobin A1C greater than 9%, indicating poor diabetic control Stable. Hgb A1c at 9.3 today. Monitor.   3. Hyperglycemia  4. Diverticulitis Stable. No signs or symptoms of recurrence. Monitor.   5. Proteinuria, unspecified type - Microalbumin/Creatinine Ratio, Urine - Urine Culture  6. S/P stroke due to cerebrovascular disease Stable. No signs or symptoms of recurrence reported or noted today.  - Comprehensive metabolic panel; Future - Lipid Panel  7. Essential hypertension The current medical regimen is effective; blood pressure  is stable at 138/77 today; continue present plan and medications as prescribed. He will continue to take medications as prescribed, to decrease high sodium intake, excessive alcohol intake, increase potassium intake, smoking cessation, and increase physical activity of at least 30 minutes of cardio activity daily. He will continue to follow Heart Healthy or DASH diet. - Comprehensive metabolic panel; Future - Lipid Panel  8. First degree heart block Stable. No signs or symptoms of distress noted.  - Comprehensive metabolic panel; Future - Lipid Panel  9. Left-sided weakness Stable.   10. Hypomagnesemia Most recent  level is stable at 1.7. We will re-assess today.  - Magnesium  11. Healthcare maintenance - Flu Vaccine QUAD 6+ mos PF IM (Fluarix Quad PF)  12. Follow up She will follow up in 3 months.   No orders of the defined types were placed in this encounter.   Orders Placed This Encounter  Procedures  . Urine Culture  . Flu Vaccine QUAD 6+ mos PF IM (Fluarix Quad PF)  . Microalbumin/Creatinine Ratio, Urine  . Comprehensive metabolic panel  . Magnesium  . Lipid Panel  . Urinalysis Dipstick  . POC Glucose (CBG)  . POC HgB A1c    Referral Orders  No referral(s) requested today    Kathe Becton,  MSN, FNP-BC [address] [place], [place] [address] [phone] fax  Problem List Items Addressed This Visit      Cardiovascular and Mediastinum   Essential hypertension   Relevant Orders   Comprehensive metabolic panel   Lipid Panel   First degree heart block   Relevant Orders   Comprehensive metabolic panel   Lipid Panel     Endocrine   Hemoglobin A1C greater than 9%, indicating poor diabetic control     Nervous and Auditory   Left-sided weakness     Other   Proteinuria   Relevant Orders   Microalbumin/Creatinine Ratio, Urine   Urine Culture   S/P stroke due to cerebrovascular disease   Relevant Orders   Comprehensive metabolic panel   Lipid Panel    Other Visit Diagnoses    Type 2 diabetes mellitus without complication, without long-term current use of insulin ([facility])    -  Primary   Relevant Orders   Urinalysis Dipstick (Completed)   POC Glucose (CBG) (Completed)   POC HgB A1c (Completed)   Comprehensive metabolic panel   Lipid Panel   Hyperglycemia       Diverticulitis       Hypomagnesemia       Relevant Orders   Magnesium   Healthcare maintenance       Relevant Orders   Flu Vaccine QUAD 6+ mos PF IM (Fluarix Quad PF) (Completed)   Abnormal  urine       Follow up          No orders of the defined types were placed in this encounter.   Follow-up: Return in about 3 months (around 03/24/2020).    Azzie Glatter, FNP

## 2019-12-26 ENCOUNTER — Encounter: Payer: Self-pay | Admitting: Family Medicine

## 2020-01-02 ENCOUNTER — Other Ambulatory Visit: Payer: Self-pay | Admitting: Family Medicine

## 2020-01-02 DIAGNOSIS — E7800 Pure hypercholesterolemia, unspecified: Secondary | ICD-10-CM

## 2020-01-02 DIAGNOSIS — K219 Gastro-esophageal reflux disease without esophagitis: Secondary | ICD-10-CM

## 2020-01-02 MED FILL — METFORMIN HCL 500 MG TABS: 500 | 30 days supply | Qty: 120 | Fill #1

## 2020-01-02 MED FILL — FLECAINIDE ACETATE 50 MG TA: 50 | 30 days supply | Qty: 60 | Fill #3

## 2020-01-02 MED FILL — JANUVIA 25 MG TABLET: 25 | 30 days supply | Qty: 30 | Fill #1

## 2020-01-05 ENCOUNTER — Other Ambulatory Visit: Payer: Self-pay | Admitting: Family Medicine

## 2020-01-05 MED FILL — AMLODIPINE BESYLATE 5 MG TA: 5 | 30 days supply | Qty: 30 | Fill #2 | Status: TO

## 2020-01-06 ENCOUNTER — Telehealth: Payer: Self-pay | Admitting: Family Medicine

## 2020-01-06 ENCOUNTER — Other Ambulatory Visit: Payer: Self-pay | Admitting: Family Medicine

## 2020-01-06 DIAGNOSIS — K219 Gastro-esophageal reflux disease without esophagitis: Secondary | ICD-10-CM

## 2020-01-06 DIAGNOSIS — E7800 Pure hypercholesterolemia, unspecified: Secondary | ICD-10-CM

## 2020-01-06 DIAGNOSIS — I428 Other cardiomyopathies: Secondary | ICD-10-CM | POA: Insufficient documentation

## 2020-01-06 MED ORDER — PANTOPRAZOLE SODIUM 40 MG PO TBEC: 40.0000 mg | DELAYED_RELEASE_TABLET | Freq: Every day | ORAL | 3 refills | Status: DC

## 2020-01-06 MED ORDER — FOLIC ACID 1 MG PO TABS: 1.0000 mg | ORAL_TABLET | Freq: Every day | ORAL | 3 refills | Status: AC

## 2020-01-06 MED ORDER — GEMFIBROZIL 600 MG PO TABS: 600.0000 mg | ORAL_TABLET | Freq: Two times a day (BID) | ORAL | 3 refills | Status: DC

## 2020-01-09 MED FILL — PANTOPRAZOLE SOD DR 40 MG T: 40 | 90 days supply | Qty: 90 | Fill #0

## 2020-01-09 MED FILL — GEMFIBROZIL 600 MG TAB: 600 | 90 days supply | Qty: 180 | Fill #0

## 2020-01-09 NOTE — Telephone Encounter (Signed)
Sent to provider 

## 2020-01-16 ENCOUNTER — Other Ambulatory Visit: Payer: Self-pay | Admitting: Family Medicine

## 2020-01-16 MED FILL — LISINOPRIL 20 MG TABLET: 20 | 30 days supply | Qty: 45 | Fill #0 | Status: TO

## 2020-01-16 MED FILL — LANTUS 100 UNITS/ML VIAL: 100 | 80 days supply | Qty: 40 | Fill #5

## 2020-01-16 MED FILL — ELIQUIS 5 MG TABLET: 5 | 90 days supply | Qty: 180 | Fill #2

## 2020-02-03 ENCOUNTER — Other Ambulatory Visit: Payer: Self-pay | Admitting: Family Medicine

## 2020-02-03 MED FILL — JANUVIA 25 MG TABLET: 25 | 30 days supply | Qty: 30 | Fill #2

## 2020-02-03 MED FILL — METFORMIN HCL 500 MG TABS: 500 | 30 days supply | Qty: 120 | Fill #2

## 2020-02-03 MED FILL — FLECAINIDE ACETATE 50 MG TA: 50 | 30 days supply | Qty: 60 | Fill #4

## 2020-02-08 ENCOUNTER — Telehealth: Payer: Self-pay | Admitting: Family Medicine

## 2020-02-08 MED FILL — TRUEPLUS SYR 1ML 30GX5/16: 30G X 5/16" | 90 days supply | Qty: 100 | Fill #1

## 2020-02-09 ENCOUNTER — Other Ambulatory Visit: Payer: Self-pay | Admitting: Family Medicine

## 2020-02-09 DIAGNOSIS — I10 Essential (primary) hypertension: Secondary | ICD-10-CM

## 2020-02-09 MED ORDER — AMLODIPINE BESYLATE 5 MG PO TABS: 5.0000 mg | ORAL_TABLET | Freq: Every day | ORAL | 3 refills | Status: DC

## 2020-02-09 MED FILL — LISINOPRIL 20 MG TABLET: 20 | 30 days supply | Qty: 45 | Fill #1 | Status: TO

## 2020-02-09 MED FILL — AMLODIPINE BESYLATE 5 MG TA: 5 | 30 days supply | Qty: 30 | Fill #0

## 2020-02-09 NOTE — Telephone Encounter (Signed)
Done

## 2020-03-26 ENCOUNTER — Ambulatory Visit: Payer: Medicare HMO | Admitting: Family Medicine

## 2020-03-27 ENCOUNTER — Ambulatory Visit: Payer: Medicare HMO | Admitting: Family Medicine

## 2020-04-12 DIAGNOSIS — H43393 Other vitreous opacities, bilateral: Secondary | ICD-10-CM | POA: Insufficient documentation

## 2020-04-12 DIAGNOSIS — H2513 Age-related nuclear cataract, bilateral: Secondary | ICD-10-CM | POA: Insufficient documentation

## 2020-06-23 ENCOUNTER — Other Ambulatory Visit: Payer: Self-pay

## 2020-12-07 ENCOUNTER — Telehealth: Payer: Self-pay

## 2020-12-07 NOTE — Telephone Encounter (Signed)
Called pt to schedule AWV, could not leave voice msg since there was not an option to leave one.

## 2020-12-14 ENCOUNTER — Telehealth: Payer: Self-pay | Admitting: Family Medicine

## 2020-12-14 NOTE — Telephone Encounter (Signed)
LVM for pt to rtn my call to [phone] to schedule AWV. Please schedule this appt if pt calls the office.

## 2021-12-09 ENCOUNTER — Encounter: Payer: Self-pay | Admitting: Internal Medicine

## 2021-12-31 ENCOUNTER — Encounter: Payer: Self-pay | Admitting: Physician Assistant

## 2022-01-30 ENCOUNTER — Ambulatory Visit (INDEPENDENT_AMBULATORY_CARE_PROVIDER_SITE_OTHER): Payer: Medicare Other | Admitting: Physician Assistant

## 2022-01-30 ENCOUNTER — Telehealth: Payer: Self-pay

## 2022-01-30 ENCOUNTER — Encounter: Payer: Self-pay | Admitting: Physician Assistant

## 2022-01-30 VITALS — BP 128/82 | HR 105 | Ht 65.0 in | Wt 181.0 lb

## 2022-01-30 DIAGNOSIS — Z7901 Long term (current) use of anticoagulants: Secondary | ICD-10-CM

## 2022-01-30 DIAGNOSIS — Z86010 Personal history of colonic polyps: Secondary | ICD-10-CM | POA: Diagnosis not present

## 2022-01-30 DIAGNOSIS — I482 Chronic atrial fibrillation, unspecified: Secondary | ICD-10-CM | POA: Diagnosis not present

## 2022-01-30 DIAGNOSIS — Z8673 Personal history of transient ischemic attack (TIA), and cerebral infarction without residual deficits: Secondary | ICD-10-CM | POA: Diagnosis not present

## 2022-01-30 MED ORDER — NA SULFATE-K SULFATE-MG SULF 17.5-3.13-1.6 GM/177ML PO SOLN: 1.0000 | Freq: Once | ORAL | 0 refills | Status: DC

## 2022-01-30 MED ORDER — NA SULFATE-K SULFATE-MG SULF 17.5-3.13-1.6 GM/177ML PO SOLN: 1.0000 | Freq: Once | ORAL | 0 refills | Status: AC

## 2022-01-30 NOTE — Progress Notes (Signed)
Chief Complaint: Discuss colonoscopy  HPI:    Mr. Caleb Hernandez is a 61 year old male with a past medical history as listed below including stroke on Eliquis, intermittent high-grade AV block with pacemaker in place, diverticulitis, GI bleed and multiple others, known to Dr. Carlean Purl, who was referred to me by Azzie Glatter, FNP for discussion of a surveillance colonoscopy.    09/27/2018 colonoscopy for history of adenomatous polyps with 5 diminutive polyps in the sigmoid colon, descending colon, transverse colon, ascending colon and cecum.  Diverticulosis in the transverse colon, ascending colon and cecum, external and internal hemorrhoids.  Pathology showed adenomas and recall was placed for 09/26/2021.    12/04/2021 office visit with patient's cardiologist Adrian Prows for intermittent high-grade A-V block with dual-chamber pacemaker functioning adequately, history of A-fib with questions of runs of A. tach versus sinus tachycardia, was recommended to have a monitor placed.    Today, the patient presents to clinic and tells me that he knows he is due for his colonoscopy.  He is not having any GI issues or complaints.  Does tell me he followed with his cardiologist after a monitor was placed and "everything was good".  We do not have those records.    Denies fever, chills, weight loss, change in bowel habits, abdominal pain, nausea, vomiting, heartburn or reflux.  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 ([place]) 09/26/2016   Corns and callosity 08/28/2012   Diabetes mellitus without complication ([place])    Diverticulitis 09/17/2016   Diverticulitis 09/08/2019   Early cataracts, bilateral 01/03/2014   Encounter for long-term (current) use of other medications 08/28/2012   Essential  hypertension 09/17/2016   First degree heart block 08/28/2012   GI bleed 09/17/2016   High cholesterol    Hx of adenomatous polyps of colon 09/09/2018   Hypercholesterolemia 04/13/2013   Hypertension    Increased band cell count 09/26/2016   Male hypogonadism 11/16/2013   Nondependent alcohol abuse 09/17/2016   Onychomycosis of toenail 12/18/2014   Positive RPR test 09/27/2016   Premature beats 08/28/2012   Proteinuria 04/13/2013   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/2013   Tinea pedis 08/28/2012   Tinea pedis of both feet 12/18/2014   Tobacco use 09/17/2016   Type 2 diabetes mellitus, without long-term current use of insulin ([place]) 09/17/2016   Undiagnosed cardiac murmurs 04/20/2012   Vitamin D deficiency 04/13/2013    Past Surgical History:  Procedure Laterality Date   BACK SURGERY     COLONOSCOPY  2013   LOOP RECORDER INSERTION N/A 10/17/2016   Procedure: Loop Recorder Insertion;  Surgeon: Evans Lance, MD;  Location: [facility];  Service: Cardiovascular;  Laterality: N/A;   PACEMAKER IMPLANT     June 2018 or June 2019    Current Outpatient Medications  Medication Sig Dispense Refill   amLODipine (NORVASC) 5 MG tablet TAKE 1 TABLET (5 MG TOTAL) BY MOUTH DAILY. 90 tablet 3   apixaban (ELIQUIS) 5 MG TABS tablet TAKE 1 TABLET (5 MG TOTAL) BY MOUTH 2 (TWO) TIMES DAILY. 60 tablet 6   blood glucose meter kit and supplies KIT Dispense based on patient and insurance preference. Use up to four times daily as directed. (FOR ICD-10. E.11.8). 1 each 0   folic acid (FOLVITE) 1 MG tablet Take  1 tablet (1 mg total) by mouth daily. 90 tablet 3   gemfibrozil (LOPID) 600 MG tablet TAKE 1 TABLET BY MOUTH TWO TIMES DAILY BEFORE A MEAL 180 tablet 3   glucose blood (TRUE METRIX BLOOD GLUCOSE TEST) test strip USE AS DIRECTED UP TO 4 TIMES DAILY 200 each 2   insulin detemir (LEVEMIR) 100 UNIT/ML injection Inject 0.5 mLs (50 Units total) into the skin daily. 10 mL 11   Insulin Pen Needle (NOVOFINE) 30G X 8 MM MISC  Inject 10 each into the skin as needed. 200 each 2   Insulin Syringe-Needle U-100 28G X 1/2" 1 ML MISC Use to administer insulin once daily. e11.8 300 each 0   lisinopril (ZESTRIL) 20 MG tablet TAKE 1 & 1/2 TABLETS (30 MG TOTAL) BY MOUTH DAILY. 45 tablet 2   Magnesium 250 MG TABS Take 1 tablet by mouth daily.     metFORMIN (GLUCOPHAGE) 500 MG tablet Take 2 tablets (1,000 mg total) by mouth 2 (two) times daily with a meal. 120 tablet 11   Omega-3 Fatty Acids (FISH OIL) 600 MG CAPS Take 2 capsules by mouth 2 (two) times daily.     pantoprazole (PROTONIX) 40 MG tablet TAKE 1 TABLET BY MOUTH ONCE A DAY 90 tablet 3   sitaGLIPtin (JANUVIA) 25 MG tablet TAKE 1 TABLET (25 MG TOTAL) BY MOUTH DAILY. 30 tablet 11   Thiamine HCl (VITAMIN B-1) 250 MG tablet Take 250 mg by mouth daily.     TRUEPLUS INSULIN SYRINGE 30G X 5/16" 1 ML MISC 30 mg.     TRUEPLUS LANCETS 30G MISC USE AS DIRECTED UP TO FOUR TIMES DAILY 200 each 0   Vitamin D, Ergocalciferol, (DRISDOL) 1.25 MG (50000 UNIT) CAPS capsule TAKE 1 CAPSULE BY MOUTH EVERY 7 DAYS. (Patient not taking: Reported on 12/23/2019) 4 capsule 3   No current facility-administered medications for this visit.    Allergies as of 01/30/2022   (No Known Allergies)    Family History  Problem Relation Age of Onset   Heart disease Brother    Diabetes Mother    Hypertension Mother    Diabetes Father    Hypercalcemia Father    Stomach cancer Brother    AAA (abdominal aortic aneurysm) Brother    Colon cancer Neg Hx    Esophageal cancer Neg Hx    Rectal cancer Neg Hx     Social History   Socioeconomic History   Marital status: Single    Spouse name: Not on file   Number of children: Not on file   Years of education: Not on file   Highest education level: Not on file  Occupational History   Not on file  Tobacco Use   Smoking status: Every Day    Types: Cigarettes   Smokeless tobacco: Never   Tobacco comments:    Smoke about 5 cigarrettes per day  Vaping  Use   Vaping Use: Never used  Substance and Sexual Activity   Alcohol use: Yes    Comment: 40 oz per day/ former   Drug use: No   Sexual activity: Yes    Partners: Female  Other Topics Concern   Not on file  Social History Narrative   Single, unemployed   Uninsured   Hx EtOH   Smoker   No drugs   Social Determinants of Radio broadcast assistant Strain: Not on file  Food Insecurity: Not on file  Transportation Needs: Not on file  Physical Activity: Not on  file  Stress: Not on file  Social Connections: Not on file  Intimate Partner Violence: Not on file    Review of Systems:    Constitutional: No weight loss, fever or chills Skin: No rash Cardiovascular: No chest pain Respiratory: No SOB  Gastrointestinal: See HPI and otherwise negative Genitourinary: No dysuria  Neurological: No headache, dizziness or syncope Musculoskeletal: +residual deficits from stroke Hematologic: No bleeding or bruising Psychiatric: No history of depression or anxiety   Physical Exam:  Vital signs: BP 128/82   Pulse (!) 105   Ht 5' 5" (1.651 m)   Wt 181 lb (82.1 kg)   SpO2 97%   BMI 30.12 kg/m    Constitutional:   Pleasant AA male appears to be in NAD, Well developed, Well nourished, alert and cooperative +residual facial droop Head:  Normocephalic and atraumatic. Eyes:   PEERL, EOMI. No icterus. Conjunctiva pink. Ears:  Normal auditory acuity. Neck:  Supple Throat: Oral cavity and pharynx without inflammation, swelling or lesion.  Respiratory: Respirations even and unlabored. Lungs clear to auscultation bilaterally.   No wheezes, crackles, or rhonchi.  Cardiovascular: Normal S1, S2. No MRG. Regular rate and rhythm. No peripheral edema, cyanosis or pallor.  Gastrointestinal:  Soft, nondistended, nontender. No rebound or guarding. Normal bowel sounds. No appreciable masses or hepatomegaly. Rectal:  Not performed.  Msk:  Symmetrical without gross deformities. Without edema, no  deformity or joint abnormality.  +ambulates with cane Neurologic:  Alert and  oriented x4;  grossly normal neurologically.  Skin:   Dry and intact without significant lesions or rashes. Psychiatric:  Demonstrates good judgement and reason without abnormal affect or behaviors.  RELEVANT LABS AND IMAGING: CBC    Component Value Date/Time   WBC 8.4 06/08/2019 0851   WBC 9.0 10/02/2016 0907   RBC 5.29 06/08/2019 0851   RBC 4.81 10/02/2016 0907   HGB 15.4 06/08/2019 0851   HCT 44.6 06/08/2019 0851   PLT 355 06/08/2019 0851   MCV 84 06/08/2019 0851   MCH 29.1 06/08/2019 0851   MCH 28.9 10/02/2016 0907   MCHC 34.5 06/08/2019 0851   MCHC 33.3 10/02/2016 0907   RDW 13.2 06/08/2019 0851   LYMPHSABS 4.3 (H) 06/21/2018 0936   MONOABS 360 10/02/2016 0907   EOSABS 0.3 06/21/2018 0936   BASOSABS 0.0 06/21/2018 0936    CMP     Component Value Date/Time   NA 141 06/08/2019 0845   K 4.1 06/08/2019 0845   CL 103 06/08/2019 0845   CO2 21 06/08/2019 0845   GLUCOSE 177 (H) 06/08/2019 0845   GLUCOSE 500 (HH) 02/03/2017 1016   BUN 8 06/08/2019 0845   CREATININE 0.83 06/08/2019 0845   CREATININE 0.76 02/03/2017 1016   CALCIUM 10.1 06/08/2019 0845   PROT 7.4 06/08/2019 0845   ALBUMIN 4.4 06/08/2019 0845   AST 12 06/08/2019 0845   ALT 11 06/08/2019 0845   ALKPHOS 94 06/08/2019 0845   BILITOT 0.4 06/08/2019 0845   GFRNONAA 98 06/08/2019 0845   GFRNONAA 103 02/03/2017 1016   GFRAA 113 06/08/2019 0845   GFRAA 119 02/03/2017 1016    Assessment: 1.  History of adenomatous polyps: Last colonoscopy in 2020 with repeat recommended in 3 years 2.  History of A-fib and stroke: On Eliquis 3.  Recent heart arrhythmia: Patient was placed on a 7-day monitor, I cannot see results with patient tells me they were normal, will confirm with cardiologist  Plan: 1.  Discussed the patient that I will send his cardiologist  Dr. Ola Spurr a note in regards to cardiac and Eliquis clearance.  Did advise him  that we will need to hold his Eliquis for 2 days prior to time of procedure.  We will communicate with his prescribing physician to make sure this is acceptable for him.  Also will make sure he is cleared from a cardiac standpoint given recent history of arrhythmia. 2.  Patient scheduled for a surveillance colonoscopy given history of adenomatous polyps in the [facility] with Dr. Carlean Purl.  Did provide the patient a detailed list of risks for the procedure and he agrees to proceed.  I do believe patient is okay to proceed in the [facility] but we are confirming with his cardiologist. 3.  Patient to follow in clinic per recommendations after time of procedure.  Ellouise Newer, PA-C [facility] 01/30/2022, 9:37 AM  Cc: Azzie Glatter, FNP

## 2022-01-30 NOTE — Telephone Encounter (Signed)
Dr. Ola Spurr called and stated he only received the cover sheet and still needs the rest in order to give clearance. Please advise.

## 2022-01-30 NOTE — Telephone Encounter (Signed)
I have faxed a cardiac clearance request to Dr Ola Spurr. Will await return communication.

## 2022-01-30 NOTE — Patient Instructions (Signed)
_______________________________________________________  If you are age 60 or older, your body mass index should be between 23-30. Your Body mass index is 30.12 kg/m. If this is out of the aforementioned range listed, please consider follow up with your Primary Care Provider.  If you are age 48 or younger, your body mass index should be between 19-25. Your Body mass index is 30.12 kg/m. If this is out of the aformentioned range listed, please consider follow up with your Primary Care Provider.   ________________________________________________________  The [facility] providers would like to encourage you to use [facility] to communicate with providers for non-urgent requests or questions.  Due to long hold times on the telephone, sending your provider a message by [facility] may be a faster and more efficient way to get a response.  Please allow 48 business hours for a response.  Please remember that this is for non-urgent requests.  _______________________________________________________  Dennis Bast have been scheduled for a colonoscopy. Please follow written instructions given to you at your visit today.  Please pick up your prep supplies at the pharmacy within the next 1-3 days. If you use inhalers (even only as needed), please bring them with you on the day of your procedure.  Due to recent changes in healthcare laws, you may see the results of your imaging and laboratory studies on MyChart before your provider has had a chance to review them.  We understand that in some cases there may be results that are confusing or concerning to you. Not all laboratory results come back in the same time frame and the provider may be waiting for multiple results in order to interpret others.  Please give [place] 48 hours in order for your provider to thoroughly review all the results before contacting the office for clarification of your results.    You will be contacted by our office prior to your procedure for directions  on holding your Eliquis.  If you do not hear from our office 1 week prior to your scheduled procedure, please call [phone] to discuss.    It was a pleasure to see you today!  Thank you for trusting me with your gastrointestinal care!

## 2022-01-30 NOTE — Telephone Encounter (Signed)
I have been trying to fax the clearance all day and it always says busy.

## 2022-01-31 NOTE — Telephone Encounter (Signed)
Alternative fax number provided from the office and faxed was sent

## 2022-02-11 NOTE — Telephone Encounter (Signed)
Return communication received, placed in the in box for J.Lemmon

## 2022-02-12 NOTE — Telephone Encounter (Signed)
Return summarized communication is as follows: from a cardiac standpoint and his latest visit on 9-13-+2023 he seemed well medically optimized, as long as there have been no changes in his general cardiac clearance should be low. His Eliquis should be held 48 hours prior and resumed as soon as GI is satisfied with hemostasis.

## 2022-02-12 NOTE — Telephone Encounter (Signed)
Left a message for a return call.

## 2022-02-21 NOTE — Telephone Encounter (Signed)
Left a message to return call.  

## 2022-02-24 NOTE — Telephone Encounter (Signed)
Patient has been notified and aware to hold Eliquis for 48 hours before his procedure.

## 2022-03-10 ENCOUNTER — Telehealth: Payer: Self-pay | Admitting: *Deleted

## 2022-03-10 NOTE — Telephone Encounter (Signed)
Yesi,  This pt's cardiac EF is below the [place] standard, her procedure will need to be done at the hospital.  Thanks,  Osvaldo Angst

## 2022-03-11 ENCOUNTER — Encounter: Payer: Self-pay | Admitting: Gastroenterology

## 2022-03-13 NOTE — Telephone Encounter (Signed)
Caleb Hernandez - please contact this patient and explain his heart is now pumping below the acceptable level for the [place]  He needs an appointment with me in Feb or March to check on him again and decide timing of next colonoscopy - but we need to put that on hold now

## 2022-03-13 NOTE — Telephone Encounter (Signed)
Procedure Canceled: Left message for pt to call back

## 2022-03-13 NOTE — Telephone Encounter (Signed)
Dr Carlean Purl please review and advise on note from anesthesia.

## 2022-03-14 NOTE — Telephone Encounter (Signed)
Left message for pt to call back  °

## 2022-03-14 NOTE — Telephone Encounter (Signed)
Pt made aware of Dr. Carlean Purl recommendations: Procedure was canceled: Pt made aware Pt was scheduled for an office visit on 04/25/2022 with Dr. Carlean Purl at 8:50 am: Pt made aware:  Pt verbalized understanding with all questions answered.

## 2022-03-20 ENCOUNTER — Encounter: Payer: Medicare Other | Admitting: Internal Medicine

## 2022-04-25 ENCOUNTER — Ambulatory Visit (INDEPENDENT_AMBULATORY_CARE_PROVIDER_SITE_OTHER): Payer: 59 | Admitting: Internal Medicine

## 2022-04-25 ENCOUNTER — Encounter: Payer: Self-pay | Admitting: Internal Medicine

## 2022-04-25 VITALS — BP 140/82 | HR 100 | Ht 64.0 in | Wt 189.2 lb

## 2022-04-25 DIAGNOSIS — Z7901 Long term (current) use of anticoagulants: Secondary | ICD-10-CM

## 2022-04-25 DIAGNOSIS — I5020 Unspecified systolic (congestive) heart failure: Secondary | ICD-10-CM | POA: Diagnosis not present

## 2022-04-25 DIAGNOSIS — Z86010 Personal history of colonic polyps: Secondary | ICD-10-CM | POA: Diagnosis not present

## 2022-04-25 DIAGNOSIS — I480 Paroxysmal atrial fibrillation: Secondary | ICD-10-CM

## 2022-04-25 NOTE — Patient Instructions (Signed)
Our plan is to recheck things after you see cardiology in April. Once you see cardiology  should be able to see those notes and come up with a plan on scheduling colonoscopy.  If you do not hear from me by June please call me.  I appreciate the opportunity to care for you. Gatha Mayer, MD, [facility]

## 2022-04-25 NOTE — Progress Notes (Signed)
Caleb Hernandez 62 y.o. 24-Jan-1961 [id number]  Assessment & Plan:   Encounter Diagnoses  Name Primary?   History of adenomatous polyp of colon Yes   Chronic anticoagulation    Paroxysmal atrial fibrillation (HCC)    Systolic congestive heart failure, unspecified HF chronicity ([place])    The patient had an admission to the hospital in December with acute respiratory failure and his ejection fraction was 20%.  He is improved at this point.  He needs a surveillance colonoscopy for polyp follow-up but this is not urgent.  He takes anticoagulation for paroxysmal atrial fibrillation.  He has a cardiology follow-up in April.  If his ejection fraction improves he may be able to have his colonoscopy in our endoscopy center as opposed to having do it at the hospital.  At any rate I want him to see cardiology for a follow-up prior to scheduling any sedated colonoscopy.  He and his sister understand and agree with this plan.  I will keep him on my procedure list and follow-up later this year.    Subjective:   Chief Complaint: History of colon polyps, due for colonoscopy  HPI 62 year old African-American man with COPD and PAF status post embolic stroke on anticoagulation, who was hospitalized in early December at [facility] with pneumonia and respiratory failure and was found to have an ejection fraction of 20%.  He was originally planned to have a colonoscopy for surveillance in our endoscopy center but when his EF went below the minimum threshold of 35% that was canceled and I arranged for office visit.  He is feeling better though still has some dyspnea on exertion but no orthopnea or paroxysmal nocturnal dyspnea.  He has a cardiology follow-up scheduled for April.  He is not having any bowel habit changes or rectal bleeding.  Hemoglobin was 13.4 with normal 14 and up at discharge labs in December.  Polyp history: 2 subcentimeter adenomas screening colonoscopy September 2013, [place]  [facility] 09/27/2018 5 diminutive polyps removed. 3+ adenomas No Known Allergies Current Meds  Medication Sig   blood glucose meter kit and supplies KIT Dispense based on patient and insurance preference. Use up to four times daily as directed. (FOR ICD-10. E.11.8).   folic acid (FOLVITE) 1 MG tablet Take 1 tablet (1 mg total) by mouth daily.   glucose blood (TRUE METRIX BLOOD GLUCOSE TEST) test strip USE AS DIRECTED UP TO 4 TIMES DAILY   insulin detemir (LEVEMIR) 100 UNIT/ML injection Inject 0.5 mLs (50 Units total) into the skin daily.   Insulin Pen Needle (NOVOFINE) 30G X 8 MM MISC Inject 10 each into the skin as needed.   Insulin Syringe-Needle U-100 28G X 1/2" 1 ML MISC Use to administer insulin once daily. e11.8   lisinopril (ZESTRIL) 20 MG tablet TAKE 1 & 1/2 TABLETS (30 MG TOTAL) BY MOUTH DAILY.   Magnesium 250 MG TABS Take 1 tablet by mouth daily.   metFORMIN (GLUCOPHAGE) 500 MG tablet Take 2 tablets (1,000 mg total) by mouth 2 (two) times daily with a meal.   Omega-3 Fatty Acids (FISH OIL) 600 MG CAPS Take 2 capsules by mouth 2 (two) times daily.   Thiamine HCl (VITAMIN B-1) 250 MG tablet Take 250 mg by mouth daily.   TRUEPLUS INSULIN SYRINGE 30G X 5/16" 1 ML MISC 30 mg.   TRUEPLUS LANCETS 30G MISC USE AS DIRECTED UP TO FOUR TIMES DAILY   Vitamin D, Ergocalciferol, (DRISDOL) 1.25 MG (50000 UNIT) CAPS capsule TAKE 1 CAPSULE BY  MOUTH EVERY 7 DAYS.   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 ([place]) 09/26/2016   Corns and callosity 08/28/2012   Diabetes mellitus without complication ([place])    Diverticulitis 09/17/2016   Diverticulitis 09/08/2019   Early cataracts, bilateral 01/03/2014   Encounter for long-term (current) use of other medications 08/28/2012   Essential  hypertension 09/17/2016   First degree heart block 08/28/2012   GI bleed 09/17/2016   High cholesterol    Hx of adenomatous polyps of colon 09/09/2018   Hypercholesterolemia 04/13/2013   Hypertension    Increased band cell count 09/26/2016   Male hypogonadism 11/16/2013   Nondependent alcohol abuse 09/17/2016   Onychomycosis of toenail 12/18/2014   Positive RPR test 09/27/2016   Premature beats 08/28/2012   Proteinuria 04/13/2013   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/2013   Tinea pedis 08/28/2012   Tinea pedis of both feet 12/18/2014   Tobacco use 09/17/2016   Type 2 diabetes mellitus, without long-term current use of insulin ([place]) 09/17/2016   Undiagnosed cardiac murmurs 04/20/2012   Vitamin D deficiency 04/13/2013   Past Surgical History:  Procedure Laterality Date   BACK SURGERY     COLONOSCOPY  2013   LOOP RECORDER INSERTION N/A 10/17/2016   Procedure: Loop Recorder Insertion;  Surgeon: Evans Lance, MD;  Location: [facility];  Service: Cardiovascular;  Laterality: N/A;   PACEMAKER IMPLANT     June 2018 or June 2019   Social History   Social History Narrative   Single, unemployed   Uninsured   Hx EtOH   Smoker   No drugs   family history includes AAA (abdominal aortic aneurysm) in his brother; Diabetes in his father and mother; Heart disease in his brother; Hypercalcemia in his father; Hypertension in his mother; Stomach cancer in his brother.   Review of Systems As per HPI  Objective:   Physical Exam '@BP'$  (!) 140/82   Pulse 100   Ht '5\' 4"'$  (1.626 m)   Wt 189 lb 4 oz (85.8 kg)   BMI 32.48 kg/m @  General:  NAD Eyes:   anicteric Lungs:  clear Heart::  S1S2 no rubs, murmurs or gallops Abdomen:  soft and nontender, BS+ Ext:   no edema, cyanosis or clubbing    Data Reviewed:  Hospitalization records from December 2023

## 2022-07-23 ENCOUNTER — Telehealth: Payer: Self-pay | Admitting: Internal Medicine

## 2022-07-23 NOTE — Telephone Encounter (Signed)
Patient called regarding scheduling a colonoscopy. Wants to speak specifically to [facility]. Please advise, thank you.

## 2022-07-23 NOTE — Telephone Encounter (Signed)
Tried to contact pt using home  and cell number. Both numbers rang and then directly to busy signal.  Unable to leave voice mail

## 2022-07-24 NOTE — Telephone Encounter (Signed)
PT is calling to speak to nurse about procedure

## 2022-07-28 NOTE — Telephone Encounter (Signed)
Tried to contact pt using home  and cell number. Both numbers rang and then directly to busy signal.  Unable to leave voice mail 

## 2022-07-29 NOTE — Telephone Encounter (Signed)
Tried to contact pt using home  and cell number. Both numbers rang and then directly to busy signal.  Unable to leave voice mail 

## 2022-07-30 NOTE — Telephone Encounter (Signed)
Tried to contact pt using home  and cell number. Both numbers rang and then directly to busy signal.  Unable to leave voice mail       Unable to reach pt after multiple attempts. Letter was created and sent to pt via mail.  Pt verbalized understanding with all questions answered.

## 2022-08-06 NOTE — Telephone Encounter (Signed)
Left message for pt to call back  °

## 2022-08-06 NOTE — Telephone Encounter (Signed)
Patient called regarding colonoscopy. Requesting a call back on [phone] to be able to discuss. Please advise, thank you.

## 2022-08-06 NOTE — Telephone Encounter (Signed)
Patient called requesting to speak. States to now reach him on [phone]. Please advise, thank you.

## 2022-08-06 NOTE — Telephone Encounter (Signed)
Pt stated that he was calling about scheduling a colonoscopy. Pt chart was reviewed and noted where he is seeing Cardiology and there recommendations were for him to have an Echo completed. Pt was notified of their recommendations and stated that he is scheduled for 09/23/2022. Pt was notified to give our office a call back after Echo completed.  Pt verbalized understanding with all questions answered.

## 2022-09-03 ENCOUNTER — Other Ambulatory Visit (HOSPITAL_COMMUNITY): Payer: Self-pay

## 2022-09-30 ENCOUNTER — Other Ambulatory Visit: Payer: Self-pay

## 2022-09-30 ENCOUNTER — Telehealth: Payer: Self-pay | Admitting: Internal Medicine

## 2022-09-30 ENCOUNTER — Telehealth: Payer: Self-pay

## 2022-09-30 DIAGNOSIS — Z86010 Personal history of colonic polyps: Secondary | ICD-10-CM

## 2022-09-30 DIAGNOSIS — Z7901 Long term (current) use of anticoagulants: Secondary | ICD-10-CM

## 2022-09-30 NOTE — Telephone Encounter (Signed)
[facility] Pre-operative Risk Assessment     Request for surgical clearance:     Endoscopy Procedure  What type of surgery is being performed?     Colonoscopy   When is this surgery scheduled?     12/16/2022  What type of clearance is required ?   Medical/Pharmacy  Are there any medications that need to be held prior to surgery and how long? Eliquis /2 days   Practice name and name of physician performing surgery?      [facility]/ Dr. Leone Payor  What is your office phone and fax number?      Phone- [phone]  Fax- [phone]  Anesthesia type (None, local, MAC, general) ?       MAC

## 2022-09-30 NOTE — Telephone Encounter (Signed)
Inbound call from patient requesting a call back from [place]. Patient is now ready to continue scheduling colonoscopy and has had his Echo completed with his cardiologist. Patient is still taking blood thinner. Requesting a call back regarding scheduling. Please advise, thank you.

## 2022-09-30 NOTE — Telephone Encounter (Signed)
Pt made aware of Dr. Leone Payor recommendations.  Pt was scheduled for his colonoscopy  12/16/2022 at 8:30 AM at [facility] Pt made aware. Case ID number [id number] Pt was scheduled for a previsit on 10/28/2022 at 12:30 PM. Pt made aware. Location provided. Clearance sent to Cardiology for clearance and hold on Eliquis 2 days prior to procedure.  Pt verbalized understanding with all questions answered.

## 2022-09-30 NOTE — Telephone Encounter (Signed)
Ejection fraction is still below 35% (20 to 25%)  His colonoscopy will need to be done at the hospital as an outpatient.  We need clearance from his cardiologist to hold Eliquis 2 days prior to the procedure   We can use August 15   Please let me know the outcome of the appointment scheduling if it is not August 15 we will have to move on to September 24

## 2022-09-30 NOTE — Telephone Encounter (Signed)
Pt stated that he recently had his echo done and is ready to schedule his procedure.  Results are in Care Everywhere.  Please advise on scheduling pt procedure and if pt is to be done in Hospital and needs to be placed on wait list.

## 2022-10-01 ENCOUNTER — Telehealth: Payer: Self-pay

## 2022-10-01 NOTE — Telephone Encounter (Signed)
Noted Clearance will be sent to pt cardiologist in separate note.

## 2022-10-01 NOTE — Telephone Encounter (Signed)
Routed back to Emeline Darling, RN, to make him aware.

## 2022-10-01 NOTE — Telephone Encounter (Signed)
[facility] Pre-operative Risk Assessment     Request for surgical clearance:     Endoscopy Procedure  What type of surgery is being performed?     Colonoscopy   When is this surgery scheduled?     12/16/2022  What type of clearance is required ?   Medical/Pharmacy  Are there any medications that need to be held prior to surgery and how long? Eliquis /2 days   Practice name and name of physician performing surgery?      [facility]/ Dr. Gessner  What is your office phone and fax number?      Phone- [phone]  Fax- [phone]  Anesthesia type (None, local, MAC, general) ?       MAC  

## 2022-10-01 NOTE — Telephone Encounter (Signed)
Pt does not follow with [facility], has never had appt with [place]. Clearance should come from managing provider.

## 2022-10-16 NOTE — Telephone Encounter (Signed)
Jackelyn Hoehn,  I am just following up. I see clearance has been sent for this patient Eliquis hold. Have you heard anything back? Just wanting to be sure I am not missing something.   Thank you,  Pre visit

## 2022-10-20 NOTE — Telephone Encounter (Signed)
Chart reviewed and noted that clearance has not been received. Clearance request was sent once  again to Dr. Clydie Braun.  Fax Number [phone]: Phone Number [phone]:

## 2022-10-22 NOTE — Telephone Encounter (Signed)
Received Fax from Dr. Sampson Goon office stating that they are   Currently in the process of assessing him for a pacemaker upgrade and possible defibrillator and that they recommend keeping the scheduled procedure as he should be done with everything in time to hold eliquis 48 hours prior to his colonoscopy  but would hold off on risk assessment approval until the beginning of September.   Copy of faxed was made and sent to be scanned in and a copy placed on Dr. Leone Payor desk as Lorain Childes. Reminder was placed in Epic to follow up on risk assessment in the beginning for September.

## 2022-10-28 ENCOUNTER — Ambulatory Visit (AMBULATORY_SURGERY_CENTER): Payer: 59

## 2022-10-28 VITALS — Ht 64.0 in | Wt 178.4 lb

## 2022-10-28 DIAGNOSIS — Z86010 Personal history of colonic polyps: Secondary | ICD-10-CM

## 2022-10-28 NOTE — Progress Notes (Signed)
No egg or soy allergy known to patient  No issues known to pt with past sedation with any surgeries or procedures Patient denies ever being told they had issues or difficulty with intubation  No FH of Malignant Hyperthermia Pt is not on diet pills Pt is not on  home 02  Pt is not on blood thinners  Pt denies issues with constipation  No A fib or A flutter Have any cardiac testing pending--no  LOA: independent  Prep: Miralax   Pt present to PV alone poor historian in regards to medications. Diabetic instructions given to patient for each medication listed on his chart as well as instructions for blood thinner hold.   PV competed with patient. Prep instructions given to patient at [facility] apt. Pt reports no questions at this time. Office number given.

## 2022-11-25 ENCOUNTER — Telehealth: Payer: Self-pay

## 2022-11-25 NOTE — Telephone Encounter (Signed)
-----   Message from Nurse Joselyn Glassman sent at 10/22/2022  1:07 PM EDT ----- Personal reminder sent on 10/22/2022   Received Fax from Dr. Sampson Goon office stating that they are    Currently in the process of assessing him for a pacemaker upgrade and possible defibrillator and that they recommend keeping the scheduled procedure as he should be done with everything in time to hold eliquis 48 hours prior to his colonoscopy  but would hold off on risk assessment approval until the beginning of September.    Copy of faxed was made and sent to be scanned in and a copy placed on Dr. Leone Payor desk as Lorain Childes. Reminder was placed in Epic to follow up on risk assessment in the beginning for September.

## 2022-11-25 NOTE — Telephone Encounter (Signed)
Reminder was received in Epic.  Clearance request was faxed to Dr. Sampson Goon [phone].  Conformation page was received that fax was sent successfully.

## 2022-12-02 ENCOUNTER — Telehealth: Payer: Self-pay

## 2022-12-02 NOTE — Telephone Encounter (Signed)
Pt was notified that we had received the clearance for him to hold his anticoagulant for 48 hours prior to procedure.  Pt stated that he was already aware. Pt stated that he lost  his prep instructions. Pt was notified that I would print him off new ones and he could stop by on the second floor to pick them up. Pt verbalized understanding with all questions answered.

## 2022-12-02 NOTE — Telephone Encounter (Signed)
Received clearance from pt Cardiologist office for pt to hold anticoagulation for 48 hours prior to the procedure. Copies made and copy placed on Dr. Leone Payor desk and copy sent to be scanned into Epic.  Unable to reach pt. Unable to leave voice message.

## 2022-12-09 NOTE — Progress Notes (Addendum)
Anesthesia Review:  PCP: Cardiologist :Howell Pringle- LOV 11/25/22  Chest x-ray : EKG : Echo : Stress test: Cardiac Cath :  Activity level:  Sleep Study/ CPAP : Fasting Blood Sugar :      / Checks Blood Sugar -- times a day:   Blood Thinner/ Instructions /Last Dose: ASA / Instructions/ Last Dose :    Eliquis-  Pacemaker  Left Hemiplegia    DM- type 2 Freestyle LIbre  Lantus-  Lewvemir-  Metformin-  Ozempic  Januvia-

## 2022-12-15 NOTE — Anesthesia Preprocedure Evaluation (Signed)
Anesthesia Evaluation  Patient identified by MRN, date of birth, ID band Patient awake    Reviewed: Allergy & Precautions, NPO status , Patient's Chart, lab work & pertinent test results  Airway Mallampati: II  TM Distance: >3 FB Neck ROM: Full    Dental no notable dental hx. (+) Missing   Pulmonary asthma , Current SmokerPatient did not abstain from smoking.   Pulmonary exam normal breath sounds clear to auscultation       Cardiovascular hypertension, Pt. on medications + Past MI  Normal cardiovascular exam+ pacemaker  Rhythm:Regular Rate:Normal     Neuro/Psych    GI/Hepatic   Endo/Other  diabetes, Type 2, Oral Hypoglycemic Agents    Renal/GU      Musculoskeletal   Abdominal   Peds  Hematology   Anesthesia Other Findings   Reproductive/Obstetrics                             Anesthesia Physical Anesthesia Plan  ASA: 2  Anesthesia Plan: MAC   Post-op Pain Management: Minimal or no pain anticipated   Induction:   PONV Risk Score and Plan: 1 and Treatment may vary due to age or medical condition and Propofol infusion  Airway Management Planned: Natural Airway and Nasal Cannula  Additional Equipment: None  Intra-op Plan:   Post-operative Plan:   Informed Consent: I have reviewed the patients History and Physical, chart, labs and discussed the procedure including the risks, benefits and alternatives for the proposed anesthesia with the patient or authorized representative who has indicated his/her understanding and acceptance.     Dental advisory given  Plan Discussed with: CRNA  Anesthesia Plan Comments: (Colonoscopy for hx of polyps)        Anesthesia Quick Evaluation

## 2022-12-16 ENCOUNTER — Ambulatory Visit (HOSPITAL_BASED_OUTPATIENT_CLINIC_OR_DEPARTMENT_OTHER): Payer: 59 | Admitting: Anesthesiology

## 2022-12-16 ENCOUNTER — Other Ambulatory Visit: Payer: Self-pay

## 2022-12-16 ENCOUNTER — Ambulatory Visit (HOSPITAL_COMMUNITY)
Admission: RE | Admit: 2022-12-16 | Discharge: 2022-12-16 | Disposition: A | Discharge status: Home / Self Care | Payer: 59 | Attending: Internal Medicine | Admitting: Internal Medicine

## 2022-12-16 ENCOUNTER — Ambulatory Visit (HOSPITAL_COMMUNITY): Payer: 59 | Admitting: Anesthesiology

## 2022-12-16 ENCOUNTER — Encounter (HOSPITAL_COMMUNITY): Payer: Self-pay | Admitting: Internal Medicine

## 2022-12-16 ENCOUNTER — Encounter (HOSPITAL_COMMUNITY)
Admission: RE | Disposition: A | Discharge status: Home / Self Care | Payer: Self-pay | Source: Home / Self Care | Attending: Internal Medicine

## 2022-12-16 DIAGNOSIS — Z950 Presence of cardiac pacemaker: Secondary | ICD-10-CM | POA: Insufficient documentation

## 2022-12-16 DIAGNOSIS — Z833 Family history of diabetes mellitus: Secondary | ICD-10-CM | POA: Insufficient documentation

## 2022-12-16 DIAGNOSIS — Z79899 Other long term (current) drug therapy: Secondary | ICD-10-CM | POA: Insufficient documentation

## 2022-12-16 DIAGNOSIS — I10 Essential (primary) hypertension: Secondary | ICD-10-CM | POA: Insufficient documentation

## 2022-12-16 DIAGNOSIS — F17210 Nicotine dependence, cigarettes, uncomplicated: Secondary | ICD-10-CM | POA: Insufficient documentation

## 2022-12-16 DIAGNOSIS — Z7901 Long term (current) use of anticoagulants: Secondary | ICD-10-CM | POA: Diagnosis not present

## 2022-12-16 DIAGNOSIS — Z1211 Encounter for screening for malignant neoplasm of colon: Secondary | ICD-10-CM | POA: Insufficient documentation

## 2022-12-16 DIAGNOSIS — E119 Type 2 diabetes mellitus without complications: Secondary | ICD-10-CM | POA: Insufficient documentation

## 2022-12-16 DIAGNOSIS — Z8673 Personal history of transient ischemic attack (TIA), and cerebral infarction without residual deficits: Secondary | ICD-10-CM | POA: Diagnosis not present

## 2022-12-16 DIAGNOSIS — K5730 Diverticulosis of large intestine without perforation or abscess without bleeding: Secondary | ICD-10-CM | POA: Diagnosis not present

## 2022-12-16 DIAGNOSIS — K624 Stenosis of anus and rectum: Secondary | ICD-10-CM | POA: Diagnosis not present

## 2022-12-16 DIAGNOSIS — Z86010 Personal history of colonic polyps: Secondary | ICD-10-CM | POA: Diagnosis not present

## 2022-12-16 DIAGNOSIS — Z09 Encounter for follow-up examination after completed treatment for conditions other than malignant neoplasm: Secondary | ICD-10-CM | POA: Diagnosis not present

## 2022-12-16 DIAGNOSIS — Z7984 Long term (current) use of oral hypoglycemic drugs: Secondary | ICD-10-CM | POA: Diagnosis not present

## 2022-12-16 DIAGNOSIS — J45909 Unspecified asthma, uncomplicated: Secondary | ICD-10-CM | POA: Insufficient documentation

## 2022-12-16 DIAGNOSIS — K5790 Diverticulosis of intestine, part unspecified, without perforation or abscess without bleeding: Secondary | ICD-10-CM

## 2022-12-16 DIAGNOSIS — Z794 Long term (current) use of insulin: Secondary | ICD-10-CM | POA: Diagnosis not present

## 2022-12-16 DIAGNOSIS — Z860101 Personal history of adenomatous and serrated colon polyps: Secondary | ICD-10-CM

## 2022-12-16 DIAGNOSIS — I252 Old myocardial infarction: Secondary | ICD-10-CM | POA: Insufficient documentation

## 2022-12-16 HISTORY — PX: COLONOSCOPY WITH PROPOFOL: SHX5780

## 2022-12-16 LAB — GLUCOSE, CAPILLARY: Glucose-Capillary: 140 mg/dL — ABNORMAL HIGH (ref 70–99)

## 2022-12-16 SURGERY — COLONOSCOPY WITH PROPOFOL
Anesthesia: Monitor Anesthesia Care

## 2022-12-16 MED ORDER — LACTATED RINGERS IV SOLN: INTRAVENOUS | Status: DC

## 2022-12-16 MED ORDER — SODIUM CHLORIDE 0.9 % IV SOLN: INTRAVENOUS | Status: DC

## 2022-12-16 MED ORDER — PROPOFOL 500 MG/50ML IV EMUL
INTRAVENOUS | Status: DC | PRN
  Administered 2022-12-16 (×2): 20 mg via INTRAVENOUS
  Administered 2022-12-16: 125 ug/kg/min via INTRAVENOUS

## 2022-12-16 MED ORDER — LIDOCAINE 2% (20 MG/ML) 5 ML SYRINGE
INTRAMUSCULAR | Status: DC | PRN
Start: 2022-12-16 — End: 2022-12-16
  Administered 2022-12-16: 60 mg via INTRAVENOUS

## 2022-12-16 SURGICAL SUPPLY — 22 items
ELECT REM PT RETURN 9FT ADLT (ELECTROSURGICAL)
ELECTRODE REM PT RTRN 9FT ADLT (ELECTROSURGICAL) IMPLANT
FCP BXJMBJMB 240X2.8X (CUTTING FORCEPS)
FLOOR PAD 36X40 (MISCELLANEOUS) ×1
FORCEPS BIOP RAD 4 LRG CAP 4 (CUTTING FORCEPS) IMPLANT
FORCEPS BIOP RJ4 240 W/NDL (CUTTING FORCEPS)
FORCEPS BXJMBJMB 240X2.8X (CUTTING FORCEPS) IMPLANT
INJECTOR/SNARE I SNARE (MISCELLANEOUS) IMPLANT
LUBRICANT JELLY 4.5OZ STERILE (MISCELLANEOUS) IMPLANT
MANIFOLD NEPTUNE II (INSTRUMENTS) IMPLANT
NDL SCLEROTHERAPY 25GX240 (NEEDLE) IMPLANT
NEEDLE SCLEROTHERAPY 25GX240 (NEEDLE)
PAD FLOOR 36X40 (MISCELLANEOUS) ×1 IMPLANT
PROBE APC STR FIRE (PROBE) IMPLANT
PROBE INJECTION GOLD (MISCELLANEOUS)
PROBE INJECTION GOLD 7FR (MISCELLANEOUS) IMPLANT
SNARE ROTATE MED OVAL 20MM (MISCELLANEOUS) IMPLANT
SYR 50ML LL SCALE MARK (SYRINGE) IMPLANT
TRAP SPECIMEN MUCOUS 40CC (MISCELLANEOUS) IMPLANT
TUBING ENDO SMARTCAP PENTAX (MISCELLANEOUS) IMPLANT
TUBING IRRIGATION ENDOGATOR (MISCELLANEOUS) ×1 IMPLANT
WATER STERILE IRR 1000ML POUR (IV SOLUTION) IMPLANT

## 2022-12-16 NOTE — Anesthesia Postprocedure Evaluation (Signed)
Anesthesia Post Note  Patient: Caleb Hernandez  Procedure(s) Performed: COLONOSCOPY WITH PROPOFOL     Patient location during evaluation: Endoscopy Anesthesia Type: MAC Level of consciousness: awake and alert Pain management: pain level controlled Vital Signs Assessment: post-procedure vital signs reviewed and stable Respiratory status: spontaneous breathing, nonlabored ventilation, respiratory function stable and patient connected to nasal cannula oxygen Cardiovascular status: blood pressure returned to baseline and stable Postop Assessment: no apparent nausea or vomiting Anesthetic complications: no   No notable events documented.  Last Vitals:  Vitals:   12/16/22 0840 12/16/22 0848  BP: 119/73 138/81  Pulse: 85   Resp: 16   Temp:    SpO2: 96%     Last Pain:  Vitals:   12/16/22 0837  TempSrc:   PainSc: 0-No pain                 Trevor Iha

## 2022-12-16 NOTE — Transfer of Care (Signed)
Immediate Anesthesia Transfer of Care Note  Patient: Caleb Hernandez  Procedure(s) Performed: COLONOSCOPY WITH PROPOFOL  Patient Location: PACU  Anesthesia Type:MAC  Level of Consciousness: awake, alert , and oriented  Airway & Oxygen Therapy: Patient Spontanous Breathing and Patient connected to face mask oxygen  Post-op Assessment: Report given to RN and Post -op Vital signs reviewed and stable  Post vital signs: Reviewed and stable  Last Vitals:  Vitals Value Taken Time  BP    Temp    Pulse    Resp    SpO2      Last Pain:  Vitals:   12/16/22 0718  TempSrc: Temporal         Complications: No notable events documented.

## 2022-12-16 NOTE — H&P (Signed)
[facility] History and Physical   Primary Care Physician:  Kallie Locks, FNP   Reason for Procedure:   Hx colon polyps  Plan:    colonoscopy     HPI: Caleb Hernandez is a 62 y.o. male w/ hx adenomatous colon polyps here for surveillance exam - has low EF and requires hospital setting.  He is on Eliquis and has held x 2 days  Polyp history: 2 subcentimeter adenomas screening colonoscopy September 2013, [facility] 09/27/2018 5 diminutive polyps removed. 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) 09/26/2016   Corns and callosity 08/28/2012   Diabetes mellitus without complication (HCC)    Diverticulitis 09/17/2016   Diverticulitis 09/08/2019   Early cataracts, bilateral 01/03/2014   Encounter for long-term (current) use of other medications 08/28/2012   Essential hypertension 09/17/2016   First degree heart block 08/28/2012   GI bleed 09/17/2016   High cholesterol    Hx of adenomatous polyps of colon 09/09/2018   Hypercholesterolemia 04/13/2013   Hypertension    Increased band cell count 09/26/2016   Male hypogonadism 11/16/2013   Myocardial infarction (HCC)    Nondependent alcohol abuse 09/17/2016   Onychomycosis of toenail 12/18/2014   Positive RPR test 09/27/2016   Premature beats 08/28/2012   Proteinuria 04/13/2013   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/2013   Tinea pedis 08/28/2012   Tinea pedis of both feet 12/18/2014   Tobacco use 09/17/2016   Type 2 diabetes mellitus, without long-term current use of insulin (HCC) 09/17/2016   Undiagnosed cardiac murmurs 04/20/2012   Vitamin D deficiency 04/13/2013    Past Surgical History:  Procedure Laterality Date   BACK SURGERY     COLONOSCOPY   2013   LOOP RECORDER INSERTION N/A 10/17/2016   Procedure: Loop Recorder Insertion;  Surgeon: Marinus Maw, MD;  Location: [facility] INVASIVE [facility];  Service: Cardiovascular;  Laterality: N/A;   PACEMAKER IMPLANT     June 2018 or June 2019    Prior to Admission medications   Medication Sig Start Date End Date Taking? Authorizing Provider  albuterol (VENTOLIN HFA) 108 (90 Base) MCG/ACT inhaler Inhale into the lungs every 4 (four) hours as needed for wheezing or shortness of breath. 05/26/22  Yes [provider]  amLODipine (NORVASC) 5 MG tablet Take 5 mg by mouth daily.   Yes [provider]  atorvastatin (LIPITOR) 10 MG tablet Take 10 mg by mouth daily.   Yes [provider]  cholecalciferol (VITAMIN D3) 25 MCG (1000 UNIT) tablet Take 1,000 Units by mouth daily. 11/12/20  Yes [provider]  flecainide (TAMBOCOR) 50 MG tablet Take 50 mg by mouth every 12 (twelve) hours. 11/12/20  Yes [provider]  folic acid (FOLVITE) 1 MG tablet Take 1 tablet (1 mg total) by mouth daily. 01/06/20  Yes Kallie Locks, FNP  lisinopril (ZESTRIL) 30 MG tablet Take 30 mg by mouth daily.   Yes [provider]  Magnesium 250 MG TABS Take 1 tablet by mouth daily.   Yes [provider]  metFORMIN (GLUCOPHAGE) 500 MG tablet Take 2 tablets (1,000 mg total) by mouth 2 (two) times daily with a meal. 12/02/19  Yes Kallie Locks, FNP  Omega-3 Fatty Acids (FISH OIL) 600 MG CAPS  Take 2 capsules by mouth 2 (two) times daily.   Yes [provider]  pantoprazole (PROTONIX) 40 MG tablet TAKE 1 TABLET BY MOUTH ONCE A DAY 01/06/20 12/16/22 Yes Kallie Locks, FNP  Thiamine HCl (VITAMIN B-1) 250 MG tablet Take 250 mg by mouth daily.   Yes [provider]  Vitamin D, Ergocalciferol, (DRISDOL) 1.25 MG (50000 UNIT) CAPS capsule TAKE 1 CAPSULE BY MOUTH EVERY 7 DAYS. 10/26/19  Yes Kallie Locks, FNP  apixaban (ELIQUIS) 5 MG TABS tablet TAKE 1 TABLET (5  MG TOTAL) BY MOUTH 2 (TWO) TIMES DAILY. 11/16/19 10/28/22  Kallie Locks, FNP  blood glucose meter kit and supplies KIT Dispense based on patient and insurance preference. Use up to four times daily as directed. (FOR ICD-10. E.11.8). 02/18/17   Bing Neighbors, NP  calcipotriene (DOVONOX) 0.005 % cream Apply topically. 07/01/22   [provider]  Continuous Glucose Sensor (FREESTYLE LIBRE 2 SENSOR) MISC  10/21/22   [provider]  Continuous Glucose Sensor (FREESTYLE LIBRE 2 SENSOR) MISC  08/04/22   [provider]  dexlansoprazole (DEXILANT) 60 MG capsule Take 1 capsule by mouth daily. 05/28/22   [provider]  glucose blood (TRUE METRIX BLOOD GLUCOSE TEST) test strip USE AS DIRECTED UP TO 4 TIMES DAILY 10/29/18   Kallie Locks, FNP  insulin detemir (LEVEMIR) 100 UNIT/ML injection Inject 0.5 mLs (50 Units total) into the skin daily. 11/06/19   Kallie Locks, FNP  insulin glargine (LANTUS) 100 UNIT/ML injection Inject 60 Units into the skin at bedtime. 07/23/17   [provider]  Insulin Pen Needle (NOVOFINE) 30G X 8 MM MISC Inject 10 each into the skin as needed. 02/23/17   Bing Neighbors, NP  Insulin Syringe-Needle U-100 28G X 1/2" 1 ML MISC Use to administer insulin once daily. e11.8 02/24/17   Bing Neighbors, NP  OZEMPIC, 1 MG/DOSE, 4 MG/3ML SOPN Inject into the skin.    [provider]  sitaGLIPtin (JANUVIA) 25 MG tablet Take 25 mg by mouth daily. 02/26/17   [provider]  TRUEPLUS INSULIN SYRINGE 30G X 5/16" 1 ML MISC 30 mg. 02/12/18   [provider]  TRUEPLUS LANCETS 30G MISC USE AS DIRECTED UP TO FOUR TIMES DAILY 08/24/17   Quentin Angst, MD  varenicline (CHANTIX) 1 MG tablet Take 1 mg by mouth daily. 06/25/22   [provider]    Current Facility-Administered Medications  Medication Dose Route Frequency Provider Last Rate Last Admin   0.9 %  sodium chloride infusion   Intravenous Continuous  Iva Boop, MD        Allergies as of 09/30/2022   (No Known Allergies)    Family History  Problem Relation Age of Onset   Heart disease Brother    Diabetes Mother    Hypertension Mother    Diabetes Father    Hypercalcemia Father    Stomach cancer Brother    AAA (abdominal aortic aneurysm) Brother    Colon cancer Neg Hx    Esophageal cancer Neg Hx    Rectal cancer Neg Hx     Social History   Socioeconomic History   Marital status: Single    Spouse name: Not on file   Number of children: Not on file   Years of education: Not on file   Highest education level: Not on file  Occupational History   Not on file  Tobacco Use   Smoking status: Every Day  Types: Cigarettes   Smokeless tobacco: Never   Tobacco comments:    Smoke about 5 cigarrettes per day  Vaping Use   Vaping status: Never Used  Substance and Sexual Activity   Alcohol use: Yes    Comment: 40 oz per day / former per pt has cut back   Drug use: No   Sexual activity: Yes    Partners: Female  Other Topics Concern   Not on file  Social History Narrative   Single, unemployed   Uninsured   Hx EtOH   Smoker   No drugs   Social Determinants of Corporate investment banker Strain: Not on file  Food Insecurity: Not on file  Transportation Needs: Not on file  Physical Activity: Not on file  Stress: Not on file  Social Connections: Not on file  Intimate Partner Violence: Not on file    Review of Systems:  All other review of systems negative except as mentioned in the HPI.  Physical Exam: Vital signs BP 135/61   Pulse 97   Temp (!) 97.3 F (36.3 C) (Temporal)   Resp (!) 22   SpO2 98%   General:   Alert,  Well-developed, well-nourished, pleasant and cooperative in NAD Lungs:  Clear throughout to auscultation.   Heart:  Regular rate and rhythm; no murmurs, clicks, rubs,  or gallops. Abdomen:  Soft, nontender and nondistended. Normal bowel sounds.   Neuro/Psych:  Alert and cooperative.  Normal mood and affect. A and O x 3   @Jaren Kearn  Sena Slate, MD, [facility] [phone] (pager) 12/16/2022 7:25 AM@

## 2022-12-16 NOTE — Op Note (Signed)
[facility] Patient Name: Caleb Hernandez Procedure Date: 12/16/2022 MRN: [id number] Attending MD: Iva Boop , MD, [id number] Date of Birth: 11/24/1960 CSN: [id number] Age: 62 Admit Type: Outpatient Procedure:                Colonoscopy Indications:              Surveillance: Personal history of adenomatous                            polyps on last colonoscopy > 3 years ago, Last                            colonoscopy: 2020 Providers:                Iva Boop, MD, Fransisca Connors, Beryle Beams, Technician, Rutha Bouchard CRNA Referring MD:              Medicines:                Monitored Anesthesia Care Complications:            No immediate complications. Estimated Blood Loss:     Estimated blood loss: none. Procedure:                Pre-Anesthesia Assessment:                           - Prior to the procedure, a History and Physical                            was performed, and patient medications and                            allergies were reviewed. The patient's tolerance of                            previous anesthesia was also reviewed. The risks                            and benefits of the procedure and the sedation                            options and risks were discussed with the patient.                            All questions were answered, and informed consent                            was obtained. Prior Anticoagulants: The patient                            last took Eliquis (apixaban) 2 days prior to the  procedure. ASA Grade Assessment: II - A patient                            with mild systemic disease. After reviewing the                            risks and benefits, the patient was deemed in                            satisfactory condition to undergo the procedure.                           After obtaining informed consent, the colonoscope                            was  passed under direct vision. Throughout the                            procedure, the patient's blood pressure, pulse, and                            oxygen saturations were monitored continuously. The                            CF-HQ190L ([id number]) Olympus colonoscope was                            introduced through the anus and advanced to the the                            cecum, identified by appendiceal orifice and                            ileocecal valve. The colonoscopy was performed                            without difficulty. The patient tolerated the                            procedure well. The quality of the bowel                            preparation was good. The ileocecal valve,                            appendiceal orifice, and rectum were photographed.                            The bowel preparation used was Miralax via split                            dose instruction. Scope In: 8:15:46 AM Scope Out: 8:26:37 AM Scope Withdrawal Time: 0 hours 8 minutes 5 seconds  Total Procedure Duration: 0 hours 10 minutes  51 seconds  Findings:      The digital rectal exam findings include anal stricture.      Many diverticula were found in the entire colon.      The exam was otherwise without abnormality on direct and retroflexion       views. Impression:               - Anal stricture found on digital rectal exam.                            (mild stenosis of anus)                           - Diverticulosis in the entire examined colon.                           - The examination was otherwise normal on direct                            and retroflexion views.                           - No specimens collected.                           - Personal history of colonic polyps. 2                            subcentimeter adenomas screening colonoscopy                            September 2013, [facility]                           09/27/2018 5 diminutive polyps removed. 3+  adenomas Moderate Sedation:      Not Applicable - Patient had care per Anesthesia. Recommendation:           - Patient has a contact number available for                            emergencies. The signs and symptoms of potential                            delayed complications were discussed with the                            patient. Return to normal activities tomorrow.                            Written discharge instructions were provided to the                            patient.                           - Resume previous diet.                           -  Continue present medications.                           - Resume Eliquis (apixaban) at prior dose today.                           - Repeat colonoscopy in 5 years for surveillance.                            this procedure was performed in hospitral setting                            due to low EF Procedure Code(s):        --- Professional ---                           Z6109, Colorectal cancer screening; colonoscopy on                            individual at high risk Diagnosis Code(s):        --- Professional ---                           Z86.010, Personal history of colonic polyps                           K62.4, Stenosis of anus and rectum                           K57.30, Diverticulosis of large intestine without                            perforation or abscess without bleeding CPT copyright 2022 American Medical Association. All rights reserved. The codes documented in this report are preliminary and upon coder review may  be revised to meet current compliance requirements. Iva Boop, MD 12/16/2022 8:44:39 AM This report has been signed electronically. Number of Addenda: 0

## 2022-12-16 NOTE — Discharge Instructions (Signed)

## 2022-12-18 ENCOUNTER — Encounter (HOSPITAL_COMMUNITY): Payer: Self-pay | Admitting: Internal Medicine
